# Patient Record
Sex: Female | Born: 1940
Health system: Southern US, Community
[De-identification: ages and names within clinical notes are randomized; demographics above are authoritative.]

## PROBLEM LIST (undated history)

## (undated) DIAGNOSIS — G4733 Obstructive sleep apnea (adult) (pediatric): Secondary | ICD-10-CM

## (undated) DIAGNOSIS — I10 Essential (primary) hypertension: Secondary | ICD-10-CM

## (undated) DIAGNOSIS — K219 Gastro-esophageal reflux disease without esophagitis: Secondary | ICD-10-CM

## (undated) DIAGNOSIS — I4891 Unspecified atrial fibrillation: Secondary | ICD-10-CM

## (undated) DIAGNOSIS — I480 Paroxysmal atrial fibrillation: Secondary | ICD-10-CM

## (undated) DIAGNOSIS — K5790 Diverticulosis of intestine, part unspecified, without perforation or abscess without bleeding: Secondary | ICD-10-CM

## (undated) HISTORY — DX: Diverticulosis of intestine, part unspecified, without perforation or abscess without bleeding: K57.90

## (undated) HISTORY — PX: COLONOSCOPY: SHX174

## (undated) HISTORY — DX: Obstructive sleep apnea (adult) (pediatric): G47.33

## (undated) HISTORY — PX: ATRIAL FIBRILLATION ABLATION: EP1191

## (undated) HISTORY — DX: Gastro-esophageal reflux disease without esophagitis: K21.9

## (undated) HISTORY — DX: Essential (primary) hypertension: I10

## (undated) HISTORY — DX: Unspecified atrial fibrillation: I48.91

## (undated) HISTORY — DX: Paroxysmal atrial fibrillation: I48.0

---

## 1975-05-05 HISTORY — PX: CHOLECYSTECTOMY: SHX55

## 1983-05-05 HISTORY — PX: TOTAL ABDOMINAL HYSTERECTOMY: SHX209

## 2003-05-05 HISTORY — PX: COLON RESECTION: SHX5231

## 2016-11-18 ENCOUNTER — Encounter: Payer: Self-pay | Admitting: *Deleted

## 2016-12-02 ENCOUNTER — Encounter (INDEPENDENT_AMBULATORY_CARE_PROVIDER_SITE_OTHER): Payer: Self-pay

## 2016-12-02 ENCOUNTER — Ambulatory Visit (INDEPENDENT_AMBULATORY_CARE_PROVIDER_SITE_OTHER): Payer: Medicare PPO | Admitting: Internal Medicine

## 2016-12-02 ENCOUNTER — Encounter: Payer: Self-pay | Admitting: Nurse Practitioner

## 2016-12-02 ENCOUNTER — Encounter: Payer: Self-pay | Admitting: Internal Medicine

## 2016-12-02 VITALS — BP 138/82 | HR 69 | Ht 66.0 in | Wt 209.0 lb

## 2016-12-02 DIAGNOSIS — I480 Paroxysmal atrial fibrillation: Secondary | ICD-10-CM

## 2016-12-02 DIAGNOSIS — R002 Palpitations: Secondary | ICD-10-CM

## 2016-12-02 DIAGNOSIS — I10 Essential (primary) hypertension: Secondary | ICD-10-CM

## 2016-12-02 MED ORDER — METOPROLOL SUCCINATE ER 25 MG PO TB24: 25.0000 mg | ORAL_TABLET | Freq: Every day | ORAL | 3 refills | Status: DC

## 2016-12-02 NOTE — Progress Notes (Signed)
Electrophysiology Office Note   Date:  12/02/2016   ID:  Brenda DallasJean M. Dian [name], DOB 16-Mar-1941, MRN [id number]  PCP:  System, Pcp Not In  Primary Electrophysiologist: Hillis RangeJames Hermione Havlicek, MD    No chief complaint on file.    History of Present Illness: Brenda [name] is a 76 y.o. female who presents today for electrophysiology evaluation.   [place] recently [place] to be near her daughter and grandchildren.  She has a h/o afib and [facility].  She has done very well s/p AF ablation.  She has not had documented sustained afib.  She has been taken off of anticoagulation by her cardiologist in [place].  She has frequent palpitations which are short.  Prior monitoring has revealed only sinus with PACs.  She is quite concerned about having additional episodes of atrial fibrillation.  She presents to establish for long term cardiology management in our office.   Today, she denies symptoms of  chest pain, shortness of breath, orthopnea, PND, lower extremity edema, claudication, dizziness, presyncope, syncope, bleeding, or neurologic sequela. The patient is tolerating medications without difficulties and is otherwise without complaint today.    Past Medical History:  Diagnosis Date  . Diverticulosis   . GERD (gastroesophageal reflux disease)   . Hypertension   . OSA (obstructive sleep apnea)    severe  . Paroxysmal A-fib [facility](HCC)    [facility]   Past Surgical History:  Procedure Laterality Date  . ATRIAL [facility]BLATION     [employer]  . CHOLECYSTECTOMY  1977  . COLON RESECTION  2005   due to diverticulitis  . TOTAL ABDOMINAL HYSTERECTOMY  1985     Current Outpatient Prescriptions  Medication Sig Dispense Refill  . aspirin 81 MG tablet Take 81 mg by mouth daily.    . furosemide (LASIX) 20 MG tablet Take 20 mg by mouth daily as needed.    . loratadine (CLARITIN) 10 MG tablet Take 10 mg by mouth daily as needed for allergies.      Marland Kitchen. losartan (COZAAR) 25 MG tablet Take 25 mg by mouth 2 (two) times daily.    . metoprolol succinate (TOPROL-XL) 50 MG 24 hr tablet Take 25 mg by mouth 2 (two) times daily as needed. Take with or immediately following a meal.    . Naproxen Sodium (ALEVE) 220 MG CAPS Take by mouth. Uses as directed    . omeprazole (PRILOSEC) 20 MG capsule Take 20 mg by mouth daily.    . potassium citrate (UROCIT-K) 10 MEQ (1080 MG) SR tablet Take 10 mEq by mouth daily as needed (for numbness).     No current facility-administered medications for this visit.     Allergies:   Patient has no known allergies.   Social History:  The patient  reports that she does not drink alcohol or use drugs.   Family History:  The patient's  family history includes Parkinson's disease in her mother; Stroke in her mother.    ROS:  Please see the history of present illness.   All other systems are personally reviewed and negative.    PHYSICAL EXAM: VS:  BP 138/82   Pulse 69   Ht 5\' 6"  (1.676 m)   Wt 209 lb (94.8 kg)   SpO2 96%   BMI 33.73 kg/m  , BMI Body mass index is 33.73 kg/m. GEN: Well nourished, well developed, in no acute distress  HEENT: normal  Neck: no JVD,  carotid bruits, or masses Cardiac: RRR; no murmurs, rubs, or gallops,no edema  Respiratory:  clear to auscultation bilaterally, normal work of breathing GI: soft, nontender, nondistended, + BS MS: no deformity or atrophy  Skin: warm and dry  Neuro:  Strength and sensation are intact Psych: euthymic mood, full affect  EKG:  EKG is ordered today. The ekg ordered today is personally reviewed and shows low voltage, sinus 71 bpm, PR 174 msec, QRS 82 msec, Qtc 432 msec, anterior infarct pattern    Wt Readings from Last 3 Encounters:  12/02/16 209 lb (94.8 kg)      Other studies personally reviewed: Additional studies/ records that were reviewed today include: 88 pages from referring physician  Review of the above records today demonstrates: Echo  03/13/16 reveals EF 50-55%, mild MR, mild LA enlargement,  Nuclear stress test 12/12/14- EF 71%, no ischemia,  Event monitor 01/21/16- sinus with PACs,  Many symptomatic submissions corresponded to sinus rhythm   ASSESSMENT AND PLAN:  1.  Paroxysmal atrial fibrillation She has done very well post ablation She does have palpitations of unclear significance and is concerned about the possibility of further afib. Despite chads2vasc score of at least 3 (soon to be 4 with her age), her anticoagulation has been discontinued. I would advise long term monitoring with an implantable loop recorder to further evaluate/ characterize her palpitations post ablation and to better determine if she has afib.  If further afib is detected, then I would advise that she restart anticoagulation given her chads2vasc score.  Risks and benefits to implantable loop recorder placement were discussed in detail with the patient who wishes to proceed.    2. HTN Stable No change required today  Follow-up with me in 2 months  Current medicines are reviewed at length with the patient today.   The patient does not have concerns regarding her medicines.  The following changes were made today:  none    Signed, Hillis RangeJames Zachary Lovins, MD  12/02/2016 3:47 PM     [facility] [address] [place] [address] [phone] (office) [phone] (fax)

## 2016-12-02 NOTE — Patient Instructions (Addendum)
Medication Instructions:  DECREASE Toprol (Metoprolol) to 25 mg once daily   Labwork: None Ordered   Testing/Procedures: See letter   Follow-Up: Your physician recommends that you schedule a follow-up appointment in: 10 days for wound check   Your physician recommends that you schedule a follow-up appointment in: 2 months with Dr. Johney FrameAllred   If you need a refill on your cardiac medications before your next appointment, please call your pharmacy.   Thank you for choosing [facility]! Eligha BridegroomMichelle Swinyer, RN [phone]

## 2016-12-08 ENCOUNTER — Ambulatory Visit (HOSPITAL_COMMUNITY)
Admission: RE | Admit: 2016-12-08 | Discharge: 2016-12-08 | Disposition: A | Discharge status: Home / Self Care | Payer: Medicare PPO | Source: Ambulatory Visit | Attending: Internal Medicine | Admitting: Internal Medicine

## 2016-12-08 ENCOUNTER — Encounter (HOSPITAL_COMMUNITY)
Admission: RE | Disposition: A | Discharge status: Home / Self Care | Payer: Self-pay | Source: Ambulatory Visit | Attending: Internal Medicine

## 2016-12-08 ENCOUNTER — Encounter (HOSPITAL_COMMUNITY): Payer: Self-pay | Admitting: Internal Medicine

## 2016-12-08 DIAGNOSIS — G4733 Obstructive sleep apnea (adult) (pediatric): Secondary | ICD-10-CM | POA: Diagnosis not present

## 2016-12-08 DIAGNOSIS — I4891 Unspecified atrial fibrillation: Secondary | ICD-10-CM

## 2016-12-08 DIAGNOSIS — I480 Paroxysmal atrial fibrillation: Secondary | ICD-10-CM | POA: Diagnosis present

## 2016-12-08 DIAGNOSIS — I10 Essential (primary) hypertension: Secondary | ICD-10-CM | POA: Insufficient documentation

## 2016-12-08 DIAGNOSIS — K219 Gastro-esophageal reflux disease without esophagitis: Secondary | ICD-10-CM | POA: Insufficient documentation

## 2016-12-08 DIAGNOSIS — Z7982 Long term (current) use of aspirin: Secondary | ICD-10-CM | POA: Insufficient documentation

## 2016-12-08 HISTORY — PX: LOOP RECORDER INSERTION: EP1214

## 2016-12-08 SURGERY — LOOP RECORDER INSERTION

## 2016-12-08 MED ORDER — LIDOCAINE-EPINEPHRINE 1 %-1:100000 IJ SOLN
INTRAMUSCULAR | Status: AC
  Filled 2016-12-08: qty 1

## 2016-12-08 MED ORDER — LIDOCAINE-EPINEPHRINE 1 %-1:100000 IJ SOLN
INTRAMUSCULAR | Status: DC | PRN
  Administered 2016-12-08: 10 mL

## 2016-12-08 SURGICAL SUPPLY — 2 items
LOOP REVEAL LINQSYS (Prosthesis & Implant Heart) ×3 IMPLANT
PACK LOOP INSERTION (CUSTOM PROCEDURE TRAY) ×3 IMPLANT

## 2016-12-08 NOTE — Discharge Instructions (Signed)
Pt given discharge instructions verbally and in writing for loop recorder implant. Pt verbalizes understanding and denies further questions

## 2016-12-08 NOTE — Interval H&P Note (Signed)
History and Physical Interval Note:  12/08/2016 8:36 AM  Brenda [name]  has presented today for surgery, with the diagnosis of afib s/p ablation with subsequent recurrent palpitations.  The various methods of treatment have been discussed with the patient and family. After consideration of risks, benefits and other options for treatment, the patient has consented to  Procedure(s): LOOP RECORDER INSERTION (N/A) as a surgical intervention .  The patient's history has been reviewed, patient examined, no change in status, stable for surgery.  I have reviewed the patient's chart and labs.  Questions were answered to the patient's satisfaction.     Hillis RangeJames Zyonna Vardaman

## 2016-12-08 NOTE — H&P (View-Only) (Signed)
Electrophysiology Office Note   Date:  12/02/2016   ID:  Brenda [name], DOB 16-Mar-1941, MRN [id number]  PCP:  System, Pcp Not In  Primary Electrophysiologist: Hillis RangeJames Jedd Schulenburg, MD    No chief complaint on file.    History of Present Illness: Brenda [name] is a 76 y.o. female who presents today for electrophysiology evaluation.   [place] recently [place] to be near her daughter and grandchildren.  She has a h/o afib and [facility].  She has done very well s/p AF ablation.  She has not had documented sustained afib.  She has been taken off of anticoagulation by her cardiologist in [place].  She has frequent palpitations which are short.  Prior monitoring has revealed only sinus with PACs.  She is quite concerned about having additional episodes of atrial fibrillation.  She presents to establish for long term cardiology management in our office.   Today, she denies symptoms of  chest pain, shortness of breath, orthopnea, PND, lower extremity edema, claudication, dizziness, presyncope, syncope, bleeding, or neurologic sequela. The patient is tolerating medications without difficulties and is otherwise without complaint today.    Past Medical History:  Diagnosis Date  . Diverticulosis   . GERD (gastroesophageal reflux disease)   . Hypertension   . OSA (obstructive sleep apnea)    severe  . Paroxysmal A-fib [facility](HCC)    [facility]   Past Surgical History:  Procedure Laterality Date  . ATRIAL FIBRILLATION [facility]BLATION     [employer]  . CHOLECYSTECTOMY  1977  . COLON RESECTION  2005   due to diverticulitis  . TOTAL ABDOMINAL HYSTERECTOMY  1985     Current Outpatient Prescriptions  Medication Sig Dispense Refill  . aspirin 81 MG tablet Take 81 mg by mouth daily.    . furosemide (LASIX) 20 MG tablet Take 20 mg by mouth daily as needed.    . loratadine (CLARITIN) 10 MG tablet Take 10 mg by mouth daily as needed for allergies.      Marland Kitchen. losartan (COZAAR) 25 MG tablet Take 25 mg by mouth 2 (two) times daily.    . metoprolol succinate (TOPROL-XL) 50 MG 24 hr tablet Take 25 mg by mouth 2 (two) times daily as needed. Take with or immediately following a meal.    . Naproxen Sodium (ALEVE) 220 MG CAPS Take by mouth. Uses as directed    . omeprazole (PRILOSEC) 20 MG capsule Take 20 mg by mouth daily.    . potassium citrate (UROCIT-K) 10 MEQ (1080 MG) SR tablet Take 10 mEq by mouth daily as needed (for numbness).     No current facility-administered medications for this visit.     Allergies:   Patient has no known allergies.   Social History:  The patient  reports that she does not drink alcohol or use drugs.   Family History:  The patient's  family history includes Parkinson's disease in her mother; Stroke in her mother.    ROS:  Please see the history of present illness.   All other systems are personally reviewed and negative.    PHYSICAL EXAM: VS:  BP 138/82   Pulse 69   Ht 5\' 6"  (1.676 m)   Wt 209 lb (94.8 kg)   SpO2 96%   BMI 33.73 kg/m  , BMI Body mass index is 33.73 kg/m. GEN: Well nourished, well developed, in no acute distress  HEENT: normal  Neck: no JVD,  carotid bruits, or masses Cardiac: RRR; no murmurs, rubs, or gallops,no edema  Respiratory:  clear to auscultation bilaterally, normal work of breathing GI: soft, nontender, nondistended, + BS MS: no deformity or atrophy  Skin: warm and dry  Neuro:  Strength and sensation are intact Psych: euthymic mood, full affect  EKG:  EKG is ordered today. The ekg ordered today is personally reviewed and shows low voltage, sinus 71 bpm, PR 174 msec, QRS 82 msec, Qtc 432 msec, anterior infarct pattern    Wt Readings from Last 3 Encounters:  12/02/16 209 lb (94.8 kg)      Other studies personally reviewed: Additional studies/ records that were reviewed today include: 88 pages from referring physician  Review of the above records today demonstrates: Echo  03/13/16 reveals EF 50-55%, mild MR, mild LA enlargement,  Nuclear stress test 12/12/14- EF 71%, no ischemia,  Event monitor 01/21/16- sinus with PACs,  Many symptomatic submissions corresponded to sinus rhythm   ASSESSMENT AND PLAN:  1.  Paroxysmal atrial fibrillation She has done very well post ablation She does have palpitations of unclear significance and is concerned about the possibility of further afib. Despite chads2vasc score of at least 3 (soon to be 4 with her age), her anticoagulation has been discontinued. I would advise long term monitoring with an implantable loop recorder to further evaluate/ characterize her palpitations post ablation and to better determine if she has afib.  If further afib is detected, then I would advise that she restart anticoagulation given her chads2vasc score.  Risks and benefits to implantable loop recorder placement were discussed in detail with the patient who wishes to proceed.    2. HTN Stable No change required today  Follow-up with me in 2 months  Current medicines are reviewed at length with the patient today.   The patient does not have concerns regarding her medicines.  The following changes were made today:  none    Signed, Hillis RangeJames Lunabella Badgett, MD  12/02/2016 3:47 PM     [facility] [address] [place] [address] [phone] (office) [phone] (fax)

## 2016-12-21 ENCOUNTER — Ambulatory Visit (INDEPENDENT_AMBULATORY_CARE_PROVIDER_SITE_OTHER): Payer: Self-pay | Admitting: *Deleted

## 2016-12-21 DIAGNOSIS — R002 Palpitations: Secondary | ICD-10-CM

## 2016-12-22 LAB — CUP PACEART INCLINIC DEVICE CHECK
Implantable Pulse Generator Implant Date: 20180807
MDC IDC SESS DTM: 20180820195900

## 2016-12-22 NOTE — Progress Notes (Signed)
Wound check appointment. Steri-strips removed. Wound without redness or edema. Incision edges approximated, wound well healed. Battery status: GOOD. R-waves 0.41mV. 0 symptom episodes, 0 tachy episodes, 0 pause episodes, 0 brady episodes. 0 AF episodes (0% burden). Patient educated about wound care, symptom activator, and Carelink monitor. Monthly summary reports and ROV with JA 02/08/17.

## 2017-01-06 ENCOUNTER — Ambulatory Visit (INDEPENDENT_AMBULATORY_CARE_PROVIDER_SITE_OTHER): Payer: Medicare PPO | Admitting: Family Medicine

## 2017-01-06 ENCOUNTER — Encounter: Payer: Self-pay | Admitting: Family Medicine

## 2017-01-06 VITALS — BP 130/70 | HR 64 | Ht 65.0 in | Wt 207.0 lb

## 2017-01-06 DIAGNOSIS — I10 Essential (primary) hypertension: Secondary | ICD-10-CM

## 2017-01-06 DIAGNOSIS — K5790 Diverticulosis of intestine, part unspecified, without perforation or abscess without bleeding: Secondary | ICD-10-CM | POA: Insufficient documentation

## 2017-01-06 DIAGNOSIS — K219 Gastro-esophageal reflux disease without esophagitis: Secondary | ICD-10-CM | POA: Diagnosis not present

## 2017-01-06 DIAGNOSIS — Z13220 Encounter for screening for lipoid disorders: Secondary | ICD-10-CM

## 2017-01-06 DIAGNOSIS — Z0001 Encounter for general adult medical examination with abnormal findings: Secondary | ICD-10-CM

## 2017-01-06 DIAGNOSIS — G4733 Obstructive sleep apnea (adult) (pediatric): Secondary | ICD-10-CM | POA: Diagnosis not present

## 2017-01-06 DIAGNOSIS — E66811 Obesity, class 1: Secondary | ICD-10-CM

## 2017-01-06 DIAGNOSIS — Z0000 Encounter for general adult medical examination without abnormal findings: Secondary | ICD-10-CM | POA: Diagnosis not present

## 2017-01-06 DIAGNOSIS — E669 Obesity, unspecified: Secondary | ICD-10-CM

## 2017-01-06 DIAGNOSIS — I480 Paroxysmal atrial fibrillation: Secondary | ICD-10-CM

## 2017-01-06 LAB — COMPREHENSIVE METABOLIC PANEL
ALT: 21 U/L (ref 0–35)
AST: 19 U/L (ref 0–37)
Albumin: 4.2 g/dL (ref 3.5–5.2)
Alkaline Phosphatase: 77 U/L (ref 39–117)
BUN: 8 mg/dL (ref 6–23)
CALCIUM: 9.7 mg/dL (ref 8.4–10.5)
CO2: 26 meq/L (ref 19–32)
CREATININE: 0.87 mg/dL (ref 0.40–1.20)
Chloride: 105 mEq/L (ref 96–112)
GFR: 67.53 mL/min (ref >60.00)
Glucose, Bld: 90 mg/dL (ref 70–99)
Potassium: 4.3 mEq/L (ref 3.5–5.1)
SODIUM: 141 meq/L (ref 135–145)
Total Bilirubin: 0.5 mg/dL (ref 0.2–1.2)
Total Protein: 6.6 g/dL (ref 6.0–8.3)

## 2017-01-06 LAB — LIPID PANEL
CHOL/HDL RATIO: 4
Cholesterol: 207 mg/dL — ABNORMAL HIGH (ref 0–200)
HDL: 55.4 mg/dL (ref >39.00)
LDL Cholesterol: 125 mg/dL — ABNORMAL HIGH (ref 0–99)
NONHDL: 151.43
Triglycerides: 130 mg/dL (ref 0.0–149.0)
VLDL: 26 mg/dL (ref 0.0–40.0)

## 2017-01-06 LAB — LDL CHOLESTEROL, DIRECT: Direct LDL: 116 mg/dL

## 2017-01-06 LAB — CBC
HEMATOCRIT: 42.7 % (ref 36.0–46.0)
Hemoglobin: 14 g/dL (ref 12.0–15.0)
MCHC: 32.7 g/dL (ref 30.0–36.0)
MCV: 93.1 fl (ref 78.0–100.0)
PLATELETS: 216 10*3/uL (ref 150.0–400.0)
RBC: 4.58 Mil/uL (ref 3.87–5.11)
RDW: 13.2 % (ref 11.5–15.5)
WBC: 6.3 10*3/uL (ref 4.0–10.5)

## 2017-01-06 LAB — TSH: TSH: 3.41 u[IU]/mL (ref 0.35–4.50)

## 2017-01-06 LAB — HEMOGLOBIN A1C: HEMOGLOBIN A1C: 5.7 % (ref 4.6–6.5)

## 2017-01-06 NOTE — Assessment & Plan Note (Addendum)
Continue CPAP.  

## 2017-01-06 NOTE — Progress Notes (Signed)
Subjective:  Brenda [name] is a 76 y.o. female who presents today for her annual comprehensive physical exam and to establish care  HPI:  No acute complaints today. Her chronic medical conditions are summarized below:  Afib Chronic. Managed by cardiology. Underwent ablation 7 years ago. Currently has loop recorder in placed.  HTN Chronic. Well controlled. On losartan, lasix, and metoprolol.  OSA Diagnosed last year. Doing well on CPAP.  GERD Chronic. Stable on prilosec 10mg  daily.  Lifestyle Diet: Balanced. Plenty of fruits and vegetables.  Exercise: Walks daily. 12 miles a week.   Depression screen PHQ 2/9 01/06/2017  Decreased Interest 0  Down, Depressed, Hopeless 0  PHQ - 2 Score 0  Altered sleeping 0  Tired, decreased energy 1  Change in appetite 0  Feeling bad or failure about yourself  0  Trouble concentrating 0  Moving slowly or fidgety/restless 0  Suicidal thoughts 0  PHQ-9 Score 1  Difficult doing work/chores Not difficult at all   Health Maintenance Due  Topic Date Due  . TETANUS/TDAP  05/20/1961  . MAMMOGRAM  05/20/1992  . COLONOSCOPY  05/20/1992  . DEXA SCAN  05/21/2007  . PNA vac Low Risk Adult (1 of 2 - PCV13) 05/21/2007  . INFLUENZA VACCINE  12/02/2016     ROS: Occasional fatigue, otherwise a 14 point review of systems was performed and was negative  PMH:  The following were reviewed and entered/updated in epic: Past Medical History:  Diagnosis Date  . Diverticulosis   . GERD (gastroesophageal reflux disease)   . Hypertension   . OSA (obstructive sleep apnea)    severe  . Paroxysmal A-fib [facility](HCC)    [facility]   Patient Active Problem List   Diagnosis Date Noted  . Obesity (BMI 30.0-34.9) 01/06/2017  . Paroxysmal A-fib (HCC)   . OSA (obstructive sleep apnea)   . Hypertension   . GERD (gastroesophageal reflux disease)   . Diverticulosis    Past Surgical History:  Procedure Laterality Date  . ATRIAL FIBRILLATION  ABLATION     [employer]  . CHOLECYSTECTOMY  1977  . COLON RESECTION  2005   due to diverticulitis  . LOOP RECORDER INSERTION N/A 12/08/2016   Procedure: LOOP RECORDER INSERTION;  Surgeon: Hillis RangeAllred, James, MD;  Location: [facility];  Service: Cardiovascular;  Laterality: N/A;  . TOTAL ABDOMINAL HYSTERECTOMY  1985    Family History  Problem Relation Age of Onset  . Stroke Mother   . Parkinson's disease Mother     Medications- reviewed and updated Current Outpatient Prescriptions  Medication Sig Dispense Refill  . aspirin 81 MG tablet Take 81 mg by mouth daily.    . furosemide (LASIX) 20 MG tablet Take 20 mg by mouth daily as needed for fluid.     Marland Kitchen. ibuprofen (ADVIL,MOTRIN) 200 MG tablet Take 200 mg by mouth every 8 (eight) hours as needed for headache.    . loratadine (CLARITIN) 10 MG tablet Take 10 mg by mouth daily.     Marland Kitchen. losartan (COZAAR) 25 MG tablet Take 25 mg by mouth 2 (two) times daily.    . metoprolol succinate (TOPROL XL) 25 MG 24 hr tablet Take 1 tablet (25 mg total) by mouth daily. 90 tablet 3  . Naproxen Sodium (ALEVE) 220 MG CAPS Take 220 mg by mouth daily as needed (pain). Uses as directed     . omeprazole (PRILOSEC) 20 MG capsule Take 20 mg by mouth daily.    .Marland Kitchen  potassium citrate (UROCIT-K) 10 MEQ (1080 MG) SR tablet Take 10 mEq by mouth daily as needed (for numbness).     No current facility-administered medications for this visit.     Allergies-reviewed and updated No Known Allergies  Social History   Social History  . Marital status: Divorced    Spouse name: N/A  . Number of children: N/A  . Years of education: N/A   Social History Main Topics  . Smoking status: Not on file  . Smokeless tobacco: Not on file  . Alcohol use No  . Drug use: No  . Sexual activity: Not on file   Other Topics Concern  . Not on file   Social History Narrative   Recently moved to [place] from [place]   Retired Tourist information centre manager   Objective:  Physical  Exam: BP 130/70 (BP Location: Left Arm, Patient Position: Sitting, Cuff Size: Normal)   Pulse 64   Ht 5\' 5"  (1.651 m)   Wt 207 lb (93.9 kg)   SpO2 97%   BMI 34.45 kg/m   Body mass index is 34.45 kg/m. Gen: NAD, resting comfortably CV: RRR with no murmurs appreciated Pulm: NWOB, CTAB with no crackles, wheezes, or rhonchi GI: Normal bowel sounds present. Soft, Nontender, Nondistended. MSK: no edema, cyanosis, or clubbing noted Skin: warm, dry Neuro: grossly normal, moves all extremities Psych: Normal affect and thought content  Assessment/Plan:  Paroxysmal A-fib (HCC) Regular today. Continue ASA 81mg  and metoprolol. Defer further management to cardiology.   Hypertension At goal. Continue lasix, metoprolol, and losartan. Check CMET. Check A1c for diabetes screening.   OSA (obstructive sleep apnea) Continue CPAP.   GERD (gastroesophageal reflux disease) Continue omeprazole. Consider switching to H2 blocker in the future if continues to be well controlled on PPI.   Obesity (BMI 30.0-34.9) Encouraged exercise and a healthy diet. Will check lipid panel, A1c, and TSH today.   Preventative Healthcare: Patient declined flu shot today. Reports she had a normal colonoscopy 2 years ago and a normal mammogram last year. She is unsure if she has had a pneumonia vaccine or not. Also defers DEXA scan today. Will obtain prior records and update our EMR as needed. -Check lipid panel today.   Patient Counseling:  -Nutrition: Stressed importance of moderation in sodium/caffeine intake, saturated fat and cholesterol, caloric balance, sufficient intake of fresh fruits, vegetables, and fiber.  -Stressed the importance of regular exercise.   -Substance Abuse: Discussed cessation/primary prevention of tobacco, alcohol, or other drug use; driving or other dangerous activities under the influence; availability of treatment for abuse.   -Injury prevention: Discussed safety belts, safety helmets, smoke  detector, smoking near bedding or upholstery.   -Sexuality: Discussed sexually transmitted diseases, partner selection, use of condoms, avoidance of unintended pregnancy and contraceptive alternatives.   -Dental health: Discussed importance of regular tooth brushing, flossing, and dental visits.  -Health maintenance and immunizations reviewed. Please refer to Health maintenance section.  Return to care in 1 year for next preventative visit.   Katina Degree. Jimmey Ralph, MD 01/06/2017 10:33 AM

## 2017-01-06 NOTE — Patient Instructions (Signed)
Preventive Care 65 Years and Older, Female Preventive care refers to lifestyle choices and visits with your health care provider that can promote health and wellness. What does preventive care include?  A yearly physical exam. This is also called an annual well check.  Dental exams once or twice a year.  Routine eye exams. Ask your health care provider how often you should have your eyes checked.  Personal lifestyle choices, including: ? Daily care of your teeth and gums. ? Regular physical activity. ? Eating a healthy diet. ? Avoiding tobacco and drug use. ? Limiting alcohol use. ? Practicing safe sex. ? Taking low-dose aspirin every day. ? Taking vitamin and mineral supplements as recommended by your health care provider. What happens during an annual well check? The services and screenings done by your health care provider during your annual well check will depend on your age, overall health, lifestyle risk factors, and family history of disease. Counseling Your health care provider may ask you questions about your:  Alcohol use.  Tobacco use.  Drug use.  Emotional well-being.  Home and relationship well-being.  Sexual activity.  Eating habits.  History of falls.  Memory and ability to understand (cognition).  Work and work environment.  Reproductive health.  Screening You may have the following tests or measurements:  Height, weight, and BMI.  Blood pressure.  Lipid and cholesterol levels. These may be checked every 5 years, or more frequently if you are over 50 years old.  Skin check.  Lung cancer screening. You may have this screening every year starting at age 55 if you have a 30-pack-year history of smoking and currently smoke or have quit within the past 15 years.  Fecal occult blood test (FOBT) of the stool. You may have this test every year starting at age 50.  Flexible sigmoidoscopy or colonoscopy. You may have a sigmoidoscopy every 5 years or  a colonoscopy every 10 years starting at age 50.  Hepatitis C blood test.  Hepatitis B blood test.  Sexually transmitted disease (STD) testing.  Diabetes screening. This is done by checking your blood sugar (glucose) after you have not eaten for a while (fasting). You may have this done every 1-3 years.  Bone density scan. This is done to screen for osteoporosis. You may have this done starting at age 65.  Mammogram. This may be done every 1-2 years. Talk to your health care provider about how often you should have regular mammograms.  Talk with your health care provider about your test results, treatment options, and if necessary, the need for more tests. Vaccines Your health care provider may recommend certain vaccines, such as:  Influenza vaccine. This is recommended every year.  Tetanus, diphtheria, and acellular pertussis (Tdap, Td) vaccine. You may need a Td booster every 10 years.  Varicella vaccine. You may need this if you have not been vaccinated.  Zoster vaccine. You may need this after age 60.  Measles, mumps, and rubella (MMR) vaccine. You may need at least one dose of MMR if you were born in 1957 or later. You may also need a second dose.  Pneumococcal 13-valent conjugate (PCV13) vaccine. One dose is recommended after age 65.  Pneumococcal polysaccharide (PPSV23) vaccine. One dose is recommended after age 65.  Meningococcal vaccine. You may need this if you have certain conditions.  Hepatitis A vaccine. You may need this if you have certain conditions or if you travel or work in places where you may be exposed to hepatitis   A.  Hepatitis B vaccine. You may need this if you have certain conditions or if you travel or work in places where you may be exposed to hepatitis B.  Haemophilus influenzae type b (Hib) vaccine. You may need this if you have certain conditions.  Talk to your health care provider about which screenings and vaccines you need and how often you  need them. This information is not intended to replace advice given to you by your health care provider. Make sure you discuss any questions you have with your health care provider. Document Released: 05/17/2015 Document Revised: 01/08/2016 Document Reviewed: 02/19/2015 Elsevier Interactive Patient Education  2017 [employer].

## 2017-01-06 NOTE — Assessment & Plan Note (Addendum)
Encouraged exercise and a healthy diet. Will check lipid panel, A1c, and TSH today.

## 2017-01-06 NOTE — Assessment & Plan Note (Signed)
At goal. Continue lasix, metoprolol, and losartan. Check CMET. Check A1c for diabetes screening.

## 2017-01-06 NOTE — Assessment & Plan Note (Signed)
Regular today. Continue ASA 81mg  and metoprolol. Defer further management to cardiology.

## 2017-01-06 NOTE — Assessment & Plan Note (Signed)
Continue omeprazole. Consider switching to H2 blocker in the future if continues to be well controlled on PPI.

## 2017-01-07 ENCOUNTER — Ambulatory Visit (INDEPENDENT_AMBULATORY_CARE_PROVIDER_SITE_OTHER): Payer: Medicare PPO | Admitting: *Deleted

## 2017-01-07 ENCOUNTER — Telehealth: Payer: Self-pay | Admitting: Family Medicine

## 2017-01-07 DIAGNOSIS — I480 Paroxysmal atrial fibrillation: Secondary | ICD-10-CM | POA: Diagnosis not present

## 2017-01-07 MED ORDER — ATORVASTATIN CALCIUM 40 MG PO TABS: 40.0000 mg | ORAL_TABLET | Freq: Every day | ORAL | 3 refills | Status: DC

## 2017-01-07 NOTE — Telephone Encounter (Signed)
Patient does want to have something sent into the pharmacy for her high cholesterol that she was notified of in her lab results.  [facility] [id number] - [place] [id number] [facility] [address] & [facility] [address] [phone] (Phone) [phone] (Fax)   Call patient to advise with any further questions.

## 2017-01-07 NOTE — Telephone Encounter (Signed)
Please advise 

## 2017-01-07 NOTE — Telephone Encounter (Signed)
Rx for atorvastatin sent in.  Katina Degreealeb M. Jimmey RalphParker, MD 01/07/2017 2:57 PM

## 2017-01-07 NOTE — Telephone Encounter (Signed)
Pt is aware of annotations.  

## 2017-01-08 NOTE — Progress Notes (Signed)
Carelink Summary Report / Loop Recorder 

## 2017-01-12 LAB — CUP PACEART REMOTE DEVICE CHECK
Date Time Interrogation Session: 20180906121016
Implantable Pulse Generator Implant Date: 20180807

## 2017-01-19 ENCOUNTER — Telehealth: Payer: Self-pay | Admitting: Family Medicine

## 2017-01-19 NOTE — Telephone Encounter (Signed)
ROI faxed to Dr.Mathew [employer]

## 2017-01-28 NOTE — Telephone Encounter (Signed)
Rec'd from Dr. Karalee Height forwarded 4 pages to Ardith Dark MD

## 2017-02-08 ENCOUNTER — Encounter: Payer: Self-pay | Admitting: Internal Medicine

## 2017-02-08 ENCOUNTER — Ambulatory Visit (INDEPENDENT_AMBULATORY_CARE_PROVIDER_SITE_OTHER): Payer: Medicare PPO | Admitting: Internal Medicine

## 2017-02-08 VITALS — BP 120/76 | HR 70 | Ht 65.5 in | Wt 204.0 lb

## 2017-02-08 DIAGNOSIS — I480 Paroxysmal atrial fibrillation: Secondary | ICD-10-CM

## 2017-02-08 DIAGNOSIS — I10 Essential (primary) hypertension: Secondary | ICD-10-CM

## 2017-02-08 DIAGNOSIS — R002 Palpitations: Secondary | ICD-10-CM

## 2017-02-08 NOTE — Patient Instructions (Signed)
Medication Instructions:  Your physician recommends that you continue on your current medications as directed. Please refer to the Current Medication list given to you today.   Labwork: None ordered   Testing/Procedures: None ordered   Follow-Up: Remote monitoring is used to monitor your Pacemaker from home. This monitoring reduces the number of office visits required to check your device to one time per year. It allows [place] to keep an eye on the functioning of your device to ensure it is working properly. You are scheduled for a device check from home on 02/09/17. You may send your transmission at any time that day. If you have a wireless device, the transmission will be sent automatically. After your physician reviews your transmission, you will receive a postcard with your next transmission date.  Your physician wants you to follow-up in: 12 months with Dr. Johney Frame. You will receive a reminder letter in the mail two months in advance. If you don't receive a letter, please call our office to schedule the follow-up appointment.   A

## 2017-02-08 NOTE — Progress Notes (Signed)
   PCP: Ardith Dark, MD   Primary EP: Dr Higinio Plan. Brenda [name] is a 76 y.o. female who presents today for routine electrophysiology followup.  Since last being seen in our clinic, the patient reports doing very well.  Rare palpitations due to PACs.  Today, she denies symptoms of chest pain, shortness of breath,  lower extremity edema, dizziness, presyncope, or syncope.  The patient is otherwise without complaint today.   Past Medical History:  Diagnosis Date  . Diverticulosis   . GERD (gastroesophageal reflux disease)   . Hypertension   . OSA (obstructive sleep apnea)    severe  . Paroxysmal A-fib [facility])    s/p afib ablation at [facility]   Past Surgical History:  Procedure Laterality Date  . ATRIAL FIBRILLATION ABLATION     [employer]  . CHOLECYSTECTOMY  1977  . COLON RESECTION  2005   due to diverticulitis  . LOOP RECORDER INSERTION N/A 12/08/2016   Procedure: LOOP RECORDER INSERTION;  Surgeon: Hillis Range, MD;  Location: [facility] INVASIVE CV LAB;  Service: Cardiovascular;  Laterality: N/A;  . TOTAL ABDOMINAL HYSTERECTOMY  1985    ROS- all systems are reviewed and negatives except as per HPI above  Current Outpatient Prescriptions  Medication Sig Dispense Refill  . aspirin 81 MG tablet Take 81 mg by mouth daily.    Marland Kitchen atorvastatin (LIPITOR) 40 MG tablet Take 1 tablet (40 mg total) by mouth daily. 90 tablet 3  . furosemide (LASIX) 20 MG tablet Take 20 mg by mouth daily as needed for fluid.     Marland Kitchen ibuprofen (ADVIL,MOTRIN) 200 MG tablet Take 200 mg by mouth every 8 (eight) hours as needed for headache.    . loratadine (CLARITIN) 10 MG tablet Take 10 mg by mouth daily.     Marland Kitchen losartan (COZAAR) 25 MG tablet Take 25 mg by mouth 2 (two) times daily.    . metoprolol succinate (TOPROL XL) 25 MG 24 hr tablet Take 1 tablet (25 mg total) by mouth daily. 90 tablet 3  . Naproxen Sodium (ALEVE) 220 MG CAPS Take 220 mg by mouth daily as needed (pain). Uses as directed     . omeprazole (PRILOSEC)  20 MG capsule Take 20 mg by mouth daily.     No current facility-administered medications for this visit.     Physical Exam: Vitals:   02/08/17 0953  BP: 120/76  Pulse: 70  SpO2: 97%  Weight: 204 lb (92.5 kg)  Height: 5' 5.5" (1.664 m)    GEN- The patient is well appearing, alert and oriented x 3 today.   Head- normocephalic, atraumatic Eyes-  Sclera clear, conjunctiva pink Ears- hearing intact Oropharynx- clear Lungs- Clear to ausculation bilaterally, normal work of breathing Heart- Regular rate and rhythm, no murmurs, rubs or gallops, PMI not laterally displaced GI- soft, NT, ND, + BS Extremities- no clubbing, cyanosis, or edema  ILR is reviewed today is personally reviewed and shows no afib  Assessment and Plan:  1. Paroxysmal atrial fibrillation Doing very well s/p ablation No afib by ILR interrogation today chads2vasc score is 3.  She is clear in her desire to remain off of anticoagulation.  She may be more willing to consider if she has afib in the future.  2. HTN Stable No change required today  Carelink Return to see me in a year  Hillis Range MD, [facility] 02/08/2017 10:24 AM

## 2017-02-09 ENCOUNTER — Ambulatory Visit (INDEPENDENT_AMBULATORY_CARE_PROVIDER_SITE_OTHER): Payer: Medicare PPO | Admitting: *Deleted

## 2017-02-09 DIAGNOSIS — I480 Paroxysmal atrial fibrillation: Secondary | ICD-10-CM

## 2017-02-09 LAB — CUP PACEART INCLINIC DEVICE CHECK
Implantable Pulse Generator Implant Date: 20180807
MDC IDC SESS DTM: 20181008141948

## 2017-02-09 NOTE — Progress Notes (Signed)
error 

## 2017-02-10 NOTE — Progress Notes (Signed)
Carelink Summary Report / Loop Recorder 

## 2017-02-11 LAB — CUP PACEART REMOTE DEVICE CHECK
Implantable Pulse Generator Implant Date: 20180807
MDC IDC SESS DTM: 20181006124202

## 2017-03-08 ENCOUNTER — Ambulatory Visit (INDEPENDENT_AMBULATORY_CARE_PROVIDER_SITE_OTHER): Payer: Medicare PPO | Admitting: *Deleted

## 2017-03-08 DIAGNOSIS — I480 Paroxysmal atrial fibrillation: Secondary | ICD-10-CM

## 2017-03-08 NOTE — Progress Notes (Signed)
Carelink Summary Report / Loop Recorder 

## 2017-03-11 LAB — CUP PACEART REMOTE DEVICE CHECK
MDC IDC PG IMPLANT DT: 20180807
MDC IDC SESS DTM: 20181105131401

## 2017-04-07 ENCOUNTER — Ambulatory Visit (INDEPENDENT_AMBULATORY_CARE_PROVIDER_SITE_OTHER): Payer: Medicare PPO | Admitting: *Deleted

## 2017-04-07 DIAGNOSIS — I480 Paroxysmal atrial fibrillation: Secondary | ICD-10-CM | POA: Diagnosis not present

## 2017-04-07 NOTE — Progress Notes (Signed)
Carelink Summary Report / Loop Recorder 

## 2017-04-16 LAB — CUP PACEART REMOTE DEVICE CHECK
Implantable Pulse Generator Implant Date: 20180807
MDC IDC SESS DTM: 20181205144228

## 2017-05-07 ENCOUNTER — Ambulatory Visit (INDEPENDENT_AMBULATORY_CARE_PROVIDER_SITE_OTHER): Payer: Medicare PPO | Admitting: *Deleted

## 2017-05-07 DIAGNOSIS — I480 Paroxysmal atrial fibrillation: Secondary | ICD-10-CM

## 2017-05-07 NOTE — Progress Notes (Signed)
Carelink Summary Report / Loop Recorder 

## 2017-05-19 LAB — CUP PACEART REMOTE DEVICE CHECK
Date Time Interrogation Session: 20190104144113
MDC IDC PG IMPLANT DT: 20180807

## 2017-05-30 DIAGNOSIS — G4733 Obstructive sleep apnea (adult) (pediatric): Secondary | ICD-10-CM | POA: Diagnosis not present

## 2017-06-07 ENCOUNTER — Ambulatory Visit (INDEPENDENT_AMBULATORY_CARE_PROVIDER_SITE_OTHER): Payer: Medicare PPO | Admitting: *Deleted

## 2017-06-07 DIAGNOSIS — I480 Paroxysmal atrial fibrillation: Secondary | ICD-10-CM | POA: Diagnosis not present

## 2017-06-07 NOTE — Progress Notes (Signed)
Carelink Summary Reprot / Loop Recorder 

## 2017-06-25 ENCOUNTER — Telehealth: Payer: Self-pay

## 2017-06-25 ENCOUNTER — Other Ambulatory Visit: Payer: Self-pay

## 2017-06-25 DIAGNOSIS — G4733 Obstructive sleep apnea (adult) (pediatric): Secondary | ICD-10-CM

## 2017-06-25 NOTE — Telephone Encounter (Signed)
Referral has been placed. 

## 2017-06-25 NOTE — Telephone Encounter (Signed)
Ok with me. Please place any necessary orders. 

## 2017-06-25 NOTE — Telephone Encounter (Signed)
Copied from CRM [id number]. Topic: Referral - Request >> Jun 25, 2017 12:44 PM Yvonna Alanisobinson, Andra M wrote: Reason for CRM: Patient has requested for Dr. Jimmey RalphParker to send a referral to [facility] for patient to be seen there for Severe Sleep Apnea. Patient wants to be seen in April 2019. [place] Fax # [phone].

## 2017-06-29 LAB — CUP PACEART REMOTE DEVICE CHECK
Date Time Interrogation Session: 20190203154139
Implantable Pulse Generator Implant Date: 20180807

## 2017-06-30 DIAGNOSIS — G4733 Obstructive sleep apnea (adult) (pediatric): Secondary | ICD-10-CM | POA: Diagnosis not present

## 2017-07-06 ENCOUNTER — Encounter: Payer: Self-pay | Admitting: Family Medicine

## 2017-07-06 ENCOUNTER — Ambulatory Visit: Payer: Medicare PPO | Admitting: Family Medicine

## 2017-07-06 DIAGNOSIS — I10 Essential (primary) hypertension: Secondary | ICD-10-CM | POA: Diagnosis not present

## 2017-07-06 DIAGNOSIS — K219 Gastro-esophageal reflux disease without esophagitis: Secondary | ICD-10-CM | POA: Diagnosis not present

## 2017-07-06 DIAGNOSIS — M1990 Unspecified osteoarthritis, unspecified site: Secondary | ICD-10-CM | POA: Diagnosis not present

## 2017-07-06 MED ORDER — RANITIDINE HCL 150 MG PO TABS: 150.0000 mg | ORAL_TABLET | Freq: Two times a day (BID) | ORAL | 3 refills | Status: DC

## 2017-07-06 NOTE — Assessment & Plan Note (Signed)
Stop omeprazole.  Start ranitidine.  Discussed long-term consequences of chronic PPI use.

## 2017-07-06 NOTE — Assessment & Plan Note (Signed)
Borderline low today.  Stop losartan.  Continue Lasix 20 mg daily as well as metoprolol 25 mg daily.  Follow-up in 6 months.  Discussed home blood pressure monitoring with goal 140/90 or lower.

## 2017-07-06 NOTE — Patient Instructions (Signed)
You have arthritis in your knees and hips.  Please use Aleve as needed.  Please see physical therapy.  I can put in referral if you need to.  Sometimes we can do injections to help with your pain.  Please let us know if you would like to have this set up.  Stop losartan.  Please keep an eye on your blood pressure.  If it is persistently 140/90 or higher please let us know.  Stop the omeprazole.  I would like for you to start ranitidine 150 mg twice daily.  Let us know if this does not work for you.  We can restart the omeprazole.  Please come back to see me in 6 months, or sooner as needed.  Take care, Dr. Jimmey RalphParker

## 2017-07-06 NOTE — Progress Notes (Signed)
    Subjective:  Brenda [name] is a 77 y.o. female who presents today with a chief complaint of HTN follow up.   HPI:  HTN, Established Problem, Stable Current medications include losartan 25 mg daily, Lasix 20 mg daily, and metoprolol succinate 25 mg daily.  She is compliant with all his medications.  Home blood pressure readings are in the systolics 110s to 120s.  No chest pain or shortness of breath.  BP Readings from Last 3 Encounters:  07/06/17 106/74  02/08/17 120/76  01/06/17 130/70    GERD, established problem, stable On Prilosec 20 mg daily.  She has been on this for several years.  Symptoms are worse at night.  Lower extremity stiffness, new problem Several year history.  Worsened over last several weeks to months.  She has stiffness in both of her knees as well as her hips.  She occasionally takes Aleve which helps a little bit.  No pain.  No swelling.  No obvious alleviating or aggravating factors.  ROS: Per HPI  PMH: She reports that she has quit smoking. she has never used smokeless tobacco. She reports that she does not drink alcohol or use drugs.   Objective:  Physical Exam: BP 106/74 (BP Location: Left Arm, Patient Position: Sitting, Cuff Size: Large)   Pulse 86   Temp 98.7 F (37.1 C) (Oral)   Ht 5' 5.5" (1.664 m)   Wt 205 lb 6.4 oz (93.2 kg)   SpO2 95%   BMI 33.66 kg/m   Gen: NAD, resting comfortably CV: RRR with no murmurs appreciated Pulm: NWOB, CTAB with no crackles, wheezes, or rhonchi MSK: -Right lower extremity.  Limited range of motion with internal and external rotation of hip.  Significant crepitus with passive range of motion at knee.  No effusions noted.  Joint line nontender.  Neurovascularly intact distally. -Left lower extremity: Limited range of motion with internal and external rotation of hip.  Crepitus noted with passive range of motion at knee.  No effusion.  No joint line tenderness.  Neurovascularly intact distally.  Assessment/Plan:   Hypertension Borderline low today.  Stop losartan.  Continue Lasix 20 mg daily as well as metoprolol 25 mg daily.  Follow-up in 6 months.  Discussed home blood pressure monitoring with goal 140/90 or lower.  GERD (gastroesophageal reflux disease) Stop omeprazole.  Start ranitidine.  Discussed long-term consequences of chronic PPI use.  Osteoarthritis Physical exam consistent with bilateral hip and knee OA.  Discussed treatment options.  Patient's daughter is a physical therapist and she will be following up with her to discuss specific exercises.  Continue NSAIDs as needed.  Consider intra-articular steroid injections of pain as  Brenda Larock M. Jimmey RalphParker, MD 07/06/2017 3:29 PM

## 2017-07-06 NOTE — Assessment & Plan Note (Signed)
Physical exam consistent with bilateral hip and knee OA.  Discussed treatment options.  Patient's daughter is a physical therapist and she will be following up with her to discuss specific exercises.  Continue NSAIDs as needed.  Consider intra-articular steroid injections of pain as

## 2017-07-09 ENCOUNTER — Ambulatory Visit (INDEPENDENT_AMBULATORY_CARE_PROVIDER_SITE_OTHER): Payer: Medicare PPO | Admitting: *Deleted

## 2017-07-09 DIAGNOSIS — I480 Paroxysmal atrial fibrillation: Secondary | ICD-10-CM

## 2017-07-09 NOTE — Progress Notes (Signed)
Carelink Summary Report / Loop Recorder 

## 2017-08-06 DIAGNOSIS — L718 Other rosacea: Secondary | ICD-10-CM | POA: Diagnosis not present

## 2017-08-06 DIAGNOSIS — L308 Other specified dermatitis: Secondary | ICD-10-CM | POA: Diagnosis not present

## 2017-08-11 ENCOUNTER — Ambulatory Visit (INDEPENDENT_AMBULATORY_CARE_PROVIDER_SITE_OTHER): Payer: Medicare PPO | Admitting: *Deleted

## 2017-08-11 DIAGNOSIS — I480 Paroxysmal atrial fibrillation: Secondary | ICD-10-CM | POA: Diagnosis not present

## 2017-08-12 NOTE — Progress Notes (Signed)
Carelink Summary Report / Loop Recorder 

## 2017-08-13 ENCOUNTER — Encounter: Payer: Self-pay | Admitting: Pulmonary Disease

## 2017-08-13 ENCOUNTER — Ambulatory Visit: Payer: Medicare PPO | Admitting: Pulmonary Disease

## 2017-08-13 VITALS — BP 136/80 | HR 85 | Ht 65.0 in | Wt 205.4 lb

## 2017-08-13 DIAGNOSIS — Z9989 Dependence on other enabling machines and devices: Secondary | ICD-10-CM | POA: Diagnosis not present

## 2017-08-13 DIAGNOSIS — G4733 Obstructive sleep apnea (adult) (pediatric): Secondary | ICD-10-CM

## 2017-08-13 DIAGNOSIS — R0609 Other forms of dyspnea: Secondary | ICD-10-CM

## 2017-08-13 NOTE — Progress Notes (Signed)
   Subjective:    Patient ID: Brenda DallasJean M. [name], female    DOB: 1941-04-18, 77 y.o.   MRN: [id number]  HPI    Review of Systems  Constitutional: Negative for fever and unexpected weight change.  HENT: Negative for congestion, dental problem, ear pain, nosebleeds, postnasal drip, rhinorrhea, sinus pressure, sneezing, sore throat and trouble swallowing.   Eyes: Negative for redness and itching.  Respiratory: Positive for cough and shortness of breath. Negative for chest tightness and wheezing.   Cardiovascular: Positive for palpitations. Negative for leg swelling.  Gastrointestinal: Negative for nausea and vomiting.  Genitourinary: Negative for dysuria.  Musculoskeletal: Negative for joint swelling.  Skin: Negative for rash.  Allergic/Immunologic: Negative.  Negative for environmental allergies, food allergies and immunocompromised state.  Neurological: Negative for headaches.  Hematological: Does not bruise/bleed easily.  Psychiatric/Behavioral: Negative for dysphoric mood. The patient is not nervous/anxious.        Objective:   Physical Exam        Assessment & Plan:

## 2017-08-13 NOTE — Progress Notes (Signed)
[facility]  Chief Complaint  Patient presents with  . sleep consult    establish new sleep doctor since moving    Vital signs: BP 136/80 (BP Location: Left Arm, Cuff Size: Normal)   Pulse 85   Ht 5\' 5"  (1.651 m)   Wt 205 lb 6.4 oz (93.2 kg)   SpO2 96%   BMI 34.18 kg/m   History of Present Illness: Brenda [name] is a 77 y.o. female for evaluation of sleep problems.  She has a history of atrial fibrillation.  She was living in [place] and followed by Cardiology.  There was concern that she snored and would stop breathing while asleep.  She eventually had a sleep study in 2017.  She was found to have severe sleep apnea and started on CPAP.  She has been doing well with this.  She has auto CPAP and full face mask.  She needs to get a new DME company.  She goes to sleep at 8 pm.  She falls asleep in 5 minutes.  She wakes up 1 or 2 times to use the bathroom.  She gets out of bed at 5 am.  She feels rested in the morning.  She denies morning headache.  She does not use anything to help her fall sleep or stay awake.  She denies sleep walking, sleep talking, bruxism, or nightmares.  There is no history of restless legs.  She denies sleep hallucinations, sleep paralysis, or cataplexy.  The Epworth score is 0 out of 24.  She gets sinus congestion in the morning, but then is better later in the day.  She exercised daily by walking for about 2 miles.  She still gets winded at times with activity.    Physical Exam:  General - pleasant Eyes - pupils reactive ENT - no sinus tenderness, no oral exudate, no LAN, MP 3, over bite Cardiac - regular, no murmur Chest - no wheeze, rales Abd - soft, non tender Ext - no edema Skin - no rashes Neuro - normal strength Psych - normal mood   Assessment/Plan:  Obstructive sleep apnea. - she is compliant with CPAP and reports benefit - will change her auto CPAP to 5 to 18 cm H2O  CPAP rhinitis. -  discussed environmental control techniques - she can try adjusting temperature on her humidifier  Dyspnea on exertion. - she is being assessed by cardiology - if cardiac assessment is unrevealing, then she might further pulmonary assessment   Patient Instructions  Will arrange for new CPAP supply company  Will change your auto CPAP range to 5 to 18 cm H2O  Follow up in 6 months    Coralyn HellingVineet Amya Hlad, MD [facility] 08/13/2017, 10:00 AM  Flow Sheet  Pulmonary tests:  Sleep tests: HST 04/14/16 >> AHI 39.1, SpO2 low 67% Auto CPAP 12/16/16 to 01/14/17 >> used on 29 of 30 nights with average 8 hrs 49 mins.  Average AHI 3.4 with median CPAP 13 and 95 th percentile CPAP 15 cm H2O  Cardiac tests: Echo 03/30/16 >> EF 50 to 55%, mild MR  Review of Systems: Constitutional: Negative for fever and unexpected weight change.  HENT: Negative for congestion, dental problem, ear pain, nosebleeds, postnasal drip, rhinorrhea, sinus pressure, sneezing, sore throat and trouble swallowing.   Eyes: Negative for redness and itching.  Respiratory: Positive for cough and shortness of breath. Negative for chest tightness and wheezing.   Cardiovascular: Positive for palpitations. Negative for leg swelling.  Gastrointestinal: Negative for nausea and vomiting.  Genitourinary: Negative for dysuria.  Musculoskeletal: Negative for joint swelling.  Skin: Negative for rash.  Allergic/Immunologic: Negative.  Negative for environmental allergies, food allergies and immunocompromised state.  Neurological: Negative for headaches.  Hematological: Does not bruise/bleed easily.  Psychiatric/Behavioral: Negative for dysphoric mood. The patient is not nervous/anxious.    Past Medical History: She  has a past medical history of Diverticulosis, GERD (gastroesophageal reflux disease), Hypertension, OSA (obstructive sleep apnea), and Paroxysmal A-fib (HCC).  Past Surgical History: She  has a past surgical  history that includes Colon resection (2005); Cholecystectomy (1977); Total abdominal hysterectomy (1985); ATRIAL FIBRILLATION ABLATION; and LOOP RECORDER INSERTION (N/A, 12/08/2016).  Family History: Her family history includes Parkinson's disease in her mother; Stroke in her mother.  Social History: She  reports that she quit smoking about 11 years ago. Her smoking use included cigarettes. She has never used smokeless tobacco. She reports that she does not drink alcohol or use drugs.  Medications: Allergies as of 08/13/2017   No Known Allergies     Medication List        Accurate as of 08/13/17 10:00 AM. Always use your most recent med list.          ALEVE 220 MG Caps Generic drug:  Naproxen Sodium Take 220 mg by mouth daily as needed (pain). Uses as directed   aspirin 81 MG tablet Take 81 mg by mouth daily.   atorvastatin 40 MG tablet Commonly known as:  LIPITOR Take 1 tablet (40 mg total) by mouth daily.   furosemide 20 MG tablet Commonly known as:  LASIX Take 20 mg by mouth daily as needed for fluid.   ibuprofen 200 MG tablet Commonly known as:  ADVIL,MOTRIN Take 200 mg by mouth every 8 (eight) hours as needed for headache.   loratadine 10 MG tablet Commonly known as:  CLARITIN Take 10 mg by mouth daily.   metoprolol succinate 25 MG 24 hr tablet Commonly known as:  TOPROL XL Take 1 tablet (25 mg total) by mouth daily.   ranitidine 150 MG tablet Commonly known as:  ZANTAC Take 1 tablet (150 mg total) by mouth 2 (two) times daily.

## 2017-08-13 NOTE — Patient Instructions (Signed)
Will arrange for new CPAP supply company  Will change your auto CPAP range to 5 to 18 cm H2O  Follow up in 6 months

## 2017-08-13 NOTE — Addendum Note (Signed)
Addended by: Pilar GrammesEYNOLDS, Nicole Hafley C on: 08/13/2017 10:13 AM   Modules accepted: Orders

## 2017-08-19 ENCOUNTER — Telehealth: Payer: Self-pay | Admitting: Pulmonary Disease

## 2017-08-19 DIAGNOSIS — G4733 Obstructive sleep apnea (adult) (pediatric): Secondary | ICD-10-CM

## 2017-08-19 LAB — CUP PACEART REMOTE DEVICE CHECK
Implantable Pulse Generator Implant Date: 20180807
MDC IDC SESS DTM: 20190308153615

## 2017-08-19 NOTE — Telephone Encounter (Signed)
lmtcb x 1 for Jason with  [facility] 

## 2017-08-23 NOTE — Telephone Encounter (Signed)
Barbara CowerJason, [facility] call. Cb is [phone] ext [phone].

## 2017-08-23 NOTE — Telephone Encounter (Signed)
Attempted to call Jason with [facility]. I did not receive an answer. I have left a message for Jason to return our call.  

## 2017-08-23 NOTE — Telephone Encounter (Signed)
CPAP supply order has been updated. Nothing further was needed.

## 2017-08-27 ENCOUNTER — Telehealth: Payer: Self-pay | Admitting: Pulmonary Disease

## 2017-08-27 DIAGNOSIS — G4733 Obstructive sleep apnea (adult) (pediatric): Secondary | ICD-10-CM

## 2017-08-27 NOTE — Telephone Encounter (Signed)
Spoke with pt, she states she is uncomfortable with [facility] needing a debit card on file at all times to get her CPAP serviced. She would like to switch DME companies and mentioned [facility]. I called them but they do not service CPAP, they only give supplies. I advised her that I would put an order in to see if we can switch DME companies but it depends on the insurance. Pt understood and wants us to call back to give her an update. Order placed PCC's please advise.

## 2017-08-27 NOTE — Telephone Encounter (Signed)
Order that has been put in to send to new dme states "cpap services and supplies".  A new order will need to be put in stating exactly what is needed.

## 2017-08-27 NOTE — Telephone Encounter (Signed)
New order placed for pcc's to switch homecare company.  Nothing further needed.

## 2017-09-08 DIAGNOSIS — G4733 Obstructive sleep apnea (adult) (pediatric): Secondary | ICD-10-CM | POA: Diagnosis not present

## 2017-09-09 DIAGNOSIS — G4733 Obstructive sleep apnea (adult) (pediatric): Secondary | ICD-10-CM | POA: Diagnosis not present

## 2017-09-10 DIAGNOSIS — H00021 Hordeolum internum right upper eyelid: Secondary | ICD-10-CM | POA: Diagnosis not present

## 2017-09-13 ENCOUNTER — Ambulatory Visit (INDEPENDENT_AMBULATORY_CARE_PROVIDER_SITE_OTHER): Payer: Medicare PPO | Admitting: *Deleted

## 2017-09-13 DIAGNOSIS — I480 Paroxysmal atrial fibrillation: Secondary | ICD-10-CM

## 2017-09-13 LAB — CUP PACEART REMOTE DEVICE CHECK
Date Time Interrogation Session: 20190410154151
Implantable Pulse Generator Implant Date: 20180807

## 2017-09-13 NOTE — Progress Notes (Signed)
Carelink Summary Report / Loop Recorder 

## 2017-09-17 DIAGNOSIS — H0011 Chalazion right upper eyelid: Secondary | ICD-10-CM | POA: Diagnosis not present

## 2017-09-17 DIAGNOSIS — H10412 Chronic giant papillary conjunctivitis, left eye: Secondary | ICD-10-CM | POA: Diagnosis not present

## 2017-09-17 DIAGNOSIS — H01024 Squamous blepharitis left upper eyelid: Secondary | ICD-10-CM | POA: Diagnosis not present

## 2017-09-17 DIAGNOSIS — H01021 Squamous blepharitis right upper eyelid: Secondary | ICD-10-CM | POA: Diagnosis not present

## 2017-09-28 DIAGNOSIS — H0011 Chalazion right upper eyelid: Secondary | ICD-10-CM | POA: Diagnosis not present

## 2017-10-06 LAB — CUP PACEART REMOTE DEVICE CHECK
Date Time Interrogation Session: 20190513161009
Implantable Pulse Generator Implant Date: 20180807

## 2017-10-16 DIAGNOSIS — J069 Acute upper respiratory infection, unspecified: Secondary | ICD-10-CM | POA: Diagnosis not present

## 2017-10-18 ENCOUNTER — Ambulatory Visit (INDEPENDENT_AMBULATORY_CARE_PROVIDER_SITE_OTHER): Payer: Medicare PPO | Admitting: *Deleted

## 2017-10-18 DIAGNOSIS — I480 Paroxysmal atrial fibrillation: Secondary | ICD-10-CM | POA: Diagnosis not present

## 2017-10-18 NOTE — Progress Notes (Signed)
Carelink Summary Report / Loop Recorder 

## 2017-10-26 ENCOUNTER — Telehealth: Payer: Self-pay

## 2017-10-26 NOTE — Telephone Encounter (Signed)
LMOVM reminding pt to send remote transmission.  Pt monitor has not updated in the past 14 days.

## 2017-11-02 ENCOUNTER — Other Ambulatory Visit: Payer: Self-pay | Admitting: Family Medicine

## 2017-11-02 DIAGNOSIS — Z1231 Encounter for screening mammogram for malignant neoplasm of breast: Secondary | ICD-10-CM

## 2017-11-17 DIAGNOSIS — H04123 Dry eye syndrome of bilateral lacrimal glands: Secondary | ICD-10-CM | POA: Diagnosis not present

## 2017-11-17 DIAGNOSIS — H2513 Age-related nuclear cataract, bilateral: Secondary | ICD-10-CM | POA: Diagnosis not present

## 2017-11-18 ENCOUNTER — Ambulatory Visit (INDEPENDENT_AMBULATORY_CARE_PROVIDER_SITE_OTHER): Payer: Medicare PPO | Admitting: *Deleted

## 2017-11-18 DIAGNOSIS — I480 Paroxysmal atrial fibrillation: Secondary | ICD-10-CM | POA: Diagnosis not present

## 2017-11-18 NOTE — Progress Notes (Signed)
Carelink Summary Report / Loop Recorder 

## 2017-11-20 LAB — CUP PACEART REMOTE DEVICE CHECK
Date Time Interrogation Session: 20190615163620
MDC IDC PG IMPLANT DT: 20180807

## 2017-12-07 ENCOUNTER — Ambulatory Visit
Admission: RE | Admit: 2017-12-07 | Discharge: 2017-12-07 | Disposition: A | Discharge status: Home / Self Care | Payer: Medicare PPO | Source: Ambulatory Visit | Attending: Family Medicine | Admitting: Family Medicine

## 2017-12-07 DIAGNOSIS — Z1231 Encounter for screening mammogram for malignant neoplasm of breast: Secondary | ICD-10-CM | POA: Diagnosis not present

## 2017-12-11 ENCOUNTER — Other Ambulatory Visit: Payer: Self-pay | Admitting: Internal Medicine

## 2017-12-21 ENCOUNTER — Ambulatory Visit (INDEPENDENT_AMBULATORY_CARE_PROVIDER_SITE_OTHER): Payer: Medicare PPO | Admitting: *Deleted

## 2017-12-21 DIAGNOSIS — I480 Paroxysmal atrial fibrillation: Secondary | ICD-10-CM

## 2017-12-22 NOTE — Progress Notes (Signed)
Carelink Summary Report / Loop Recorder 

## 2017-12-23 DIAGNOSIS — G4733 Obstructive sleep apnea (adult) (pediatric): Secondary | ICD-10-CM | POA: Diagnosis not present

## 2017-12-29 ENCOUNTER — Ambulatory Visit: Payer: Medicare PPO

## 2018-01-06 LAB — CUP PACEART REMOTE DEVICE CHECK
Implantable Pulse Generator Implant Date: 20180807
MDC IDC SESS DTM: 20190718173837

## 2018-01-09 DIAGNOSIS — S61011A Laceration without foreign body of right thumb without damage to nail, initial encounter: Secondary | ICD-10-CM | POA: Diagnosis not present

## 2018-01-18 ENCOUNTER — Other Ambulatory Visit: Payer: Self-pay | Admitting: Family Medicine

## 2018-01-24 ENCOUNTER — Ambulatory Visit (INDEPENDENT_AMBULATORY_CARE_PROVIDER_SITE_OTHER): Payer: Medicare PPO | Admitting: *Deleted

## 2018-01-24 DIAGNOSIS — I480 Paroxysmal atrial fibrillation: Secondary | ICD-10-CM | POA: Diagnosis not present

## 2018-01-24 LAB — CUP PACEART REMOTE DEVICE CHECK
Implantable Pulse Generator Implant Date: 20180807
MDC IDC SESS DTM: 20190820180736

## 2018-01-24 NOTE — Progress Notes (Signed)
Carelink Summary Report / Loop Recorder 

## 2018-01-31 LAB — CUP PACEART REMOTE DEVICE CHECK
Date Time Interrogation Session: 20190922183758
Implantable Pulse Generator Implant Date: 20180807

## 2018-02-23 ENCOUNTER — Ambulatory Visit (INDEPENDENT_AMBULATORY_CARE_PROVIDER_SITE_OTHER): Payer: Medicare PPO | Admitting: Internal Medicine

## 2018-02-23 ENCOUNTER — Encounter: Payer: Self-pay | Admitting: Internal Medicine

## 2018-02-23 VITALS — BP 112/70 | HR 76 | Ht 66.0 in | Wt 204.2 lb

## 2018-02-23 DIAGNOSIS — I480 Paroxysmal atrial fibrillation: Secondary | ICD-10-CM

## 2018-02-23 DIAGNOSIS — Q268 Other congenital malformations of great veins: Secondary | ICD-10-CM

## 2018-02-23 DIAGNOSIS — R079 Chest pain, unspecified: Secondary | ICD-10-CM | POA: Diagnosis not present

## 2018-02-23 DIAGNOSIS — I10 Essential (primary) hypertension: Secondary | ICD-10-CM | POA: Diagnosis not present

## 2018-02-23 DIAGNOSIS — R0602 Shortness of breath: Secondary | ICD-10-CM

## 2018-02-23 DIAGNOSIS — I288 Other diseases of pulmonary vessels: Secondary | ICD-10-CM

## 2018-02-23 LAB — CUP PACEART INCLINIC DEVICE CHECK
Implantable Pulse Generator Implant Date: 20180807
MDC IDC SESS DTM: 20191023132104

## 2018-02-23 NOTE — Patient Instructions (Addendum)
Medication Instructions:  Your physician recommends that you continue on your current medications as directed. Please refer to the Current Medication list given to you today.  If you need a refill on your cardiac medications before your next appointment, please call your pharmacy.   Lab work: You will get lab work today:  BMP  If you have labs (blood work) drawn today and your tests are completely normal, you will receive your results only by: Marland Kitchen MyChart Message (if you have MyChart) OR . A paper copy in the mail If you have any lab test that is abnormal or we need to change your treatment, we will call you to review the results.  Testing/Procedures: Your physician has requested that you have an echocardiogram. Echocardiography is a painless test that uses sound waves to create images of your heart. It provides your doctor with information about the size and shape of your heart and how well your heart's chambers and valves are working. This procedure takes approximately one hour. There are no restrictions for this procedure.  Please schedule for an ECHO  Your physician has recommended that you have a pulmonary function test. Pulmonary Function Tests are a group of tests that measure how well air moves in and out of your lungs.  Please schedule for PFT's  Your physician has requested that you have cardiac CT. Cardiac computed tomography (CT) is a painless test that uses an x-ray machine to take clear, detailed pictures of your heart. For further information please visit [email or web address] Please follow instruction sheet as given.  You will be called to schedule your cardiac CT when it has been approved by your insurance.   Follow-Up:  Your physician wants you to follow-up in: 6 weeks with Dr. Johney Frame.      At [facility], you and your health needs are our priority.  As part of our continuing mission to provide you with exceptional heart care, we have created designated Provider Care  Teams.  These Care Teams include your primary Cardiologist (physician) and Advanced Practice Providers (APPs -  Physician Assistants and Nurse Practitioners) who all work together to provide you with the care you need, when you need it.  Any Other Special Instructions Will Be Listed Below (If Applicable).   INSTRUCTIONS FOR CARDIAC CT:  Please arrive at the [facility] main entrance of [facility] at xx:xx AM (30-45 minutes prior to test start time)  [facility] [address] [place], [place] [address] [phone]  Proceed to the [facility] Radiology Department (First Floor).  Please follow these instructions carefully (unless otherwise directed):  On the Night Before the Test: . Be sure to Drink plenty of water. . Do not consume any caffeinated/decaffeinated beverages or chocolate 12 hours prior to your test. . Do not take any antihistamines 12 hours prior to your test.  On the Day of the Test: . Drink plenty of water. Do not drink any water within one hour of the test. . Do not eat any food 4 hours prior to the test. . You may take your regular medications prior to the test.  . Take metoprolol (Lopressor) two hours prior to test--TAKE 50 MG  . HOLD Furosemide morning of the test.      After the Test: . Drink plenty of water. . After receiving IV contrast, you may experience a mild flushed feeling. This is normal. . On occasion, you may experience a mild rash up to 24 hours after the test. This  is not dangerous. If this occurs, you can take Benadryl 25 mg and increase your fluid intake. . If you experience trouble breathing, this can be serious. If it is severe call 911 IMMEDIATELY. If it is mild, please call our office.

## 2018-02-23 NOTE — Progress Notes (Signed)
PCP: Ardith Dark, MD   Primary EP: Dr Higinio Plan. Brenda [name] is a 77 y.o. female who presents today for routine electrophysiology followup.  Since last being seen in our clinic, the patient reports doing reasonably well.  She continues to have short palpitations due to pacs, nonsustained atach.  She also has SOB with moderate activity as well as chest pain.  She is very concerned about these symptoms.   Today, she denies symptoms of lower extremity edema, dizziness, presyncope, or syncope.  The patient is otherwise without complaint today.   Past Medical History:  Diagnosis Date  . Diverticulosis   . GERD (gastroesophageal reflux disease)   . Hypertension   . OSA (obstructive sleep apnea)    severe  . Paroxysmal A-fib [facility])    s/p afib ablation at [facility]   Past Surgical History:  Procedure Laterality Date  . ATRIAL FIBRILLATION ABLATION     [employer]  . CHOLECYSTECTOMY  1977  . COLON RESECTION  2005   due to diverticulitis  . LOOP RECORDER INSERTION N/A 12/08/2016   Procedure: LOOP RECORDER INSERTION;  Surgeon: Hillis Range, MD;  [facility];  Service: Cardiovascular;  Laterality: N/A;  . TOTAL ABDOMINAL HYSTERECTOMY  1985    ROS- all systems are reviewed and negatives except as per HPI above  Current Outpatient Medications  Medication Sig Dispense Refill  . aspirin 81 MG tablet Take 81 mg by mouth daily.    Marland Kitchen atorvastatin (LIPITOR) 40 MG tablet TAKE 1 TABLET BY MOUTH DAILY 90 tablet 0  . furosemide (LASIX) 20 MG tablet Take 20 mg by mouth daily as needed for fluid.     Marland Kitchen ibuprofen (ADVIL,MOTRIN) 200 MG tablet Take 200 mg by mouth every 8 (eight) hours as needed for headache.    . loratadine (CLARITIN) 10 MG tablet Take 10 mg by mouth daily.     . metoprolol succinate (TOPROL-XL) 25 MG 24 hr tablet TAKE 1 TABLET BY MOUTH DAILY 90 tablet 0  . Naproxen Sodium (ALEVE) 220 MG CAPS Take 220 mg by mouth daily as needed (pain). Uses as directed     .  ranitidine (ZANTAC) 150 MG tablet TAKE 1 TABLET BY MOUTH TWICE DAILY 180 tablet 0   No current facility-administered medications for this visit.     Physical Exam: Vitals:   02/23/18 1137  BP: 112/70  Pulse: 76  SpO2: 95%  Weight: 204 lb 3.2 oz (92.6 kg)  Height: 5\' 6"  (1.676 m)    GEN- The patient is well appearing, alert and oriented x 3 today.   Head- normocephalic, atraumatic Eyes-  Sclera clear, conjunctiva pink Ears- hearing intact Oropharynx- clear Lungs- Clear to ausculation bilaterally, normal work of breathing Heart- Regular rate and rhythm, no murmurs, rubs or gallops, PMI not laterally displaced GI- soft, NT, ND, + BS Extremities- no clubbing, cyanosis, or edema  Wt Readings from Last 3 Encounters:  02/23/18 204 lb 3.2 oz (92.6 kg)  08/13/17 205 lb 6.4 oz (93.2 kg)  07/06/17 205 lb 6.4 oz (93.2 kg)    EKG tracing ordered today is personally reviewed and shows sinus rhythm with PACs, nonspecific ST/T changes  ILR reveals sinus with PACs, no afib  Assessment and Plan:  1. Paroxysmal atrial fibrillation Doing well s/p ablation No afib by ILR chads2vasc score is 4. She wishes to stay off of anticoagulation  2. HTN Stable No change required today  3. SOB/ CP Unclear etiology Will order PFTs  and and an echo to further evaluate Cardiac CT with FFR to evaluate for CAD or PV stenosis given prior ablation  [facility] Return in 6 weeks to follow-up on the above  Hillis Range MD, [facility] 02/23/2018 12:12 PM

## 2018-02-24 LAB — BASIC METABOLIC PANEL
BUN/Creatinine Ratio: 9 — ABNORMAL LOW (ref 12–28)
BUN: 10 mg/dL (ref 8–27)
CHLORIDE: 101 mmol/L (ref 96–106)
CO2: 24 mmol/L (ref 20–29)
Calcium: 9.6 mg/dL (ref 8.7–10.3)
Creatinine, Ser: 1.06 mg/dL — ABNORMAL HIGH (ref 0.57–1.00)
GFR calc Af Amer: 59 mL/min/{1.73_m2} — ABNORMAL LOW (ref >59)
GFR calc non Af Amer: 51 mL/min/{1.73_m2} — ABNORMAL LOW (ref >59)
Glucose: 91 mg/dL (ref 65–99)
Potassium: 3.6 mmol/L (ref 3.5–5.2)
Sodium: 143 mmol/L (ref 134–144)

## 2018-02-25 ENCOUNTER — Ambulatory Visit (INDEPENDENT_AMBULATORY_CARE_PROVIDER_SITE_OTHER): Payer: Medicare PPO | Admitting: *Deleted

## 2018-02-25 DIAGNOSIS — I480 Paroxysmal atrial fibrillation: Secondary | ICD-10-CM | POA: Diagnosis not present

## 2018-02-26 NOTE — Progress Notes (Signed)
Carelink Summary Report / Loop Recorder 

## 2018-03-03 ENCOUNTER — Ambulatory Visit (INDEPENDENT_AMBULATORY_CARE_PROVIDER_SITE_OTHER): Payer: Medicare PPO | Admitting: Pulmonary Disease

## 2018-03-03 ENCOUNTER — Other Ambulatory Visit: Payer: Self-pay

## 2018-03-03 ENCOUNTER — Ambulatory Visit (HOSPITAL_COMMUNITY): Payer: Medicare PPO | Attending: Cardiology

## 2018-03-03 DIAGNOSIS — R0602 Shortness of breath: Secondary | ICD-10-CM | POA: Diagnosis not present

## 2018-03-03 LAB — PULMONARY FUNCTION TEST
DL/VA % PRED: 88 %
DL/VA: 4.38 ml/min/mmHg/L
DLCO UNC: 21.57 ml/min/mmHg
DLCO unc % pred: 84 %
FEF 25-75 Post: 2.87 L/sec
FEF 25-75 Pre: 2.65 L/sec
FEF2575-%Change-Post: 8 %
FEF2575-%PRED-POST: 167 %
FEF2575-%Pred-Pre: 154 %
FEV1-%Change-Post: 1 %
FEV1-%PRED-PRE: 115 %
FEV1-%Pred-Post: 117 %
FEV1-Post: 2.58 L
FEV1-Pre: 2.54 L
FEV1FVC-%CHANGE-POST: -1 %
FEV1FVC-%PRED-PRE: 107 %
FEV6-%Change-Post: 3 %
FEV6-%Pred-Post: 116 %
FEV6-%Pred-Pre: 112 %
FEV6-Post: 3.26 L
FEV6-Pre: 3.14 L
FEV6FVC-%PRED-POST: 105 %
FEV6FVC-%Pred-Pre: 105 %
FVC-%Change-Post: 3 %
FVC-%PRED-POST: 110 %
FVC-%PRED-PRE: 106 %
FVC-POST: 3.26 L
FVC-PRE: 3.14 L
PRE FEV1/FVC RATIO: 81 %
Post FEV1/FVC ratio: 79 %
Post FEV6/FVC ratio: 100 %
Pre FEV6/FVC Ratio: 100 %
RV % pred: 91 %
RV: 2.14 L
TLC % pred: 104 %
TLC: 5.46 L

## 2018-03-03 NOTE — Progress Notes (Signed)
PFT done today. 

## 2018-03-10 ENCOUNTER — Other Ambulatory Visit: Payer: Self-pay | Admitting: Internal Medicine

## 2018-03-11 LAB — CUP PACEART REMOTE DEVICE CHECK
Date Time Interrogation Session: 20191025193714
MDC IDC PG IMPLANT DT: 20180807

## 2018-03-21 ENCOUNTER — Ambulatory Visit (HOSPITAL_COMMUNITY): Payer: Medicare PPO

## 2018-03-21 ENCOUNTER — Ambulatory Visit (HOSPITAL_COMMUNITY)
Admission: RE | Admit: 2018-03-21 | Discharge: 2018-03-21 | Disposition: A | Discharge status: Home / Self Care | Payer: Medicare PPO | Source: Ambulatory Visit | Attending: Internal Medicine | Admitting: Internal Medicine

## 2018-03-21 ENCOUNTER — Encounter (HOSPITAL_COMMUNITY): Payer: Self-pay

## 2018-03-21 DIAGNOSIS — I700 Atherosclerosis of aorta: Secondary | ICD-10-CM | POA: Insufficient documentation

## 2018-03-21 DIAGNOSIS — Q268 Other congenital malformations of great veins: Secondary | ICD-10-CM | POA: Insufficient documentation

## 2018-03-21 DIAGNOSIS — R0602 Shortness of breath: Secondary | ICD-10-CM

## 2018-03-21 DIAGNOSIS — R079 Chest pain, unspecified: Secondary | ICD-10-CM | POA: Diagnosis not present

## 2018-03-21 DIAGNOSIS — I712 Thoracic aortic aneurysm, without rupture: Secondary | ICD-10-CM | POA: Insufficient documentation

## 2018-03-21 DIAGNOSIS — I4891 Unspecified atrial fibrillation: Secondary | ICD-10-CM | POA: Diagnosis not present

## 2018-03-21 MED ORDER — IOPAMIDOL (ISOVUE-370) INJECTION 76%
100.0000 mL | Freq: Once | INTRAVENOUS | Status: AC | PRN
  Administered 2018-03-21: 100 mL via INTRAVENOUS

## 2018-03-21 MED ORDER — NITROGLYCERIN 0.4 MG SL SUBL
0.8000 mg | SUBLINGUAL_TABLET | Freq: Once | SUBLINGUAL | Status: AC
  Administered 2018-03-21: 0.8 mg via SUBLINGUAL
  Filled 2018-03-21: qty 25

## 2018-03-21 MED ORDER — NITROGLYCERIN 0.4 MG SL SUBL
SUBLINGUAL_TABLET | SUBLINGUAL | Status: AC
  Filled 2018-03-21: qty 2

## 2018-03-21 NOTE — Progress Notes (Signed)
Patient tolerated CT without incident. Ate cookies and drank water. Ambulated steady gait to exit.

## 2018-03-21 NOTE — Progress Notes (Signed)
Patient denies chest pain or shortness of breath currently. States has a loop recorder.

## 2018-03-25 ENCOUNTER — Telehealth: Payer: Self-pay

## 2018-03-25 NOTE — Telephone Encounter (Signed)
-----   Message from Hillis RangeJames Allred, MD sent at 03/18/2018 10:22 AM EST ----- Thanks  ----- Message ----- From: Coralyn HellingSood, Vineet, MD Sent: 03/17/2018   3:03 PM EST To: Hillis RangeJames Allred, MD  PFT now corrected.  Study was normal.  Nothing on PFT to explain dyspnea.  Vineet   ----- Message ----- From: Hillis RangeAllred, James, MD Sent: 03/09/2018   9:58 PM EST To: Coralyn HellingVineet Sood, MD, Wiliam KeJennifer A Smith, RN  Results reviewed but I do not see a conclusion. Vineet, anything here to explain her SOB?

## 2018-03-25 NOTE — Telephone Encounter (Signed)
Detailed message left per DPR.  Advised of normal PFT's  Will await final results of cardiac CT

## 2018-03-29 ENCOUNTER — Telehealth: Payer: Self-pay

## 2018-03-29 NOTE — Telephone Encounter (Signed)
Pt given results of cardiac CT.  Per Pt she continues to feel "like I can't catch my breath".  Results of cardiac CT indicate pulmonary htn.  Advised Pt will discuss with Dr. Johney FrameAllred at appt next week.  Pt indicates understanding.

## 2018-03-29 NOTE — Telephone Encounter (Signed)
-----   Message from Hillis RangeJames Allred, MD sent at 03/27/2018  8:45 PM EST ----- Results reviewed.  Boneta LucksJenny, please inform pt of result.  No CAD or PV stenosis. I will route to primary care also.

## 2018-03-30 ENCOUNTER — Ambulatory Visit (INDEPENDENT_AMBULATORY_CARE_PROVIDER_SITE_OTHER): Payer: Medicare PPO

## 2018-03-30 DIAGNOSIS — I480 Paroxysmal atrial fibrillation: Secondary | ICD-10-CM | POA: Diagnosis not present

## 2018-03-30 NOTE — Progress Notes (Signed)
Carelink Summary Report / Loop Recorder 

## 2018-04-06 ENCOUNTER — Ambulatory Visit: Payer: Medicare PPO | Admitting: Internal Medicine

## 2018-04-06 ENCOUNTER — Encounter: Payer: Self-pay | Admitting: Internal Medicine

## 2018-04-06 VITALS — BP 120/80 | HR 67 | Ht 64.0 in | Wt 203.0 lb

## 2018-04-06 DIAGNOSIS — R0602 Shortness of breath: Secondary | ICD-10-CM

## 2018-04-06 DIAGNOSIS — R002 Palpitations: Secondary | ICD-10-CM

## 2018-04-06 DIAGNOSIS — I480 Paroxysmal atrial fibrillation: Secondary | ICD-10-CM

## 2018-04-06 DIAGNOSIS — I712 Thoracic aortic aneurysm, without rupture: Secondary | ICD-10-CM | POA: Diagnosis not present

## 2018-04-06 DIAGNOSIS — I7121 Aneurysm of the ascending aorta, without rupture: Secondary | ICD-10-CM

## 2018-04-06 MED ORDER — METOPROLOL SUCCINATE ER 25 MG PO TB24: 12.5000 mg | ORAL_TABLET | Freq: Every day | ORAL | 3 refills | Status: DC

## 2018-04-06 NOTE — Progress Notes (Signed)
   PCP: Ardith DarkParker, Caleb M, MD   Primary EP: Dr Higinio PlanAllred  Brenda M. Brenda [name] is a 77 y.o. female who presents today for routine electrophysiology followup.  Since last being seen in our clinic, the patient reports doing very well.  Today, she denies symptoms of palpitations, chest pain, shortness of breath,  lower extremity edema, dizziness, presyncope, or syncope.  The patient is otherwise without complaint today.   Past Medical History:  Diagnosis Date  . Diverticulosis   . GERD (gastroesophageal reflux disease)   . Hypertension   . OSA (obstructive sleep apnea)    severe  . Paroxysmal A-fib [facility])    [facility]   Past Surgical History:  Procedure Laterality Date  . ATRIAL FIBRILLATION ABLATION     [employer]  . CHOLECYSTECTOMY  1977  . COLON RESECTION  2005   due to diverticulitis  . LOOP RECORDER INSERTION N/A 12/08/2016   Procedure: LOOP RECORDER INSERTION;  Surgeon: Hillis RangeAllred, Veva Grimley, MD;  [facility];  Service: Cardiovascular;  Laterality: N/A;  . TOTAL ABDOMINAL HYSTERECTOMY  1985    ROS- all systems are reviewed and negatives except as per HPI above  Current Outpatient Medications  Medication Sig Dispense Refill  . aspirin 81 MG tablet Take 81 mg by mouth daily.    Marland Kitchen. atorvastatin (LIPITOR) 40 MG tablet TAKE 1 TABLET BY MOUTH DAILY 90 tablet 0  . furosemide (LASIX) 20 MG tablet Take 20 mg by mouth daily as needed for fluid.     Marland Kitchen. loratadine (CLARITIN) 10 MG tablet Take 10 mg by mouth daily.     . metoprolol succinate (TOPROL-XL) 25 MG 24 hr tablet TAKE 1 TABLET BY MOUTH DAILY 90 tablet 3  . ranitidine (ZANTAC) 150 MG tablet TAKE 1 TABLET BY MOUTH TWICE DAILY 180 tablet 0   No current facility-administered medications for this visit.     Physical Exam: Vitals:   04/06/18 1126  BP: 120/80  Pulse: 67  SpO2: 99%  Weight: 203 lb (92.1 kg)  Height: 5\' 4"  (1.626 m)    GEN- The patient is well appearing, alert and oriented x 3 today.   Head-  normocephalic, atraumatic Eyes-  Sclera clear, conjunctiva pink Ears- hearing intact Oropharynx- clear Lungs- Clear to ausculation bilaterally, normal work of breathing Heart- Regular rate and rhythm, no murmurs, rubs or gallops, PMI not laterally displaced GI- soft, NT, ND, + BS Extremities- no clubbing, cyanosis, or edema  Wt Readings from Last 3 Encounters:  04/06/18 203 lb (92.1 kg)  02/23/18 204 lb 3.2 oz (92.6 kg)  08/13/17 205 lb 6.4 oz (93.2 kg)    EKG tracing ordered today is personally reviewed and shows sinus rhythm  Assessment and Plan:  1. afib No afib by ILR since her last visit chads2vasc score is 4.  She may be willing to consider OAC if her afib burden increases but declines for now.  2. HTN Stable No change required today  3. SOB PFTs normal, echo without findings to explain SOB, cardiac CT reveals no CAD or PV stenosis.  Some pulmonary HTN noted on CT.  4. Ascending aortic aneurysm (42mm) Repeat chest CT with contrast in a year  5. OSA She snores and is tired during the day Follow-up with Dr Craige CottaSood (She uses CPAP) Reduce toprol to 12.5mg  daily  Follow-up with AF clinic every 6 months I will see when needed [facility]  Hillis RangeJames Aarianna Hoadley MD, [facility] 04/06/2018 11:33 AM

## 2018-04-06 NOTE — Patient Instructions (Signed)
Medication Instructions:  Your physician has recommended you make the following change in your medication:   1. Decrease you Torpol XL to 12.5mg , half tablet, once daily.  Labwork: None ordered.  Testing/Procedures: Non-Cardiac CT scanning, (CAT scanning), is a noninvasive, special x-ray that produces cross-sectional images of the body using x-rays and a computer. CT scans help physicians diagnose and treat medical conditions. For some CT exams, a contrast material is used to enhance visibility in the area of the body being studied. CT scans provide greater clarity and reveal more details than regular x-ray exams.  Follow-Up: Your physician recommends that you schedule a follow-up appointment in:   6 months with Rudi Cocoonna Carroll at the A Fib clinic  One Year for your Chest CT  Any Other Special Instructions Will Be Listed Below (If Applicable).     If you need a refill on your cardiac medications before your next appointment, please call your pharmacy.

## 2018-04-08 DIAGNOSIS — G4733 Obstructive sleep apnea (adult) (pediatric): Secondary | ICD-10-CM | POA: Diagnosis not present

## 2018-04-17 ENCOUNTER — Other Ambulatory Visit: Payer: Self-pay | Admitting: Family Medicine

## 2018-04-28 ENCOUNTER — Other Ambulatory Visit: Payer: Self-pay | Admitting: Family Medicine

## 2018-05-02 ENCOUNTER — Ambulatory Visit (INDEPENDENT_AMBULATORY_CARE_PROVIDER_SITE_OTHER): Payer: Medicare PPO

## 2018-05-02 DIAGNOSIS — I480 Paroxysmal atrial fibrillation: Secondary | ICD-10-CM | POA: Diagnosis not present

## 2018-05-03 LAB — CUP PACEART REMOTE DEVICE CHECK
Date Time Interrogation Session: 20191230204003
Implantable Pulse Generator Implant Date: 20180807

## 2018-05-03 NOTE — Progress Notes (Signed)
Carelink Summary Report / Loop Recorder 

## 2018-05-04 HISTORY — PX: CATARACT EXTRACTION, BILATERAL: SHX1313

## 2018-05-15 LAB — CUP PACEART REMOTE DEVICE CHECK
Date Time Interrogation Session: 20191127193945
MDC IDC PG IMPLANT DT: 20180807

## 2018-05-25 LAB — CUP PACEART INCLINIC DEVICE CHECK
Implantable Pulse Generator Implant Date: 20180807
MDC IDC SESS DTM: 20191204172853

## 2018-06-06 ENCOUNTER — Ambulatory Visit (INDEPENDENT_AMBULATORY_CARE_PROVIDER_SITE_OTHER): Payer: Medicare PPO

## 2018-06-06 DIAGNOSIS — I480 Paroxysmal atrial fibrillation: Secondary | ICD-10-CM

## 2018-06-08 LAB — CUP PACEART REMOTE DEVICE CHECK
Date Time Interrogation Session: 20200201203600
Implantable Pulse Generator Implant Date: 20180807

## 2018-06-14 NOTE — Progress Notes (Signed)
Carelink Summary Report / Loop Recorder 

## 2018-07-07 ENCOUNTER — Ambulatory Visit (INDEPENDENT_AMBULATORY_CARE_PROVIDER_SITE_OTHER): Payer: Medicare PPO | Admitting: *Deleted

## 2018-07-07 DIAGNOSIS — I480 Paroxysmal atrial fibrillation: Secondary | ICD-10-CM | POA: Diagnosis not present

## 2018-07-07 LAB — CUP PACEART REMOTE DEVICE CHECK
Date Time Interrogation Session: 20200305203949
Implantable Pulse Generator Implant Date: 20180807

## 2018-07-11 DIAGNOSIS — H25043 Posterior subcapsular polar age-related cataract, bilateral: Secondary | ICD-10-CM | POA: Diagnosis not present

## 2018-07-11 DIAGNOSIS — H2513 Age-related nuclear cataract, bilateral: Secondary | ICD-10-CM | POA: Diagnosis not present

## 2018-07-15 NOTE — Progress Notes (Signed)
Carelink Summary Report / Loop Recorder 

## 2018-07-23 ENCOUNTER — Other Ambulatory Visit: Payer: Self-pay | Admitting: Family Medicine

## 2018-08-09 ENCOUNTER — Ambulatory Visit (INDEPENDENT_AMBULATORY_CARE_PROVIDER_SITE_OTHER): Payer: Medicare PPO | Admitting: *Deleted

## 2018-08-09 ENCOUNTER — Other Ambulatory Visit: Payer: Self-pay

## 2018-08-09 DIAGNOSIS — I480 Paroxysmal atrial fibrillation: Secondary | ICD-10-CM

## 2018-08-10 LAB — CUP PACEART REMOTE DEVICE CHECK
Date Time Interrogation Session: 20200407213909
Implantable Pulse Generator Implant Date: 20180807

## 2018-08-17 NOTE — Progress Notes (Signed)
Carelink Summary Report / Loop Recorder 

## 2018-08-23 ENCOUNTER — Other Ambulatory Visit: Payer: Self-pay | Admitting: Orthopaedic Surgery

## 2018-08-23 DIAGNOSIS — M79645 Pain in left finger(s): Secondary | ICD-10-CM | POA: Diagnosis not present

## 2018-08-23 DIAGNOSIS — M25531 Pain in right wrist: Secondary | ICD-10-CM

## 2018-08-24 ENCOUNTER — Telehealth: Payer: Self-pay | Admitting: Physician Assistant

## 2018-08-24 ENCOUNTER — Encounter (HOSPITAL_BASED_OUTPATIENT_CLINIC_OR_DEPARTMENT_OTHER): Payer: Self-pay | Admitting: *Deleted

## 2018-08-24 ENCOUNTER — Telehealth: Payer: Self-pay

## 2018-08-24 ENCOUNTER — Encounter (HOSPITAL_BASED_OUTPATIENT_CLINIC_OR_DEPARTMENT_OTHER)
Admission: RE | Admit: 2018-08-24 | Discharge: 2018-08-24 | Disposition: A | Discharge status: Home / Self Care | Payer: Medicare PPO | Source: Ambulatory Visit | Attending: Orthopaedic Surgery | Admitting: Orthopaedic Surgery

## 2018-08-24 ENCOUNTER — Other Ambulatory Visit: Payer: Self-pay

## 2018-08-24 DIAGNOSIS — Z01812 Encounter for preprocedural laboratory examination: Secondary | ICD-10-CM | POA: Insufficient documentation

## 2018-08-24 LAB — BASIC METABOLIC PANEL
Anion gap: 10 (ref 5–15)
BUN: 9 mg/dL (ref 8–23)
CO2: 24 mmol/L (ref 22–32)
Calcium: 9.1 mg/dL (ref 8.9–10.3)
Chloride: 103 mmol/L (ref 98–111)
Creatinine, Ser: 0.92 mg/dL (ref 0.44–1.00)
GFR calc Af Amer: >60 mL/min (ref >60)
GFR calc non Af Amer: >60 mL/min (ref >60)
Glucose, Bld: 89 mg/dL (ref 70–99)
Potassium: 4 mmol/L (ref 3.5–5.1)
Sodium: 137 mmol/L (ref 135–145)

## 2018-08-24 NOTE — Telephone Encounter (Signed)
   Primary Cardiologist: No primary care provider on file.  Chart reviewed as part of pre-operative protocol coverage. Patient was contacted 08/24/2018 in reference to pre-operative risk assessment for pending surgery as outlined below.  Brenda [name] was last seen on 04/06/18 by Dr. Johney Frame.  Since that day, Brenda [name] has done well. She exercises 5 days per week. After a walk yesterday, she fell and broke her wrist. She denies new cardiac symptoms, no palpitations or syncope (fall was due to tripping on a curb).  Therefore, based on ACC/AHA guidelines, the patient would be at acceptable risk for the planned procedure without further cardiovascular testing.   I will route this recommendation to the requesting party via Epic fax function and remove from pre-op pool.  Please call with questions.  Roe Rutherford Myiesha Edgar, PA 08/24/2018, 10:27 AM

## 2018-08-24 NOTE — Telephone Encounter (Signed)
Ok to hold aspirin as needed

## 2018-08-24 NOTE — Telephone Encounter (Signed)
Following surgical clearance provided today, 08/24/2018, surgeon's office is now asking to hold aspirin.  Patient had a recent CT coronary that revealed 0 calcium and no obstructive disease.  I will message Dr. Johney Frame for recommendations on holding aspirin.

## 2018-08-24 NOTE — Telephone Encounter (Signed)
   [facility] Pre-operative Risk Assessment    Request for surgical clearance:  1. What type of surgery is being performed? ORIF RIGHT DISTAL RADIUS   2. When is this surgery scheduled? 08/29/2018   3. Are there any medications that need to be held prior to surgery and how long? NONE MENTIONED   4. Practice name and name of physician performing surgery? [facility] DR. Ophelia Charter  5. What is your office phone and fax number? [phone] f# [phone]   0. Anesthesia type (None, local, MAC, general) ? CHOICE   Ferron Ishmael 08/24/2018, 10:12 AM  _________________________________________________________________   (provider comments below)

## 2018-08-26 ENCOUNTER — Ambulatory Visit
Admission: RE | Admit: 2018-08-26 | Discharge: 2018-08-26 | Disposition: A | Discharge status: Home / Self Care | Payer: Medicare PPO | Source: Ambulatory Visit | Attending: Orthopaedic Surgery | Admitting: Orthopaedic Surgery

## 2018-08-26 ENCOUNTER — Other Ambulatory Visit: Payer: Self-pay

## 2018-08-26 DIAGNOSIS — M25531 Pain in right wrist: Secondary | ICD-10-CM | POA: Diagnosis not present

## 2018-08-26 NOTE — Progress Notes (Signed)
Pt has decided with Dr Everardo Pacific to cancel surgery posted for 08/29/18.

## 2018-08-29 ENCOUNTER — Ambulatory Visit (HOSPITAL_BASED_OUTPATIENT_CLINIC_OR_DEPARTMENT_OTHER): Admission: RE | Admit: 2018-08-29 | Payer: Medicare PPO | Source: Home / Self Care | Admitting: Orthopaedic Surgery

## 2018-08-29 SURGERY — OPEN REDUCTION INTERNAL FIXATION (ORIF) DISTAL RADIUS FRACTURE
Anesthesia: Choice | Laterality: Right

## 2018-09-02 DIAGNOSIS — M25531 Pain in right wrist: Secondary | ICD-10-CM | POA: Diagnosis not present

## 2018-09-09 DIAGNOSIS — M25531 Pain in right wrist: Secondary | ICD-10-CM | POA: Diagnosis not present

## 2018-09-12 ENCOUNTER — Ambulatory Visit (INDEPENDENT_AMBULATORY_CARE_PROVIDER_SITE_OTHER): Payer: Medicare PPO | Admitting: *Deleted

## 2018-09-12 ENCOUNTER — Other Ambulatory Visit: Payer: Self-pay

## 2018-09-12 DIAGNOSIS — I480 Paroxysmal atrial fibrillation: Secondary | ICD-10-CM

## 2018-09-12 DIAGNOSIS — R002 Palpitations: Secondary | ICD-10-CM

## 2018-09-12 LAB — CUP PACEART REMOTE DEVICE CHECK
Date Time Interrogation Session: 20200510213951
Implantable Pulse Generator Implant Date: 20180807

## 2018-09-15 ENCOUNTER — Encounter: Payer: Self-pay | Admitting: Internal Medicine

## 2018-09-15 NOTE — Progress Notes (Addendum)
       AF episode noted on LINQ on 09/14/18, duration 2.5hrs. 0.0% lifetime AF burden, current burden 0.1% (03/28/18-09/15/18). Per last OV note from Dr. Johney Frame, patient not currently on [facility] but may be willing to consider if AF burden increases. Upcoming appointment in AF Clinic on 10/06/18.            Continue to follow Hillis Range MD, [facility] 09/15/2018 5:32 PM

## 2018-09-21 NOTE — Progress Notes (Signed)
Carelink Summary Report / Loop Recorder 

## 2018-10-04 DIAGNOSIS — M25531 Pain in right wrist: Secondary | ICD-10-CM | POA: Diagnosis not present

## 2018-10-06 ENCOUNTER — Inpatient Hospital Stay (HOSPITAL_COMMUNITY): Admission: RE | Admit: 2018-10-06 | Payer: Medicare PPO | Source: Ambulatory Visit | Admitting: Nurse Practitioner

## 2018-10-14 ENCOUNTER — Ambulatory Visit (INDEPENDENT_AMBULATORY_CARE_PROVIDER_SITE_OTHER): Payer: Medicare PPO | Admitting: *Deleted

## 2018-10-14 DIAGNOSIS — I480 Paroxysmal atrial fibrillation: Secondary | ICD-10-CM | POA: Diagnosis not present

## 2018-10-17 ENCOUNTER — Other Ambulatory Visit: Payer: Self-pay | Admitting: Family Medicine

## 2018-10-17 LAB — CUP PACEART REMOTE DEVICE CHECK
Date Time Interrogation Session: 20200612221117
Implantable Pulse Generator Implant Date: 20180807

## 2018-10-17 NOTE — Progress Notes (Signed)
Carelink Summary Report / Loop Recorder 

## 2018-10-18 DIAGNOSIS — H47323 Drusen of optic disc, bilateral: Secondary | ICD-10-CM | POA: Diagnosis not present

## 2018-10-18 DIAGNOSIS — H2512 Age-related nuclear cataract, left eye: Secondary | ICD-10-CM | POA: Diagnosis not present

## 2018-10-18 DIAGNOSIS — H18413 Arcus senilis, bilateral: Secondary | ICD-10-CM | POA: Diagnosis not present

## 2018-10-18 DIAGNOSIS — H2513 Age-related nuclear cataract, bilateral: Secondary | ICD-10-CM | POA: Diagnosis not present

## 2018-10-18 DIAGNOSIS — H25043 Posterior subcapsular polar age-related cataract, bilateral: Secondary | ICD-10-CM | POA: Diagnosis not present

## 2018-10-18 DIAGNOSIS — H25013 Cortical age-related cataract, bilateral: Secondary | ICD-10-CM | POA: Diagnosis not present

## 2018-10-18 NOTE — Telephone Encounter (Signed)
Patient has not been seen in over a year. Do you want to give 30 supply and call patient for CPE?

## 2018-10-19 NOTE — Telephone Encounter (Signed)
Appt scheduled

## 2018-10-20 ENCOUNTER — Ambulatory Visit: Payer: Medicare PPO | Admitting: Family Medicine

## 2018-10-24 ENCOUNTER — Encounter: Payer: Self-pay | Admitting: Family Medicine

## 2018-10-24 ENCOUNTER — Other Ambulatory Visit: Payer: Self-pay

## 2018-10-24 ENCOUNTER — Ambulatory Visit (INDEPENDENT_AMBULATORY_CARE_PROVIDER_SITE_OTHER): Payer: Medicare PPO | Admitting: Family Medicine

## 2018-10-24 VITALS — BP 112/68 | HR 64 | Temp 97.8°F | Ht 64.0 in | Wt 202.2 lb

## 2018-10-24 DIAGNOSIS — M1990 Unspecified osteoarthritis, unspecified site: Secondary | ICD-10-CM | POA: Diagnosis not present

## 2018-10-24 DIAGNOSIS — K219 Gastro-esophageal reflux disease without esophagitis: Secondary | ICD-10-CM

## 2018-10-24 DIAGNOSIS — I480 Paroxysmal atrial fibrillation: Secondary | ICD-10-CM

## 2018-10-24 DIAGNOSIS — J302 Other seasonal allergic rhinitis: Secondary | ICD-10-CM | POA: Diagnosis not present

## 2018-10-24 DIAGNOSIS — G4733 Obstructive sleep apnea (adult) (pediatric): Secondary | ICD-10-CM

## 2018-10-24 DIAGNOSIS — E785 Hyperlipidemia, unspecified: Secondary | ICD-10-CM | POA: Diagnosis not present

## 2018-10-24 DIAGNOSIS — R739 Hyperglycemia, unspecified: Secondary | ICD-10-CM | POA: Diagnosis not present

## 2018-10-24 DIAGNOSIS — I10 Essential (primary) hypertension: Secondary | ICD-10-CM | POA: Diagnosis not present

## 2018-10-24 LAB — LIPID PANEL
Cholesterol: 122 mg/dL (ref 0–200)
HDL: 56.9 mg/dL (ref >39.00)
LDL Cholesterol: 48 mg/dL (ref 0–99)
NonHDL: 65.4
Total CHOL/HDL Ratio: 2
Triglycerides: 89 mg/dL (ref 0.0–149.0)
VLDL: 17.8 mg/dL (ref 0.0–40.0)

## 2018-10-24 LAB — COMPREHENSIVE METABOLIC PANEL
ALT: 19 U/L (ref 0–35)
AST: 18 U/L (ref 0–37)
Albumin: 4.2 g/dL (ref 3.5–5.2)
Alkaline Phosphatase: 87 U/L (ref 39–117)
BUN: 11 mg/dL (ref 6–23)
CO2: 29 mEq/L (ref 19–32)
Calcium: 9.3 mg/dL (ref 8.4–10.5)
Chloride: 102 mEq/L (ref 96–112)
Creatinine, Ser: 0.83 mg/dL (ref 0.40–1.20)
GFR: 66.76 mL/min (ref >60.00)
Glucose, Bld: 85 mg/dL (ref 70–99)
Potassium: 3.5 mEq/L (ref 3.5–5.1)
Sodium: 140 mEq/L (ref 135–145)
Total Bilirubin: 0.8 mg/dL (ref 0.2–1.2)
Total Protein: 6.3 g/dL (ref 6.0–8.3)

## 2018-10-24 LAB — CBC
HCT: 41.4 % (ref 36.0–46.0)
Hemoglobin: 13.7 g/dL (ref 12.0–15.0)
MCHC: 33 g/dL (ref 30.0–36.0)
MCV: 92.9 fl (ref 78.0–100.0)
Platelets: 203 10*3/uL (ref 150.0–400.0)
RBC: 4.46 Mil/uL (ref 3.87–5.11)
RDW: 13.5 % (ref 11.5–15.5)
WBC: 6 10*3/uL (ref 4.0–10.5)

## 2018-10-24 LAB — HEMOGLOBIN A1C: Hgb A1c MFr Bld: 5.7 % (ref 4.6–6.5)

## 2018-10-24 LAB — TSH: TSH: 3.44 u[IU]/mL (ref 0.35–4.50)

## 2018-10-24 MED ORDER — ATORVASTATIN CALCIUM 40 MG PO TABS: 40.0000 mg | ORAL_TABLET | Freq: Every day | ORAL | 3 refills | Status: DC

## 2018-10-24 MED ORDER — OMEPRAZOLE 20 MG PO CPDR: 20.0000 mg | DELAYED_RELEASE_CAPSULE | Freq: Every day | ORAL | 3 refills | Status: DC

## 2018-10-24 NOTE — Assessment & Plan Note (Signed)
At goal.  Continue metoprolol succinate 12.5 mg daily and Lasix 20 mg daily as needed.  Check CBC, C met, and TSH.

## 2018-10-24 NOTE — Progress Notes (Signed)
   Chief Complaint:  Brenda [name] is a 78 y.o. female who presents today with a chief complaint of HTN follow up.   Assessment/Plan:  Seasonal allergies Stable. Continue claritin seasonally.   Dyslipidemia Continue lipitor 4m daily. Check lipid panel.   GERD (gastroesophageal reflux disease) Stable.  Continue omeprazole 20 mg daily.  Hypertension At goal.  Continue metoprolol succinate 12.5 mg daily and Lasix 20 mg daily as needed.  Check CBC, C met, and TSH.  OSA (obstructive sleep apnea) Continue management per pulmonology.  [place]ox[place]fib [place] Continue management per cardiology.  Osteoarthritis Stable.  Continue OTC analgesics as needed.  BMI 34 Discussed lifestyle modifications.  Preventative Healthcare Due for pneumonia vaccine - advised to check with insurance regarding coverage.  Follow up with me in 6-12 months.     Subjective:  HPI:  Her stable, chronic medical conditions are outlined below:   # Essential Hypertension / Paroxysmal atrial fibrillation - Follows with cardiology for this - On lasix 24mdaily, and metoprolol succinate 12.28m67maily. Tolerating all well without side effects - ROS: No reported chest pain or shortness of breath  # GERD - On prilosec 28m28mily and tolerating well  # Dyslipidemia - On lipitor 40mg26mly and tolerating well - ROS: No reported myalgias  # Seasonal Allergies - On claritin 10mg 21my seasonally as needed  ROS: Per HPI  PMH: She reports that she quit smoking about 12 years ago. Her smoking use included cigarettes. She has never used smokeless tobacco. She reports that she does not drink alcohol or use drugs.      Objective:  Physical Exam: BP 112/68 (BP Location: Left Arm, Patient Position: Sitting, Cuff Size: Normal)   Pulse 64   Temp 97.8 F (36.6 C) (Oral)   Ht _0  (1.626 m)   Wt 202 lb 3.2 oz (91.7 kg)   SpO2 95%   BMI 34.71 kg/m   Wt Readings from Last 3 Encounters:  10/24/18 202 lb 3.2  oz (91.7 kg)  04/06/18 203 lb (92.1 kg)  02/23/18 204 lb 3.2 oz (92.6 kg)  Gen: NAD, resting comfortably CV: Regular rate and rhythm with no murmurs appreciated Pulm: Normal work of breathing, clear to auscultation bilaterally with no crackles, wheezes, or rhonchi MSK: No edema, cyanosis, or clubbing noted Skin: Warm, dry Neuro: Grossly normal, moves all extremities Psych: Normal affect and thought content      Brenda Geron M. ParkerJerline Pain/22/2020 9:29 AM

## 2018-10-24 NOTE — Assessment & Plan Note (Addendum)
Stable. Continue claritin seasonally.

## 2018-10-24 NOTE — Assessment & Plan Note (Signed)
Stable.  Continue omeprazole 20mg daily

## 2018-10-24 NOTE — Assessment & Plan Note (Signed)
Continue management per pulmonology. 

## 2018-10-24 NOTE — Assessment & Plan Note (Signed)
Continue lipitor 40mg  daily. Check lipid panel.

## 2018-10-24 NOTE — Assessment & Plan Note (Signed)
Stable.  Continue OTC analgesics as needed.

## 2018-10-24 NOTE — Patient Instructions (Signed)
It was very nice to see you today!  I am glad that you are doing well.  Keep up the good work!  I will send in Protonix for you.  Also send in a refill on your Lipitor.  No medication changes today.  We will check blood work today.  Please check with your insurance about your pneumonia vaccine.  We can do this here if you wish to have this done.  Come back to see me in 6 to 12 months, or sooner as needed.  Take care, Dr Jerline Pain

## 2018-10-24 NOTE — Assessment & Plan Note (Signed)
Continue management per cardiology. 

## 2018-10-25 NOTE — Progress Notes (Signed)
Please inform patient of the following:  Blood work is all NORMAL. Cholesterol has improved since the last time we checked it. Recommend continuing current treatment plan and we can recheck in a year or so.

## 2018-11-07 ENCOUNTER — Other Ambulatory Visit: Payer: Self-pay | Admitting: Family Medicine

## 2018-11-07 DIAGNOSIS — Z1231 Encounter for screening mammogram for malignant neoplasm of breast: Secondary | ICD-10-CM

## 2018-11-08 DIAGNOSIS — M25531 Pain in right wrist: Secondary | ICD-10-CM | POA: Diagnosis not present

## 2018-11-14 DIAGNOSIS — H2512 Age-related nuclear cataract, left eye: Secondary | ICD-10-CM | POA: Diagnosis not present

## 2018-11-15 DIAGNOSIS — H2511 Age-related nuclear cataract, right eye: Secondary | ICD-10-CM | POA: Diagnosis not present

## 2018-11-16 ENCOUNTER — Ambulatory Visit (INDEPENDENT_AMBULATORY_CARE_PROVIDER_SITE_OTHER): Payer: Medicare PPO | Admitting: *Deleted

## 2018-11-16 DIAGNOSIS — I480 Paroxysmal atrial fibrillation: Secondary | ICD-10-CM

## 2018-11-17 ENCOUNTER — Other Ambulatory Visit: Payer: Self-pay

## 2018-11-17 ENCOUNTER — Encounter (HOSPITAL_COMMUNITY): Payer: Self-pay | Admitting: Nurse Practitioner

## 2018-11-17 ENCOUNTER — Ambulatory Visit (HOSPITAL_COMMUNITY)
Admission: RE | Admit: 2018-11-17 | Discharge: 2018-11-17 | Disposition: A | Discharge status: Home / Self Care | Payer: Medicare PPO | Source: Ambulatory Visit | Attending: Nurse Practitioner | Admitting: Nurse Practitioner

## 2018-11-17 VITALS — BP 114/74 | HR 79 | Ht 64.0 in | Wt 201.8 lb

## 2018-11-17 DIAGNOSIS — Z79899 Other long term (current) drug therapy: Secondary | ICD-10-CM | POA: Insufficient documentation

## 2018-11-17 DIAGNOSIS — Z87891 Personal history of nicotine dependence: Secondary | ICD-10-CM | POA: Insufficient documentation

## 2018-11-17 DIAGNOSIS — K219 Gastro-esophageal reflux disease without esophagitis: Secondary | ICD-10-CM | POA: Diagnosis not present

## 2018-11-17 DIAGNOSIS — Z7901 Long term (current) use of anticoagulants: Secondary | ICD-10-CM | POA: Insufficient documentation

## 2018-11-17 DIAGNOSIS — I480 Paroxysmal atrial fibrillation: Secondary | ICD-10-CM | POA: Insufficient documentation

## 2018-11-17 DIAGNOSIS — Z7982 Long term (current) use of aspirin: Secondary | ICD-10-CM | POA: Diagnosis not present

## 2018-11-17 LAB — CUP PACEART REMOTE DEVICE CHECK
Date Time Interrogation Session: 20200715224104
Implantable Pulse Generator Implant Date: 20180807

## 2018-11-17 NOTE — Progress Notes (Signed)
Primary Care Physician: Vivi Barrack, MD Referring Physician: Dr. Ruffin Frederick. Brenda [name] is a 78 y.o. female with a h/o paroxysmal afib with a Linq for surveillance that is here for f/u.  She reports that she is doing well.She did have 2.5 hours of afib in May 2020 that she was unaware of. Her afib burden is low at 0.1 %. She has a CHA2DS2VASc score of 4 and is not on anticoagulation. She has deferred in the past unless her burden significantly increases.   Today, she denies symptoms of palpitations, chest pain, shortness of breath, orthopnea, PND, lower extremity edema, dizziness, presyncope, syncope, or neurologic sequela. The patient is tolerating medications without difficulties and is otherwise without complaint today.   Past Medical History:  Diagnosis Date   Diverticulosis    GERD (gastroesophageal reflux disease)    Hypertension    OSA (obstructive sleep apnea)    severe   Paroxysmal A-fib ([employer])    s/p afib ablation at [facility]   Past Surgical History:  Procedure Laterality Date   [place]  2005   due to diverticulitis   LOOP RECORDER INSERTION N/A 12/08/2016   Procedure: [place];  Surgeon: Thompson Grayer, MD;  Location: [facility] CV LAB;  Service: Cardiovascular;  Laterality: N/A;   TOTAL ABDOMINAL HYSTERECTOMY  1985    Current Outpatient Medications  Medication Sig Dispense Refill   aspirin 81 MG tablet Take 81 mg by mouth daily.     atorvastatin (LIPITOR) 40 MG tablet Take 1 tablet (40 mg total) by mouth daily. 90 tablet 3   furosemide (LASIX) 20 MG tablet Take 20 mg by mouth daily as needed for fluid.      loratadine (CLARITIN) 10 MG tablet Take 10 mg by mouth daily.      metoprolol succinate (TOPROL-XL) 25 MG 24 hr tablet Take 0.5 tablets (12.5 mg total) by mouth daily. 90 tablet 3   omeprazole (PRILOSEC) 20 MG capsule Take 1 capsule (20 mg total) by  mouth daily. 90 capsule 3   No current facility-administered medications for this encounter.     No Known Allergies  Social History   Socioeconomic History   Marital status: Divorced    Spouse name: Not on file   Number of children: Not on file   Years of education: Not on file   Highest education level: Not on file  Occupational History   Not on file  Social Needs   Financial resource strain: Not on file   Food insecurity    Worry: Not on file    Inability: Not on file   Transportation needs    Medical: Not on file    Non-medical: Not on file  Tobacco Use   Smoking status: Former Smoker    Types: Cigarettes    Quit date: 07/03/2006    Years since quitting: 12.3   Smokeless tobacco: Never Used  Substance and Sexual Activity   Alcohol use: No   Drug use: No   Sexual activity: Never  Lifestyle   Physical activity    Days per week: Not on file    Minutes per session: Not on file   Stress: Not on file  Relationships   Social connections    Talks on phone: Not on file    Gets together: Not on file    Attends religious service: Not on file  Active member of club or organization: Not on file    Attends meetings of clubs or organizations: Not on file    Relationship status: Not on file   Intimate partner violence    Fear of current or ex partner: Not on file    Emotionally abused: Not on file    Physically abused: Not on file    Forced sexual activity: Not on file  Other Topics Concern   Not on file  Social History Narrative   [place]   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    Family History  Problem Relation Age of Onset   Stroke Mother    Parkinson's disease Mother     ROS- All systems are reviewed and negative except as per the HPI above  Physical Exam: Vitals:   11/17/18 0926  BP: 114/74  Pulse: 79  Weight: 91.5 kg  Height: 5\' 4"  (1.626 m)   Wt Readings from Last 3 Encounters:  11/17/18 91.5 kg    10/24/18 91.7 kg  04/06/18 92.1 kg    Labs: Lab Results  Component Value Date   NA 140 10/24/2018   K 3.5 10/24/2018   CL 102 10/24/2018   CO2 29 10/24/2018   GLUCOSE 85 10/24/2018   BUN 11 10/24/2018   CREATININE 0.83 10/24/2018   CALCIUM 9.3 10/24/2018   No results found for: INR Lab Results  Component Value Date   CHOL 122 10/24/2018   HDL 56.90 10/24/2018   LDLCALC 48 10/24/2018   TRIG 89.0 10/24/2018     GEN- The patient is well appearing, alert and oriented x 3 today.   Head- normocephalic, atraumatic Eyes-  Sclera clear, conjunctiva pink Ears- hearing intact Oropharynx- clear Neck- supple, no JVP Lymph- no cervical lymphadenopathy Lungs- Clear to ausculation bilaterally, normal work of breathing Heart- Regular rate and rhythm, no murmurs, rubs or gallops, PMI not laterally displaced GI- soft, NT, ND, + BS Extremities- no clubbing, cyanosis, or edema MS- no significant deformity or atrophy Skin- no rash or lesion Psych- euthymic mood, full affect Neuro- strength and sensation are intact  EKG-NSR at 79 bpm  Linq-AF episode noted on LINQ on 09/14/18, duration 2.5hrs. 0.0% lifetime AF burden, current burden 0.1% (03/28/18-09/15/18). Per last OV note from Dr. Johney FrameAllred, [facility] but may be willing to consider if AF burden increases. Upcoming appointment in AF Clinic on 10/06/18.   Assessment and Plan: 1. Paroxysmal afib She been very quiet with only 2.5 hours of asymptomatic afib Continue BB without change  2. CHA2DS2VASc score of 4 afib burden is 0.1% Discussed going on anticoagulation She prefers to wait until afib burden is higher  I will make sure that device clinic has her flagged to be notified if any showing of afib so she can further decide on starting anticoagualtion  F/u here in 6 months  Lupita LeashDonna C. Matthew Folksarroll, ANP-C Afib Clinic [facility] [address] [place], [place] [address] [phone]

## 2018-11-18 ENCOUNTER — Ambulatory Visit: Payer: Medicare PPO | Admitting: Pulmonary Disease

## 2018-11-18 ENCOUNTER — Encounter: Payer: Self-pay | Admitting: Pulmonary Disease

## 2018-11-18 VITALS — BP 104/70 | HR 78 | Temp 98.0°F | Ht 64.0 in | Wt 201.2 lb

## 2018-11-18 DIAGNOSIS — G4733 Obstructive sleep apnea (adult) (pediatric): Secondary | ICD-10-CM | POA: Diagnosis not present

## 2018-11-18 DIAGNOSIS — R059 Cough, unspecified: Secondary | ICD-10-CM

## 2018-11-18 DIAGNOSIS — R0602 Shortness of breath: Secondary | ICD-10-CM

## 2018-11-18 DIAGNOSIS — Z9989 Dependence on other enabling machines and devices: Secondary | ICD-10-CM | POA: Diagnosis not present

## 2018-11-18 DIAGNOSIS — R05 Cough: Secondary | ICD-10-CM

## 2018-11-18 DIAGNOSIS — J301 Allergic rhinitis due to pollen: Secondary | ICD-10-CM | POA: Diagnosis not present

## 2018-11-18 MED ORDER — ALBUTEROL SULFATE HFA 108 (90 BASE) MCG/ACT IN AERS: 2.0000 | INHALATION_SPRAY | Freq: Four times a day (QID) | RESPIRATORY_TRACT | 2 refills | Status: DC | PRN

## 2018-11-18 NOTE — Progress Notes (Signed)
[facility]  Chief Complaint  Patient presents with  . Follow-up    Constitutional:  BP 104/70 (BP Location: Right Arm, Cuff Size: Normal)   Pulse 78   Temp 98 F (36.7 C) (Oral)   Ht 5\' 4"  (1.626 m)   Wt 201 lb 3.2 oz (91.3 kg)   SpO2 97%   BMI 34.54 kg/m   Past Medical History:  PAF, HTN, GERD, Diverticulosis  Brief Summary:  Brenda [name] is a 78 y.o. female with obstructive sleep apnea and dyspnea.  Since her last visit she had Echo, coronary CT, and PFT.  All were unrevealing for cause of her shortness of breath.  She gets chest congestion and cough in the morning.  She will also feel more short of breath in hot weather, or around dust and pollen.  She uses claritin.  She has not been tried on inhaler therapy for asthma or tried singulair.  She uses CPAP nightly.  She gets pressure on her nasal bridge from mask, but uses a band aid.  Denies mouth dryness, sore throat, or aerophagia.   Physical Exam:   Appearance - well kempt   ENMT - clear nasal mucosa, midline nasal  septum, no oral exudates, no LAN, trachea midline  Respiratory - normal chest wall, normal respiratory effort, no accessory muscle use, no wheeze/rales  CV - s1s2 regular rate and rhythm, no murmurs, no peripheral edema, radial pulses symmetric  GI - soft, non tender, no masses  Lymph - no adenopathy noted in neck and axillary areas  MSK - normal gait  Ext - no cyanosis, clubbing, or joint inflammation noted  Skin - no rashes, lesions, or ulcers  Neuro - normal strength, oriented x 3  Psych - normal mood and affect   Assessment/Plan:   Dyspnea on exertion with morning cough and chest congestion. - PFT, CT chest unrevealing - might have asthma in setting of allergic rhinitis - will have her try ventolin before bedtime; if this is helpful and she needs regular therapy, then consider adding singulair at night - inhaler technique demonstrated -  continue claritin  Obstructive sleep apnea. - she is compliant with CPAP and reports benefit - continue auto CPAP 5 to 18 cm H2O - discussed options to help with mask fit  CPAP rhinitis. - reviewed cleaning techniques for equipment - continue claritin   Patient Instructions  Ventolin (albuterol) two puffs as needed for cough, wheeze, or chest congestion.  Trying using at night for the next few nights.  If this is helpful and you feel like you need to use on a regular basis, then call and we will send in a prescription for maintenance medication for asthma.  Follow up in 2 months to assess status of breathing issues    Coralyn HellingVineet Rossie Bretado, MD [facility] Pager: [phone] 11/18/2018, 10:21 AM  Flow Sheet     Pulmonary tests:  PFT 03/03/18 >> FEV1 2.54 (107%), FEV1% 81, TLC 5.46 (104%), DLCO 84%, no BD  Sleep tests:  HST 04/14/16 >> AHI 39.1, SpO2 low 67% Auto 10/19/18 to 11/17/18 >> used on 29 of 30 nights with average 8 hrs 30 min.  Average AHI 1.9 with median CPAP 15 and 95 th percentile CPAP 18 cm H2O  Cardiac tests:  Echo 03/03/18 >> EF 60 to 65%, grade 1 DD, mild MR Coronary CT 03/22/18 >> atherosclerosis, 4.3 cm ascending aorta  Medications:   Allergies as of 11/18/2018   No Known  Allergies     Medication List       Accurate as of November 18, 2018 10:21 AM. If you have any questions, ask your nurse or doctor.        albuterol 108 (90 Base) MCG/ACT inhaler Commonly known as: Ventolin HFA Inhale 2 puffs into the lungs every 6 (six) hours as needed for wheezing or shortness of breath. Started by: Chesley Mires, MD   aspirin 81 MG tablet Take 81 mg by mouth daily.   atorvastatin 40 MG tablet Commonly known as: LIPITOR Take 1 tablet (40 mg total) by mouth daily.   furosemide 20 MG tablet Commonly known as: LASIX Take 20 mg by mouth daily as needed for fluid.   loratadine 10 MG tablet Commonly known as: CLARITIN Take 10 mg by mouth daily.    metoprolol succinate 25 MG 24 hr tablet Commonly known as: TOPROL-XL Take 0.5 tablets (12.5 mg total) by mouth daily.   omeprazole 20 MG capsule Commonly known as: PRILOSEC Take 1 capsule (20 mg total) by mouth daily.       Past Surgical History:  She  has a past surgical history that includes Colon resection (2005); Cholecystectomy (1977); Total abdominal hysterectomy (1985); ATRIAL FIBRILLATION ABLATION; and LOOP RECORDER INSERTION (N/A, 12/08/2016).  Family History:  Her family history includes Parkinson's disease in her mother; Stroke in her mother.  Social History:  She  reports that she quit smoking about 12 years ago. Her smoking use included cigarettes. She started smoking about 60 years ago. She has a 24.00 pack-year smoking history. She has never used smokeless tobacco. She reports that she does not drink alcohol or use drugs.

## 2018-11-18 NOTE — Patient Instructions (Signed)
Ventolin (albuterol) two puffs as needed for cough, wheeze, or chest congestion.  Trying using at night for the next few nights.  If this is helpful and you feel like you need to use on a regular basis, then call and we will send in a prescription for maintenance medication for asthma.  Follow up in 2 months to assess status of breathing issues

## 2018-11-18 NOTE — Progress Notes (Signed)
Patient seen in the office today and instructed on use of albuterol inhaler.  Patient expressed understanding and demonstrated technique.

## 2018-11-25 NOTE — Progress Notes (Signed)
Carelink Summary Report / Loop Recorder 

## 2018-12-05 DIAGNOSIS — H2511 Age-related nuclear cataract, right eye: Secondary | ICD-10-CM | POA: Diagnosis not present

## 2018-12-19 ENCOUNTER — Ambulatory Visit (INDEPENDENT_AMBULATORY_CARE_PROVIDER_SITE_OTHER): Payer: Medicare PPO | Admitting: *Deleted

## 2018-12-19 DIAGNOSIS — I480 Paroxysmal atrial fibrillation: Secondary | ICD-10-CM

## 2018-12-20 LAB — CUP PACEART REMOTE DEVICE CHECK
Date Time Interrogation Session: 20200817224138
Implantable Pulse Generator Implant Date: 20180807

## 2018-12-21 ENCOUNTER — Other Ambulatory Visit: Payer: Self-pay

## 2018-12-21 ENCOUNTER — Ambulatory Visit
Admission: RE | Admit: 2018-12-21 | Discharge: 2018-12-21 | Disposition: A | Discharge status: Home / Self Care | Payer: Medicare PPO | Source: Ambulatory Visit | Attending: Family Medicine | Admitting: Family Medicine

## 2018-12-21 DIAGNOSIS — Z1231 Encounter for screening mammogram for malignant neoplasm of breast: Secondary | ICD-10-CM

## 2018-12-27 NOTE — Progress Notes (Signed)
Carelink Summary Report / Loop Recorder 

## 2019-01-23 ENCOUNTER — Ambulatory Visit (INDEPENDENT_AMBULATORY_CARE_PROVIDER_SITE_OTHER): Payer: Medicare PPO | Admitting: *Deleted

## 2019-01-23 DIAGNOSIS — I480 Paroxysmal atrial fibrillation: Secondary | ICD-10-CM

## 2019-01-23 LAB — CUP PACEART REMOTE DEVICE CHECK
Date Time Interrogation Session: 20200919233805
Implantable Pulse Generator Implant Date: 20180807

## 2019-01-26 DIAGNOSIS — G4733 Obstructive sleep apnea (adult) (pediatric): Secondary | ICD-10-CM | POA: Diagnosis not present

## 2019-01-30 NOTE — Progress Notes (Signed)
Carelink Summary Report / Loop Recorder 

## 2019-01-31 DIAGNOSIS — G4733 Obstructive sleep apnea (adult) (pediatric): Secondary | ICD-10-CM | POA: Diagnosis not present

## 2019-02-23 ENCOUNTER — Ambulatory Visit (INDEPENDENT_AMBULATORY_CARE_PROVIDER_SITE_OTHER): Payer: Medicare PPO | Admitting: *Deleted

## 2019-02-23 DIAGNOSIS — I480 Paroxysmal atrial fibrillation: Secondary | ICD-10-CM | POA: Diagnosis not present

## 2019-02-25 LAB — CUP PACEART REMOTE DEVICE CHECK
Date Time Interrogation Session: 20201022234013
Implantable Pulse Generator Implant Date: 20180807

## 2019-03-07 NOTE — Progress Notes (Signed)
Carelink Summary Report / Loop Recorder 

## 2019-03-22 ENCOUNTER — Telehealth: Payer: Self-pay | Admitting: Family Medicine

## 2019-03-22 NOTE — Telephone Encounter (Signed)
Pt has been schedule for AWV  04-06-2019

## 2019-03-22 NOTE — Telephone Encounter (Signed)
I left a message asking the patient to call and schedule Medicare AWV with Courtney ([facility]).  If patient calls back, please schedule Medicare Wellness Visit at next available opening.  VDM (Dee-Dee) °

## 2019-03-28 ENCOUNTER — Ambulatory Visit (INDEPENDENT_AMBULATORY_CARE_PROVIDER_SITE_OTHER): Payer: Medicare PPO | Admitting: *Deleted

## 2019-03-28 DIAGNOSIS — I480 Paroxysmal atrial fibrillation: Secondary | ICD-10-CM | POA: Diagnosis not present

## 2019-03-29 ENCOUNTER — Other Ambulatory Visit: Payer: Self-pay | Admitting: *Deleted

## 2019-03-29 DIAGNOSIS — Z01812 Encounter for preprocedural laboratory examination: Secondary | ICD-10-CM

## 2019-03-29 DIAGNOSIS — I480 Paroxysmal atrial fibrillation: Secondary | ICD-10-CM

## 2019-03-29 DIAGNOSIS — I10 Essential (primary) hypertension: Secondary | ICD-10-CM

## 2019-03-29 LAB — CUP PACEART REMOTE DEVICE CHECK
Date Time Interrogation Session: 20201124183924
Implantable Pulse Generator Implant Date: 20180807

## 2019-04-06 ENCOUNTER — Ambulatory Visit: Payer: Medicare PPO

## 2019-04-07 ENCOUNTER — Other Ambulatory Visit: Payer: Medicare PPO

## 2019-04-10 ENCOUNTER — Other Ambulatory Visit: Payer: Medicare PPO | Admitting: *Deleted

## 2019-04-10 ENCOUNTER — Other Ambulatory Visit: Payer: Self-pay

## 2019-04-10 ENCOUNTER — Encounter (INDEPENDENT_AMBULATORY_CARE_PROVIDER_SITE_OTHER): Payer: Self-pay

## 2019-04-10 DIAGNOSIS — Z01812 Encounter for preprocedural laboratory examination: Secondary | ICD-10-CM | POA: Diagnosis not present

## 2019-04-10 DIAGNOSIS — I480 Paroxysmal atrial fibrillation: Secondary | ICD-10-CM | POA: Diagnosis not present

## 2019-04-10 DIAGNOSIS — I10 Essential (primary) hypertension: Secondary | ICD-10-CM | POA: Diagnosis not present

## 2019-04-10 LAB — BASIC METABOLIC PANEL
BUN/Creatinine Ratio: 14 (ref 12–28)
BUN: 13 mg/dL (ref 8–27)
CO2: 27 mmol/L (ref 20–29)
Calcium: 9.6 mg/dL (ref 8.7–10.3)
Chloride: 100 mmol/L (ref 96–106)
Creatinine, Ser: 0.9 mg/dL (ref 0.57–1.00)
GFR calc Af Amer: 72 mL/min/{1.73_m2} (ref >59)
GFR calc non Af Amer: 62 mL/min/{1.73_m2} (ref >59)
Glucose: 111 mg/dL — ABNORMAL HIGH (ref 65–99)
Potassium: 4.2 mmol/L (ref 3.5–5.2)
Sodium: 142 mmol/L (ref 134–144)

## 2019-04-11 ENCOUNTER — Inpatient Hospital Stay: Admission: RE | Admit: 2019-04-11 | Payer: Medicare PPO | Source: Ambulatory Visit

## 2019-04-18 ENCOUNTER — Ambulatory Visit (INDEPENDENT_AMBULATORY_CARE_PROVIDER_SITE_OTHER)
Admission: RE | Admit: 2019-04-18 | Discharge: 2019-04-18 | Disposition: A | Discharge status: Home / Self Care | Payer: Medicare PPO | Source: Ambulatory Visit | Attending: Internal Medicine | Admitting: Internal Medicine

## 2019-04-18 ENCOUNTER — Other Ambulatory Visit: Payer: Self-pay

## 2019-04-18 DIAGNOSIS — I712 Thoracic aortic aneurysm, without rupture: Secondary | ICD-10-CM | POA: Diagnosis not present

## 2019-04-18 DIAGNOSIS — I7121 Aneurysm of the ascending aorta, without rupture: Secondary | ICD-10-CM

## 2019-04-18 MED ORDER — IOHEXOL 350 MG/ML SOLN
100.0000 mL | Freq: Once | INTRAVENOUS | Status: AC | PRN
  Administered 2019-04-18: 09:00:00 100 mL via INTRAVENOUS

## 2019-05-01 ENCOUNTER — Ambulatory Visit (INDEPENDENT_AMBULATORY_CARE_PROVIDER_SITE_OTHER): Payer: Medicare PPO | Admitting: *Deleted

## 2019-05-01 DIAGNOSIS — I480 Paroxysmal atrial fibrillation: Secondary | ICD-10-CM

## 2019-05-01 LAB — CUP PACEART REMOTE DEVICE CHECK
Date Time Interrogation Session: 20201227183927
Implantable Pulse Generator Implant Date: 20180807

## 2019-05-02 NOTE — Progress Notes (Signed)
ILR remote 

## 2019-05-08 ENCOUNTER — Telehealth: Payer: Self-pay

## 2019-05-08 NOTE — Telephone Encounter (Signed)
I spoke with the pt and she states she felt like she was having some Afib for a couple of days. She states she feel much better today. She also states she might have had a little more wine for new year's and maybe that what made her feel the way she did. I told her the nurse will review it and if she see something she will get a call back. If everything looks normal she will not get a phone call.

## 2019-05-26 DIAGNOSIS — H00021 Hordeolum internum right upper eyelid: Secondary | ICD-10-CM | POA: Diagnosis not present

## 2019-05-30 ENCOUNTER — Encounter (HOSPITAL_COMMUNITY): Payer: Self-pay | Admitting: Nurse Practitioner

## 2019-05-30 ENCOUNTER — Other Ambulatory Visit: Payer: Self-pay

## 2019-05-30 ENCOUNTER — Ambulatory Visit (HOSPITAL_COMMUNITY)
Admission: RE | Admit: 2019-05-30 | Discharge: 2019-05-30 | Disposition: A | Discharge status: Home / Self Care | Payer: Medicare PPO | Source: Ambulatory Visit | Attending: Nurse Practitioner | Admitting: Nurse Practitioner

## 2019-05-30 VITALS — BP 116/60 | HR 69 | Ht 64.0 in | Wt 204.0 lb

## 2019-05-30 DIAGNOSIS — Z823 Family history of stroke: Secondary | ICD-10-CM | POA: Diagnosis not present

## 2019-05-30 DIAGNOSIS — I10 Essential (primary) hypertension: Secondary | ICD-10-CM | POA: Insufficient documentation

## 2019-05-30 DIAGNOSIS — I480 Paroxysmal atrial fibrillation: Secondary | ICD-10-CM | POA: Diagnosis not present

## 2019-05-30 DIAGNOSIS — Z87891 Personal history of nicotine dependence: Secondary | ICD-10-CM | POA: Diagnosis not present

## 2019-05-30 DIAGNOSIS — Z79899 Other long term (current) drug therapy: Secondary | ICD-10-CM | POA: Diagnosis not present

## 2019-05-30 DIAGNOSIS — Z820 Family history of epilepsy and other diseases of the nervous system: Secondary | ICD-10-CM | POA: Insufficient documentation

## 2019-05-30 DIAGNOSIS — K219 Gastro-esophageal reflux disease without esophagitis: Secondary | ICD-10-CM | POA: Diagnosis not present

## 2019-05-30 DIAGNOSIS — Z7982 Long term (current) use of aspirin: Secondary | ICD-10-CM | POA: Diagnosis not present

## 2019-05-30 DIAGNOSIS — G4733 Obstructive sleep apnea (adult) (pediatric): Secondary | ICD-10-CM | POA: Insufficient documentation

## 2019-05-30 NOTE — Progress Notes (Signed)
Primary Care Physician: Vivi Barrack, MD Referring Physician: Dr. Ruffin Frederick. Brenda [name] is a 79 y.o. female with a h/o paroxysmal afib with a Linq for surveillance that is here for f/u.  She reports that she is doing well.She did have 2.5 hours of afib in May 2020 that she was unaware of. Her afib burden is low at 0.1 %. She has a CHA2DS2VASc score of 4 and is not on anticoagulation. She has deferred in the past unless her burden significantly increases by Linq.  In for f/u 05/30/19. She feels well,no issues with afib. Last linq in December showing no arrhythmia, as well as for the last 6 months.   Today, she denies symptoms of palpitations, chest pain, shortness of breath, orthopnea, PND, lower extremity edema, dizziness, presyncope, syncope, or neurologic sequela. The patient is tolerating medications without difficulties and is otherwise without complaint today.   Past Medical History:  Diagnosis Date  . Diverticulosis   . GERD (gastroesophageal reflux disease)   . Hypertension   . OSA (obstructive sleep apnea)    severe  . Paroxysmal A-fib [facility])    s/p afib ablation at [facility]   Past Surgical History:  Procedure Laterality Date  . [place]  . CHOLECYSTECTOMY  1977  . COLON RESECTION  2005   due to diverticulitis  . LOOP RECORDER INSERTION N/A 12/08/2016   Procedure: LOOP RECORDER INSERTION;  Surgeon: Thompson Grayer, MD;  Location: [facility];  Service: Cardiovascular;  Laterality: N/A;  . TOTAL ABDOMINAL HYSTERECTOMY  1985    Current Outpatient Medications  Medication Sig Dispense Refill  . albuterol (VENTOLIN HFA) 108 (90 Base) MCG/ACT inhaler Inhale 2 puffs into the lungs every 6 (six) hours as needed for wheezing or shortness of breath. 6.7 g 2  . aspirin 81 MG tablet Take 81 mg by mouth daily.    Marland Kitchen atorvastatin (LIPITOR) 40 MG tablet Take 1 tablet (40 mg total) by mouth daily. 90 tablet 3  . cephALEXin (KEFLEX) 500 MG  capsule     . furosemide (LASIX) 20 MG tablet Take 20 mg by mouth daily as needed for fluid.     Marland Kitchen loratadine (CLARITIN) 10 MG tablet Take 10 mg by mouth daily.     . metoprolol succinate (TOPROL-XL) 25 MG 24 hr tablet Take 0.5 tablets (12.5 mg total) by mouth daily. 90 tablet 3  . omeprazole (PRILOSEC) 20 MG capsule Take 1 capsule (20 mg total) by mouth daily. 90 capsule 3  . Specialty Vitamins Products (BIOTIN PLUS KERATIN) 10000-100 MCG-MG TABS Take by mouth.     No current facility-administered medications for this encounter.    No Known Allergies  Social History   Socioeconomic History  . Marital status: Divorced    Spouse name: Not on file  . Number of children: Not on file  . Years of education: Not on file  . Highest education level: Not on file  Occupational History  . Not on file  Tobacco Use  . Smoking status: Former Smoker    Packs/day: 0.50    Years: 48.00    Pack years: 24.00    Types: Cigarettes    Start date: 42    Quit date: 07/03/2006    Years since quitting: 12.9  . Smokeless tobacco: Never Used  Substance and Sexual Activity  . Alcohol use: Yes    Alcohol/week: 1.0 standard drinks    Types: 1 Glasses of wine  per week    Comment: every 3-4 weeks  . Drug use: No  . Sexual activity: Never  Other Topics Concern  . Not on file  Social History Narrative   Recently moved to [place] from [place]   Retired Tourist information centre manager   Social Determinants of Health   Financial Resource Strain:   . Difficulty of Paying Living Expenses: Not on file  Food Insecurity:   . Worried About Programme researcher, broadcasting/film/video in the Last Year: Not on file  . Ran Out of Food in the Last Year: Not on file  Transportation Needs:   . Lack of Transportation (Medical): Not on file  . Lack of Transportation (Non-Medical): Not on file  Physical Activity:   . Days of Exercise per Week: Not on file  . Minutes of Exercise per Session: Not on file  Stress:   . Feeling of Stress : Not on  file  Social Connections:   . Frequency of Communication with Friends and Family: Not on file  . Frequency of Social Gatherings with Friends and Family: Not on file  . Attends Religious Services: Not on file  . Active Member of Clubs or Organizations: Not on file  . Attends Banker Meetings: Not on file  . Marital Status: Not on file  Intimate Partner Violence:   . Fear of Current or Ex-Partner: Not on file  . Emotionally Abused: Not on file  . Physically Abused: Not on file  . Sexually Abused: Not on file    Family History  Problem Relation Age of Onset  . Stroke Mother   . Parkinson's disease Mother     ROS- All systems are reviewed and negative except as per the HPI above  Physical Exam: Vitals:   05/30/19 0857  BP: 116/60  Pulse: 69  Weight: 92.5 kg  Height: 5\' 4"  (1.626 m)   Wt Readings from Last 3 Encounters:  05/30/19 92.5 kg  11/18/18 91.3 kg  11/17/18 91.5 kg    Labs: Lab Results  Component Value Date   NA 142 04/10/2019   K 4.2 04/10/2019   CL 100 04/10/2019   CO2 27 04/10/2019   GLUCOSE 111 (H) 04/10/2019   BUN 13 04/10/2019   CREATININE 0.90 04/10/2019   CALCIUM 9.6 04/10/2019   No results found for: INR Lab Results  Component Value Date   CHOL 122 10/24/2018   HDL 56.90 10/24/2018   LDLCALC 48 10/24/2018   TRIG 89.0 10/24/2018     GEN- The patient is well appearing, alert and oriented x 3 today.   Head- normocephalic, atraumatic Eyes-  Sclera clear, conjunctiva pink Ears- hearing intact Oropharynx- clear Neck- supple, no JVP Lymph- no cervical lymphadenopathy Lungs- Clear to ausculation bilaterally, normal work of breathing Heart- Regular rate and rhythm, no murmurs, rubs or gallops, PMI not laterally displaced GI- soft, NT, ND, + BS Extremities- no clubbing, cyanosis, or edema MS- no significant deformity or atrophy Skin- no rash or lesion Psych- euthymic mood, full affect Neuro- strength and sensation are  intact  EKG-NSR at 69 bpm with PAC's linq report reviewed  Linq-AF episode noted on LINQ on 09/14/18, duration 2.5hrs. 0.0% lifetime AF burden, current burden 0.1% (03/28/18-09/15/18). Per last OV note from Dr. 09/17/18, patient not currently on [facility] but may be willing to consider if AF burden increases. So far she has had very little afib and still wants to carefully watch burden thru linq.    Assessment and Plan: 1. Paroxysmal afib  Burden very low thru Linq  Continue BB without change  2. CHA2DS2VASc score of 4 afib burden is 0.1% Discussed going on anticoagulation She prefers to wait until afib burden is higher  Device clinic notified in the past to have her flagged to be notified if any showing of afib so she can further decide on starting anticoagualtion  F/u here in one year,  sooner if  notified thru device clinic a change in afib burden   Lupita Leash C. [facility] [address] [place], [place] [address] [phone]

## 2019-06-01 ENCOUNTER — Other Ambulatory Visit: Payer: Self-pay

## 2019-06-01 ENCOUNTER — Ambulatory Visit (INDEPENDENT_AMBULATORY_CARE_PROVIDER_SITE_OTHER): Payer: Medicare PPO | Admitting: *Deleted

## 2019-06-01 DIAGNOSIS — G4733 Obstructive sleep apnea (adult) (pediatric): Secondary | ICD-10-CM | POA: Diagnosis not present

## 2019-06-01 DIAGNOSIS — I480 Paroxysmal atrial fibrillation: Secondary | ICD-10-CM | POA: Diagnosis not present

## 2019-06-01 LAB — CUP PACEART REMOTE DEVICE CHECK
Date Time Interrogation Session: 20210128024616
Implantable Pulse Generator Implant Date: 20180807

## 2019-06-02 ENCOUNTER — Ambulatory Visit (INDEPENDENT_AMBULATORY_CARE_PROVIDER_SITE_OTHER): Payer: Medicare PPO

## 2019-06-02 ENCOUNTER — Encounter: Payer: Self-pay | Admitting: Family Medicine

## 2019-06-02 ENCOUNTER — Ambulatory Visit (INDEPENDENT_AMBULATORY_CARE_PROVIDER_SITE_OTHER): Payer: Medicare PPO | Admitting: Family Medicine

## 2019-06-02 VITALS — BP 108/64 | HR 59 | Temp 97.2°F | Ht 64.0 in | Wt 206.6 lb

## 2019-06-02 DIAGNOSIS — K219 Gastro-esophageal reflux disease without esophagitis: Secondary | ICD-10-CM

## 2019-06-02 DIAGNOSIS — E669 Obesity, unspecified: Secondary | ICD-10-CM

## 2019-06-02 DIAGNOSIS — Z0001 Encounter for general adult medical examination with abnormal findings: Secondary | ICD-10-CM | POA: Diagnosis not present

## 2019-06-02 DIAGNOSIS — E785 Hyperlipidemia, unspecified: Secondary | ICD-10-CM

## 2019-06-02 DIAGNOSIS — J302 Other seasonal allergic rhinitis: Secondary | ICD-10-CM

## 2019-06-02 DIAGNOSIS — Z6835 Body mass index (BMI) 35.0-35.9, adult: Secondary | ICD-10-CM | POA: Diagnosis not present

## 2019-06-02 DIAGNOSIS — Z0000 Encounter for general adult medical examination without abnormal findings: Secondary | ICD-10-CM

## 2019-06-02 DIAGNOSIS — L659 Nonscarring hair loss, unspecified: Secondary | ICD-10-CM | POA: Diagnosis not present

## 2019-06-02 DIAGNOSIS — Z13220 Encounter for screening for lipoid disorders: Secondary | ICD-10-CM | POA: Diagnosis not present

## 2019-06-02 DIAGNOSIS — I480 Paroxysmal atrial fibrillation: Secondary | ICD-10-CM

## 2019-06-02 DIAGNOSIS — G4733 Obstructive sleep apnea (adult) (pediatric): Secondary | ICD-10-CM

## 2019-06-02 DIAGNOSIS — I10 Essential (primary) hypertension: Secondary | ICD-10-CM

## 2019-06-02 DIAGNOSIS — M1990 Unspecified osteoarthritis, unspecified site: Secondary | ICD-10-CM

## 2019-06-02 LAB — LIPID PANEL
Cholesterol: 148 mg/dL (ref 0–200)
HDL: 65.4 mg/dL (ref >39.00)
LDL Cholesterol: 67 mg/dL (ref 0–99)
NonHDL: 82.56
Total CHOL/HDL Ratio: 2
Triglycerides: 77 mg/dL (ref 0.0–149.0)
VLDL: 15.4 mg/dL (ref 0.0–40.0)

## 2019-06-02 LAB — COMPREHENSIVE METABOLIC PANEL
ALT: 18 U/L (ref 0–35)
AST: 17 U/L (ref 0–37)
Albumin: 4.1 g/dL (ref 3.5–5.2)
Alkaline Phosphatase: 88 U/L (ref 39–117)
BUN: 12 mg/dL (ref 6–23)
CO2: 30 mEq/L (ref 19–32)
Calcium: 9.5 mg/dL (ref 8.4–10.5)
Chloride: 105 mEq/L (ref 96–112)
Creatinine, Ser: 0.82 mg/dL (ref 0.40–1.20)
GFR: 67.59 mL/min (ref >60.00)
Glucose, Bld: 92 mg/dL (ref 70–99)
Potassium: 4 mEq/L (ref 3.5–5.1)
Sodium: 140 mEq/L (ref 135–145)
Total Bilirubin: 0.6 mg/dL (ref 0.2–1.2)
Total Protein: 6.9 g/dL (ref 6.0–8.3)

## 2019-06-02 LAB — VITAMIN D 25 HYDROXY (VIT D DEFICIENCY, FRACTURES): VITD: 12.79 ng/mL — ABNORMAL LOW (ref 30.00–100.00)

## 2019-06-02 LAB — CBC
HCT: 41.6 % (ref 36.0–46.0)
Hemoglobin: 13.5 g/dL (ref 12.0–15.0)
MCHC: 32.5 g/dL (ref 30.0–36.0)
MCV: 93.6 fl (ref 78.0–100.0)
Platelets: 199 10*3/uL (ref 150.0–400.0)
RBC: 4.44 Mil/uL (ref 3.87–5.11)
RDW: 13.9 % (ref 11.5–15.5)
WBC: 6.5 10*3/uL (ref 4.0–10.5)

## 2019-06-02 LAB — IBC + FERRITIN
Ferritin: 41.1 ng/mL (ref 10.0–291.0)
Iron: 69 ug/dL (ref 42–145)
Saturation Ratios: 20.2 % (ref 20.0–50.0)
Transferrin: 244 mg/dL (ref 212.0–360.0)

## 2019-06-02 LAB — TSH: TSH: 3.9 u[IU]/mL (ref 0.35–4.50)

## 2019-06-02 LAB — VITAMIN B12: Vitamin B-12: 170 pg/mL — ABNORMAL LOW (ref 211–911)

## 2019-06-02 MED ORDER — FUROSEMIDE 20 MG PO TABS: 20.0000 mg | ORAL_TABLET | Freq: Every day | ORAL | 5 refills | Status: DC | PRN

## 2019-06-02 NOTE — Assessment & Plan Note (Signed)
At goal.  Continue metoprolol succinate 12.5 mg daily and Lasix 20 mg daily as needed.  Check CBC, C met, TSH.

## 2019-06-02 NOTE — Progress Notes (Signed)
ILR Remote 

## 2019-06-02 NOTE — Assessment & Plan Note (Signed)
Continue Lipitor 40 mg daily Check lipid panel today  

## 2019-06-02 NOTE — Progress Notes (Signed)
Chief Complaint:  Brenda [name] is a 79 y.o. female who presents today for her annual comprehensive physical exam.    Assessment/Plan:  New/Acute Problems: Alopecia No red flags.  Will check labs including CBC, TSH, iron panel, B12, and vitamin D.  No abnormalities would consider topical Rogaine.  Right hip pain No red flags.  Likely mild gluteal strain.  Discussed home exercises and handout was given.  Chronic Problems Addressed Today: Dyslipidemia Continue Lipitor 40 mg daily.  Check lipid panel today.  Seasonal allergies Stable.  Continue Claritin as needed seasonally.  GERD (gastroesophageal reflux disease) Stable.  Continue Prilosec 20 mg daily.  Hypertension At goal.  Continue metoprolol succinate 12.5 mg daily and Lasix 20 mg daily as needed.  Check CBC, C met, TSH.  Paroxysmal A-fib (HCC) Regular rate today.  Continue management per cardiology.   Body mass index is 35.46 kg/m. / Obese BMI Metric Follow Up - 06/02/19 1028      BMI Metric Follow Up-Please document annually   BMI Metric Follow Up  Education provided        Preventative Healthcare: Discussed Covid vaccine.  She will be getting her second dose in a couple of weeks.  She is up-to-date on other vaccines and screenings.  She is getting yearly mammograms.  Would like to have one more colonoscopy done before age 38.  Patient Counseling(The following topics were reviewed and/or handout was given):  -Nutrition: Stressed importance of moderation in sodium/caffeine intake, saturated fat and cholesterol, caloric balance, sufficient intake of fresh fruits, vegetables, and fiber.  -Stressed the importance of regular exercise.   -Substance Abuse: Discussed cessation/primary prevention of tobacco, alcohol, or other drug use; driving or other dangerous activities under the influence; availability of treatment for abuse.   -Injury prevention: Discussed safety belts, safety helmets, smoke detector, smoking near  bedding or upholstery.   -Sexuality: Discussed sexually transmitted diseases, partner selection, use of condoms, avoidance of unintended pregnancy and contraceptive alternatives.   -Dental health: Discussed importance of regular tooth brushing, flossing, and dental visits.  -Health maintenance and immunizations reviewed. Please refer to Health maintenance section.  Return to care in 1 year for next preventative visit.     Subjective:  HPI:  She has no acute complaints today.   She has been having some hair loss for last couple of months.  Her hairdresser advised her to get blood work done.  No obvious causes.  No treatments tried.  She has also had some right hip pain for a couple of months.  No pain at rest or walking.  Only notices slight twinge of pain when climbing stairs.  No obvious injuries or other precipitating events.  Lifestyle Diet: Balanced. Plenty of fruits and vegetables.  Exercise: Walks daily.   Depression screen [facility] 2/9 10/24/2018  Decreased Interest 0  Down, Depressed, Hopeless 0  PHQ - 2 Score 0  Altered sleeping -  Tired, decreased energy -  Change in appetite -  Feeling bad or failure about yourself  -  Trouble concentrating -  Moving slowly or fidgety/restless -  Suicidal thoughts -  PHQ-9 Score -  Difficult doing work/chores -    There are no preventive care reminders to display for this patient.   ROS: Per HPI, otherwise a complete review of systems was negative.   PMH:  The following were reviewed and entered/updated in epic: Past Medical History:  Diagnosis Date  . Diverticulosis   . GERD (gastroesophageal reflux disease)   .  Hypertension   . OSA (obstructive sleep apnea)    severe  . Paroxysmal A-fib [facility])    s/p afib ablation at [facility]   Patient Active Problem List   Diagnosis Date Noted  . Seasonal allergies 10/24/2018  . Dyslipidemia 10/24/2018  . Osteoarthritis 07/06/2017  . Paroxysmal A-fib ([place])   . OSA (obstructive sleep  apnea)   . Hypertension   . GERD (gastroesophageal reflux disease)   . Diverticulosis    Past Surgical History:  Procedure Laterality Date  . [place]  . CATARACT EXTRACTION, BILATERAL  2020  . CHOLECYSTECTOMY  1977  . COLON RESECTION  2005   due to diverticulitis  . LOOP RECORDER INSERTION N/A 12/08/2016   Procedure: LOOP RECORDER INSERTION;  Surgeon: Thompson Grayer, MD;  Location: [facility];  Service: Cardiovascular;  Laterality: N/A;  . TOTAL ABDOMINAL HYSTERECTOMY  1985    Family History  Problem Relation Age of Onset  . Stroke Mother   . Parkinson's disease Mother     Medications- reviewed and updated Current Outpatient Medications  Medication Sig Dispense Refill  . albuterol (VENTOLIN HFA) 108 (90 Base) MCG/ACT inhaler Inhale 2 puffs into the lungs every 6 (six) hours as needed for wheezing or shortness of breath. 6.7 g 2  . aspirin 81 MG tablet Take 81 mg by mouth daily.    Marland Kitchen atorvastatin (LIPITOR) 40 MG tablet Take 1 tablet (40 mg total) by mouth daily. 90 tablet 3  . Biotin 1000 MCG tablet Take 1,000 mcg by mouth 3 (three) times daily.    . cephALEXin (KEFLEX) 500 MG capsule     . loratadine (CLARITIN) 10 MG tablet Take 10 mg by mouth daily.     . metoprolol succinate (TOPROL-XL) 25 MG 24 hr tablet Take 0.5 tablets (12.5 mg total) by mouth daily. 90 tablet 3  . omeprazole (PRILOSEC) 20 MG capsule Take 1 capsule (20 mg total) by mouth daily. 90 capsule 3  . Specialty Vitamins Products (BIOTIN PLUS KERATIN) 10000-100 MCG-MG TABS Take by mouth.    . furosemide (LASIX) 20 MG tablet Take 1 tablet (20 mg total) by mouth daily as needed for fluid. 30 tablet 5   No current facility-administered medications for this visit.    Allergies-reviewed and updated No Known Allergies  Social History   Socioeconomic History  . Marital status: Divorced    Spouse name: Not on file  . Number of children: Not on file  . Years of  education: Not on file  . Highest education level: Not on file  Occupational History  . Not on file  Tobacco Use  . Smoking status: Former Smoker    Packs/day: 0.50    Years: 48.00    Pack years: 24.00    Types: Cigarettes    Start date: 68    Quit date: 07/03/2006    Years since quitting: 12.9  . Smokeless tobacco: Never Used  Substance and Sexual Activity  . Alcohol use: Yes    Alcohol/week: 1.0 standard drinks    Types: 1 Glasses of wine per week    Comment: every 3-4 weeks  . Drug use: No  . Sexual activity: Never  Other Topics Concern  . Not on file  Social History Narrative   Recently moved to [place] from [place]   Retired Automotive engineer   Social Determinants of Health   Financial Resource Strain:   . Difficulty of Paying Living Expenses: Not on  file  Food Insecurity:   . Worried About Charity fundraiser in the Last Year: Not on file  . Ran Out of Food in the Last Year: Not on file  Transportation Needs:   . Lack of Transportation (Medical): Not on file  . Lack of Transportation (Non-Medical): Not on file  Physical Activity:   . Days of Exercise per Week: Not on file  . Minutes of Exercise per Session: Not on file  Stress:   . Feeling of Stress : Not on file  Social Connections:   . Frequency of Communication with Friends and Family: Not on file  . Frequency of Social Gatherings with Friends and Family: Not on file  . Attends Religious Services: Not on file  . Active Member of Clubs or Organizations: Not on file  . Attends Archivist Meetings: Not on file  . Marital Status: Not on file        Objective:  Physical Exam: BP 108/64   Pulse (!) 59   Temp (!) 97.2 F (36.2 C) (Temporal)   Ht '5\' 4"'  (1.626 m)   Wt 206 lb 9.6 oz (93.7 kg)   SpO2 99%   BMI 35.46 kg/m   Body mass index is 35.46 kg/m. Wt Readings from Last 3 Encounters:  06/02/19 206 lb 9.6 oz (93.7 kg)  05/30/19 204 lb (92.5 kg)  11/18/18 201 lb 3.2 oz (91.3 kg)    Gen: NAD, resting comfortably HEENT: TMs normal bilaterally. OP clear. No thyromegaly noted.  CV: RRR with no murmurs appreciated Pulm: NWOB, CTAB with no crackles, wheezes, or rhonchi GI: Normal bowel sounds present. Soft, Nontender, Nondistended. MSK: no edema, cyanosis, or clubbing noted.  Right lower extremity with no deformities.  Nontender to palpation.  No pain with internal or external rotation of right hip.  No pain with resisted abduction or flexion.  Skin: warm, dry Neuro: CN2-12 grossly intact. Strength 5/5 in upper and lower extremities. Reflexes symmetric and intact bilaterally.  Psych: Normal affect and thought content     Brenda [name] M. Jerline Pain, MD 06/02/2019 10:34 AM

## 2019-06-02 NOTE — Progress Notes (Signed)
Subjective:   Brenda [name] is a 79 y.o. female who presents for an Initial Medicare Annual Wellness Visit.  Review of Systems     Cardiac Risk Factors include: advanced age (>36men, >77 women);hypertension;dyslipidemia    Objective:    Today's Vitals   06/02/19 1224  BP: 108/64  Pulse: (!) 59  Temp: (!) 97.2 F (36.2 C)  TempSrc: Temporal  Weight: 206 lb 9.6 oz (93.7 kg)  Height: 5\' 4"  (1.626 m)   Body mass index is 35.46 kg/m.  Advanced Directives 06/02/2019 08/24/2018 12/08/2016  Does Patient Have a Medical Advance Directive? Yes Yes Yes  Type of Paramedic of [place];Living will [place];Living will Living will  Does patient want to make changes to medical advance directive? No - Patient declined - No - Patient declined  Copy of [place] in Chart? No - copy requested - -    Current Medications (verified) Outpatient Encounter Medications as of 06/02/2019  Medication Sig  . Biotin 1000 MCG tablet Take 1,000 mcg by mouth 3 (three) times daily.  Marland Kitchen albuterol (VENTOLIN HFA) 108 (90 Base) MCG/ACT inhaler Inhale 2 puffs into the lungs every 6 (six) hours as needed for wheezing or shortness of breath.  Marland Kitchen aspirin 81 MG tablet Take 81 mg by mouth daily.  Marland Kitchen atorvastatin (LIPITOR) 40 MG tablet Take 1 tablet (40 mg total) by mouth daily.  . cephALEXin (KEFLEX) 500 MG capsule   . loratadine (CLARITIN) 10 MG tablet Take 10 mg by mouth daily.   . metoprolol succinate (TOPROL-XL) 25 MG 24 hr tablet Take 0.5 tablets (12.5 mg total) by mouth daily.  Marland Kitchen omeprazole (PRILOSEC) 20 MG capsule Take 1 capsule (20 mg total) by mouth daily.  Marland Kitchen Specialty Vitamins Products (BIOTIN PLUS KERATIN) 10000-100 MCG-MG TABS Take by mouth.  . [DISCONTINUED] furosemide (LASIX) 20 MG tablet Take 20 mg by mouth daily as needed for fluid.    No facility-administered encounter medications on file as of 06/02/2019.    Allergies (verified) Patient  has no known allergies.   History: Past Medical History:  Diagnosis Date  . Diverticulosis   . GERD (gastroesophageal reflux disease)   . Hypertension   . OSA (obstructive sleep apnea)    severe  . Paroxysmal A-fib [facility])    s/p afib ablation at [facility]   Past Surgical History:  Procedure Laterality Date  . [place]  . CATARACT EXTRACTION, BILATERAL  2020  . CHOLECYSTECTOMY  1977  . COLON RESECTION  2005   due to diverticulitis  . LOOP RECORDER INSERTION N/A 12/08/2016   Procedure: LOOP RECORDER INSERTION;  Surgeon: Thompson Grayer, MD;  Location: [facility];  Service: Cardiovascular;  Laterality: N/A;  . TOTAL ABDOMINAL HYSTERECTOMY  1985   Family History  Problem Relation Age of Onset  . Stroke Mother   . Parkinson's disease Mother    Social History   Socioeconomic History  . Marital status: Divorced    Spouse name: Not on file  . Number of children: Not on file  . Years of education: Not on file  . Highest education level: Not on file  Occupational History  . Not on file  Tobacco Use  . Smoking status: Former Smoker    Packs/day: 0.50    Years: 48.00    Pack years: 24.00    Types: Cigarettes    Start date: 1960    Quit date:  07/03/2006    Years since quitting: 12.9  . Smokeless tobacco: Never Used  Substance and Sexual Activity  . Alcohol use: Yes    Alcohol/week: 1.0 standard drinks    Types: 1 Glasses of wine per week    Comment: every 3-4 weeks  . Drug use: No  . Sexual activity: Never  Other Topics Concern  . Not on file  Social History Narrative   Recently moved to [place] from [place]   Retired Tourist information centre manager   Social Determinants of Health   Financial Resource Strain:   . Difficulty of Paying Living Expenses: Not on file  Food Insecurity:   . Worried About Programme researcher, broadcasting/film/video in the Last Year: Not on file  . Ran Out of Food in the Last Year: Not on file  Transportation Needs:   . Lack of  Transportation (Medical): Not on file  . Lack of Transportation (Non-Medical): Not on file  Physical Activity:   . Days of Exercise per Week: Not on file  . Minutes of Exercise per Session: Not on file  Stress:   . Feeling of Stress : Not on file  Social Connections:   . Frequency of Communication with Friends and Family: Not on file  . Frequency of Social Gatherings with Friends and Family: Not on file  . Attends Religious Services: Not on file  . Active Member of Clubs or Organizations: Not on file  . Attends Banker Meetings: Not on file  . Marital Status: Not on file    Tobacco Counseling Counseling given: Not Answered   Clinical Intake:  Pre-visit preparation completed: Yes  Pain : No/denies pain  Diabetes: No  How often do you need to have someone help you when you read instructions, pamphlets, or other written materials from your doctor or pharmacy?: 1 - Never  Interpreter Needed?: No  Information entered by :: Kandis Fantasia LPN   Activities of Daily Living In your present state of health, do you have any difficulty performing the following activities: 06/02/2019  Hearing? N  Vision? N  Difficulty concentrating or making decisions? N  Walking or climbing stairs? N  Dressing or bathing? N  Doing errands, shopping? N  Preparing Food and eating ? N  Using the Toilet? N  In the past six months, have you accidently leaked urine? N  Do you have problems with loss of bowel control? N  Managing your Medications? N  Managing your Finances? N  Housekeeping or managing your Housekeeping? N  Some recent data might be hidden     Immunizations and Health Maintenance Immunization History  Administered Date(s) Administered  . Fluad Quad(high Dose 65+) 02/06/2019  . Influenza Whole 02/15/2017  . Influenza, High Dose Seasonal PF 01/22/2017  . Influenza-Unspecified 02/14/2018  . Pneumococcal Conjugate-13 02/06/2019   There are no preventive care  reminders to display for this patient.  Patient Care Team: Ardith Dark, MD as PCP - General (Family Medicine) Hillis Range, MD as Consulting Physician (Cardiology) [name], MD as Consulting Physician (Pulmonary Disease) Rodrigo Ran, OD as Consulting Physician (Ophthalmology) Bjorn Pippin, MD as Consulting Physician (Orthopedic Surgery)  Indicate any recent Medical Services you may have received from other than [facility] providers in the past year (date may be approximate).     Assessment:   This is a routine wellness examination for Brenda [name].  Hearing/Vision screen No exam data present  Dietary issues and exercise activities discussed: Current Exercise Habits: Home exercise routine, Type  of exercise: walking, Time (Minutes): 30, Frequency (Times/Week): 5, Weekly Exercise (Minutes/Week): 150, Intensity: Mild  Goals   None    Depression Screen PHQ 2/9 Scores 06/02/2019 10/24/2018 07/06/2017 01/06/2017  PHQ - 2 Score 0 0 0 0  PHQ- 9 Score - - - 1    Fall Risk Fall Risk  06/02/2019 10/24/2018 07/06/2017  Falls in the past year? 0 - No  Number falls in past yr: 0 0 -  Injury with Fall? 0 1 -  Comment - wrist injury -  Follow up Falls evaluation completed;Education provided;Falls prevention discussed - -    Is the patient's home free of loose throw rugs in walkways, pet beds, electrical cords, etc?   yes      Grab bars in the bathroom? yes      Handrails on the stairs?   yes      Adequate lighting?   yes  Timed Get Up and Go Performed completed and within normal timeframe; no gait abnormalities noted   Cognitive Function:     6CIT Screen 06/02/2019  What Year? 0 points  What month? 0 points  What time? 0 points  Count back from 20 0 points  Months in reverse 0 points  Repeat phrase 0 points  Total Score 0    Screening Tests Health Maintenance  Topic Date Due  . DEXA SCAN  10/24/2019 (Originally 05/21/2007)  . TETANUS/TDAP  10/24/2019 (Originally 05/20/1959)  . PNA  vac Low Risk Adult (2 of 2 - PPSV23) 02/06/2020  . INFLUENZA VACCINE  Completed    Qualifies for Shingles Vaccine? Discussed and patient will check with pharmacy for coverage.  Patient education handout provided   Cancer Screenings: Lung: Low Dose CT Chest recommended if Age 64-80 years, 30 pack-year currently smoking OR have quit w/in 15years. Patient does not qualify. Breast: Up to date on Mammogram? Yes   Up to date of Bone Density/Dexa? No; patient will consider  Colorectal: No longer indicated    Plan:  I have personally reviewed and addressed the Medicare Annual Wellness questionnaire and have noted the following in the patient's chart:  A. Medical and social history B. Use of alcohol, tobacco or illicit drugs  C. Current medications and supplements D. Functional ability and status E.  Nutritional status F.  Physical activity G. Advance directives H. List of other physicians I.  Hospitalizations, surgeries, and ER visits in previous 12 months J.  Vitals K. Screenings such as hearing and vision if needed, cognitive and depression L. Referrals, records requested, and appointments- none   In addition, I have reviewed and discussed with patient certain preventive protocols, quality metrics, and best practice recommendations. A written personalized care plan for preventive services as well as general preventive health recommendations were provided to patient.   Signed,  Kandis Fantasia, LPN  Nurse Health Advisor   Nurse Notes: no additional

## 2019-06-02 NOTE — Patient Instructions (Addendum)
[name] , Thank you for taking time to come for your Medicare Wellness Visit. I appreciate your ongoing commitment to your health goals. Please review the following plan we discussed and let me know if I can assist you in the future.   Screening recommendations/referrals: Colorectal Screening: No longer indicated  Mammogram: up to date; last 12/21/18 Bone Density: recommended   Vision and Dental Exams: Recommended annual ophthalmology exams for early detection of glaucoma and other disorders of the eye Recommended annual dental exams for proper oral hygiene  Vaccinations: Influenza vaccine: completed 02/06/19 Pneumococcal vaccine: up to date; last 02/06/19 Tdap vaccine: recommended every 10 years  Shingles vaccine: Please call your insurance company to determine your out of pocket expense for the Shingrix vaccine. You may receive this vaccine at your local pharmacy. (see attached handouts)  Advanced directives: Please bring a copy of your POA (Power of Attorney) and/or Living Will to your next appointment.  Goals: Recommend to drink at least 6-8 8oz glasses of water per day and consume a balanced diet rich in fresh fruits and vegetables.   Next appointment: Please schedule your Annual Wellness Visit with your Nurse Health Advisor in one year.  Preventive Care 60 Years and Older, Female Preventive care refers to lifestyle choices and visits with your health care provider that can promote health and wellness. What does preventive care include?  A yearly physical exam. This is also called an annual well check.  Dental exams once or twice a year.  Routine eye exams. Ask your health care provider how often you should have your eyes checked.  Personal lifestyle choices, including:  Daily care of your teeth and gums.  Regular physical activity.  Eating a healthy diet.  Avoiding tobacco and drug use.  Limiting alcohol use.  Practicing safe sex.  Taking low-dose aspirin every day  if recommended by your health care provider.  Taking vitamin and mineral supplements as recommended by your health care provider. What happens during an annual well check? The services and screenings done by your health care provider during your annual well check will depend on your age, overall health, lifestyle risk factors, and family history of disease. Counseling  Your health care provider may ask you questions about your:  Alcohol use.  Tobacco use.  Drug use.  Emotional well-being.  Home and relationship well-being.  Sexual activity.  Eating habits.  History of falls.  Memory and ability to understand (cognition).  Work and work Astronomer.  Reproductive health. Screening  You may have the following tests or measurements:  Height, weight, and BMI.  Blood pressure.  Lipid and cholesterol levels. These may be checked every 5 years, or more frequently if you are over 62 years old.  Skin check.  Lung cancer screening. You may have this screening every year starting at age 32 if you have a 30-pack-year history of smoking and currently smoke or have quit within the past 15 years.  Fecal occult blood test (FOBT) of the stool. You may have this test every year starting at age 42.  Flexible sigmoidoscopy or colonoscopy. You may have a sigmoidoscopy every 5 years or a colonoscopy every 10 years starting at age 16.  Hepatitis C blood test.  Hepatitis B blood test.  Sexually transmitted disease (STD) testing.  Diabetes screening. This is done by checking your blood sugar (glucose) after you have not eaten for a while (fasting). You may have this done every 1-3 years.  Bone density scan. This is done  to screen for osteoporosis. You may have this done starting at age 52.  Mammogram. This may be done every 1-2 years. Talk to your health care provider about how often you should have regular mammograms. Talk with your health care provider about your test results,  treatment options, and if necessary, the need for more tests. Vaccines  Your health care provider may recommend certain vaccines, such as:  Influenza vaccine. This is recommended every year.  Tetanus, diphtheria, and acellular pertussis (Tdap, Td) vaccine. You may need a Td booster every 10 years.  Zoster vaccine. You may need this after age 60.  Pneumococcal 13-valent conjugate (PCV13) vaccine. One dose is recommended after age 44.  Pneumococcal polysaccharide (PPSV23) vaccine. One dose is recommended after age 53. Talk to your health care provider about which screenings and vaccines you need and how often you need them. This information is not intended to replace advice given to you by your health care provider. Make sure you discuss any questions you have with your health care provider. Document Released: 05/17/2015 Document Revised: 01/08/2016 Document Reviewed: 02/19/2015 Elsevier Interactive Patient Education  2017 [employer] Prevention in the Home Falls can cause injuries. They can happen to people of all ages. There are many things you can do to make your home safe and to help prevent falls. What can I do on the outside of my home?  Regularly fix the edges of walkways and driveways and fix any cracks.  Remove anything that might make you trip as you walk through a door, such as a raised step or threshold.  Trim any bushes or trees on the path to your home.  Use bright outdoor lighting.  Clear any walking paths of anything that might make someone trip, such as rocks or tools.  Regularly check to see if handrails are loose or broken. Make sure that both sides of any steps have handrails.  Any raised decks and porches should have guardrails on the edges.  Have any leaves, snow, or ice cleared regularly.  Use sand or salt on walking paths during winter.  Clean up any spills in your garage right away. This includes oil or grease spills. What can I do in the  bathroom?  Use night lights.  Install grab bars by the toilet and in the tub and shower. Do not use towel bars as grab bars.  Use non-skid mats or decals in the tub or shower.  If you need to sit down in the shower, use a plastic, non-slip stool.  Keep the floor dry. Clean up any water that spills on the floor as soon as it happens.  Remove soap buildup in the tub or shower regularly.  Attach bath mats securely with double-sided non-slip rug tape.  Do not have throw rugs and other things on the floor that can make you trip. What can I do in the bedroom?  Use night lights.  Make sure that you have a light by your bed that is easy to reach.  Do not use any sheets or blankets that are too big for your bed. They should not hang down onto the floor.  Have a firm chair that has side arms. You can use this for support while you get dressed.  Do not have throw rugs and other things on the floor that can make you trip. What can I do in the kitchen?  Clean up any spills right away.  Avoid walking on wet floors.  Keep items  that you use a lot in easy-to-reach places.  If you need to reach something above you, use a strong step stool that has a grab bar.  Keep electrical cords out of the way.  Do not use floor polish or wax that makes floors slippery. If you must use wax, use non-skid floor wax.  Do not have throw rugs and other things on the floor that can make you trip. What can I do with my stairs?  Do not leave any items on the stairs.  Make sure that there are handrails on both sides of the stairs and use them. Fix handrails that are broken or loose. Make sure that handrails are as long as the stairways.  Check any carpeting to make sure that it is firmly attached to the stairs. Fix any carpet that is loose or worn.  Avoid having throw rugs at the top or bottom of the stairs. If you do have throw rugs, attach them to the floor with carpet tape.  Make sure that you have a  light switch at the top of the stairs and the bottom of the stairs. If you do not have them, ask someone to add them for you. What else can I do to help prevent falls?  Wear shoes that:  Do not have high heels.  Have rubber bottoms.  Are comfortable and fit you well.  Are closed at the toe. Do not wear sandals.  If you use a stepladder:  Make sure that it is fully opened. Do not climb a closed stepladder.  Make sure that both sides of the stepladder are locked into place.  Ask someone to hold it for you, if possible.  Clearly mark and make sure that you can see:  Any grab bars or handrails.  First and last steps.  Where the edge of each step is.  Use tools that help you move around (mobility aids) if they are needed. These include:  Canes.  Walkers.  Scooters.  Crutches.  Turn on the lights when you go into a dark area. Replace any light bulbs as soon as they burn out.  Set up your furniture so you have a clear path. Avoid moving your furniture around.  If any of your floors are uneven, fix them.  If there are any pets around you, be aware of where they are.  Review your medicines with your doctor. Some medicines can make you feel dizzy. This can increase your chance of falling. Ask your doctor what other things that you can do to help prevent falls. This information is not intended to replace advice given to you by your health care provider. Make sure you discuss any questions you have with your health care provider. Document Released: 02/14/2009 Document Revised: 09/26/2015 Document Reviewed: 05/25/2014 Elsevier Interactive Patient Education  2017 [employer].

## 2019-06-02 NOTE — Assessment & Plan Note (Signed)
Stable.  Continue Prilosec 20 mg daily. 

## 2019-06-02 NOTE — Patient Instructions (Signed)
It was very nice to see you today!  We will refill your fluid pills today.  We will check blood work to look for any causes of your hair loss.  Please work on the exercises for your hip.  Come back in 1 year for your next checkup blood work, or sooner if needed.  Take care, Dr Jerline Pain  Please try these tips to maintain a healthy lifestyle:   Eat at least 3 REAL meals and 1-2 snacks per day.  Aim for no more than 5 hours between eating.  If you eat breakfast, please do so within one hour of getting up.    Each meal should contain half fruits/vegetables, one quarter protein, and one quarter carbs (no bigger than a computer mouse)   Cut down on sweet beverages. This includes juice, soda, and sweet tea.     Drink at least 1 glass of water with each meal and aim for at least 8 glasses per day   Exercise at least 150 minutes every week.    Preventive Care 67 Years and Older, Female Preventive care refers to lifestyle choices and visits with your health care provider that can promote health and wellness. This includes:  A yearly physical exam. This is also called an annual well check.  Regular dental and eye exams.  Immunizations.  Screening for certain conditions.  Healthy lifestyle choices, such as diet and exercise. What can I expect for my preventive care visit? Physical exam Your health care provider will check:  Height and weight. These may be used to calculate body mass index (BMI), which is a measurement that tells if you are at a healthy weight.  Heart rate and blood pressure.  Your skin for abnormal spots. Counseling Your health care provider may ask you questions about:  Alcohol, tobacco, and drug use.  Emotional well-being.  Home and relationship well-being.  Sexual activity.  Eating habits.  History of falls.  Memory and ability to understand (cognition).  Work and work Statistician.  Pregnancy and menstrual history. What immunizations do I  need?  Influenza (flu) vaccine  This is recommended every year. Tetanus, diphtheria, and pertussis (Tdap) vaccine  You may need a Td booster every 10 years. Varicella (chickenpox) vaccine  You may need this vaccine if you have not already been vaccinated. Zoster (shingles) vaccine  You may need this after age 72. Pneumococcal conjugate (PCV13) vaccine  One dose is recommended after age 49. Pneumococcal polysaccharide (PPSV23) vaccine  One dose is recommended after age 31. Measles, mumps, and rubella (MMR) vaccine  You may need at least one dose of MMR if you were born in 1957 or later. You may also need a second dose. Meningococcal conjugate (MenACWY) vaccine  You may need this if you have certain conditions. Hepatitis A vaccine  You may need this if you have certain conditions or if you travel or work in places where you may be exposed to hepatitis A. Hepatitis B vaccine  You may need this if you have certain conditions or if you travel or work in places where you may be exposed to hepatitis B. Haemophilus influenzae type b (Hib) vaccine  You may need this if you have certain conditions. You may receive vaccines as individual doses or as more than one vaccine together in one shot (combination vaccines). Talk with your health care provider about the risks and benefits of combination vaccines. What tests do I need? Blood tests  Lipid and cholesterol levels. These may be checked  every 5 years, or more frequently depending on your overall health.  Hepatitis C test.  Hepatitis B test. Screening  Lung cancer screening. You may have this screening every year starting at age 25 if you have a 30-pack-year history of smoking and currently smoke or have quit within the past 15 years.  Colorectal cancer screening. All adults should have this screening starting at age 24 and continuing until age 73. Your health care provider may recommend screening at age 44 if you are at  increased risk. You will have tests every 1-10 years, depending on your results and the type of screening test.  Diabetes screening. This is done by checking your blood sugar (glucose) after you have not eaten for a while (fasting). You may have this done every 1-3 years.  Mammogram. This may be done every 1-2 years. Talk with your health care provider about how often you should have regular mammograms.  BRCA-related cancer screening. This may be done if you have a family history of breast, ovarian, tubal, or peritoneal cancers. Other tests  Sexually transmitted disease (STD) testing.  Bone density scan. This is done to screen for osteoporosis. You may have this done starting at age 94. Follow these instructions at home: Eating and drinking  Eat a diet that includes fresh fruits and vegetables, whole grains, lean protein, and low-fat dairy products. Limit your intake of foods with high amounts of sugar, saturated fats, and salt.  Take vitamin and mineral supplements as recommended by your health care provider.  Do not drink alcohol if your health care provider tells you not to drink.  If you drink alcohol: ? Limit how much you have to 0-1 drink a day. ? Be aware of how much alcohol is in your drink. In the [place] one drink equals one 12 oz bottle of beer (355 mL), one 5 oz glass of wine (148 mL), or one 1 oz glass of hard liquor (44 mL). Lifestyle  Take daily care of your teeth and gums.  Stay active. Exercise for at least 30 minutes on 5 or more days each week.  Do not use any products that contain nicotine or tobacco, such as cigarettes, e-cigarettes, and chewing tobacco. If you need help quitting, ask your health care provider.  If you are sexually active, practice safe sex. Use a condom or other form of protection in order to prevent STIs (sexually transmitted infections).  Talk with your health care provider about taking a low-dose aspirin or statin. What's next?  Go to your  health care provider once a year for a well check visit.  Ask your health care provider how often you should have your eyes and teeth checked.  Stay up to date on all vaccines. This information is not intended to replace advice given to you by your health care provider. Make sure you discuss any questions you have with your health care provider. Document Revised: 04/14/2018 Document Reviewed: 04/14/2018 Elsevier Patient Education  2020 [employer].

## 2019-06-02 NOTE — Assessment & Plan Note (Signed)
Stable.  Continue Claritin as needed seasonally.

## 2019-06-02 NOTE — Assessment & Plan Note (Signed)
Regular rate today.  Continue management per cardiology.

## 2019-06-05 ENCOUNTER — Encounter: Payer: Self-pay | Admitting: Family Medicine

## 2019-06-05 ENCOUNTER — Other Ambulatory Visit: Payer: Self-pay

## 2019-06-05 DIAGNOSIS — E559 Vitamin D deficiency, unspecified: Secondary | ICD-10-CM | POA: Insufficient documentation

## 2019-06-05 DIAGNOSIS — E538 Deficiency of other specified B group vitamins: Secondary | ICD-10-CM | POA: Insufficient documentation

## 2019-06-05 MED ORDER — VITAMIN D (ERGOCALCIFEROL) 1.25 MG (50000 UNIT) PO CAPS: 50000.0000 [IU] | ORAL_CAPSULE | ORAL | 0 refills | Status: DC

## 2019-06-05 NOTE — Progress Notes (Signed)
Please inform patient of the following:  B12 and vitamin D levels are both low. Everything else is NORMAL. Recommend that she start on B12 protocol here. Vitamin D also low. Recommend she start 50000IU weekly.  We can recheck both of these in 6 months or so.  Brenda [name]. Jimmey Ralph, MD 06/05/2019 8:19 AM

## 2019-06-21 ENCOUNTER — Ambulatory Visit (INDEPENDENT_AMBULATORY_CARE_PROVIDER_SITE_OTHER): Payer: Medicare PPO

## 2019-06-21 ENCOUNTER — Other Ambulatory Visit: Payer: Self-pay

## 2019-06-21 DIAGNOSIS — E538 Deficiency of other specified B group vitamins: Secondary | ICD-10-CM

## 2019-06-21 MED ORDER — CYANOCOBALAMIN 1000 MCG/ML IJ SOLN
1000.0000 ug | Freq: Once | INTRAMUSCULAR | Status: AC
  Administered 2019-06-21: 1000 ug via INTRAMUSCULAR

## 2019-06-21 NOTE — Progress Notes (Signed)
Per orders of Dr. Jimmey Ralph, injection of B-12 given by Donnamarie Poag in left deltoid. Patient tolerated injection well. Patient will make appointment for 1 week for three more weeks. Will make appointment on way out today for next weekly injection.

## 2019-06-22 ENCOUNTER — Ambulatory Visit: Payer: Medicare PPO

## 2019-06-28 ENCOUNTER — Other Ambulatory Visit: Payer: Self-pay

## 2019-06-28 ENCOUNTER — Ambulatory Visit (INDEPENDENT_AMBULATORY_CARE_PROVIDER_SITE_OTHER): Payer: Medicare PPO

## 2019-06-28 DIAGNOSIS — E538 Deficiency of other specified B group vitamins: Secondary | ICD-10-CM | POA: Diagnosis not present

## 2019-06-28 MED ORDER — CYANOCOBALAMIN 1000 MCG/ML IJ SOLN
1000.0000 ug | Freq: Once | INTRAMUSCULAR | Status: AC
  Administered 2019-06-28: 13:00:00 1000 ug via INTRAMUSCULAR

## 2019-06-28 NOTE — Progress Notes (Signed)
Per orders of Dr.Parker injection of  Vitamin B12 given by Lejend Dalby. Patient tolerated injection well. 

## 2019-06-29 DIAGNOSIS — H0011 Chalazion right upper eyelid: Secondary | ICD-10-CM | POA: Diagnosis not present

## 2019-06-29 DIAGNOSIS — H16223 Keratoconjunctivitis sicca, not specified as Sjogren's, bilateral: Secondary | ICD-10-CM | POA: Diagnosis not present

## 2019-07-02 ENCOUNTER — Ambulatory Visit (INDEPENDENT_AMBULATORY_CARE_PROVIDER_SITE_OTHER): Payer: Medicare PPO | Admitting: *Deleted

## 2019-07-02 DIAGNOSIS — I480 Paroxysmal atrial fibrillation: Secondary | ICD-10-CM | POA: Diagnosis not present

## 2019-07-03 LAB — CUP PACEART REMOTE DEVICE CHECK
Date Time Interrogation Session: 20210228234438
Implantable Pulse Generator Implant Date: 20180807

## 2019-07-03 NOTE — Progress Notes (Signed)
ILR Remote 

## 2019-07-05 ENCOUNTER — Other Ambulatory Visit: Payer: Self-pay

## 2019-07-05 ENCOUNTER — Ambulatory Visit (INDEPENDENT_AMBULATORY_CARE_PROVIDER_SITE_OTHER): Payer: Medicare PPO

## 2019-07-05 DIAGNOSIS — E538 Deficiency of other specified B group vitamins: Secondary | ICD-10-CM | POA: Diagnosis not present

## 2019-07-05 MED ORDER — CYANOCOBALAMIN 1000 MCG/ML IJ SOLN
1000.0000 ug | Freq: Once | INTRAMUSCULAR | Status: AC
  Administered 2019-07-05: 10:00:00 1000 ug via INTRAMUSCULAR

## 2019-07-05 NOTE — Progress Notes (Signed)
Per orders of Dr. Jimmey Ralph, injection of B-12 given by Donnamarie Poag in left deltoid. Patient tolerated injection well. Patient will make appointment for 1 week.Has appointment made. Will continue monthly after next injection.

## 2019-07-12 ENCOUNTER — Other Ambulatory Visit: Payer: Self-pay

## 2019-07-12 ENCOUNTER — Encounter: Payer: Self-pay | Admitting: *Deleted

## 2019-07-12 ENCOUNTER — Ambulatory Visit (INDEPENDENT_AMBULATORY_CARE_PROVIDER_SITE_OTHER): Payer: Medicare PPO | Admitting: *Deleted

## 2019-07-12 DIAGNOSIS — E538 Deficiency of other specified B group vitamins: Secondary | ICD-10-CM

## 2019-07-12 MED ORDER — CYANOCOBALAMIN 1000 MCG/ML IJ SOLN
1000.0000 ug | Freq: Once | INTRAMUSCULAR | Status: AC
  Administered 2019-07-12: 1000 ug via INTRAMUSCULAR

## 2019-07-12 NOTE — Progress Notes (Signed)
Per orders of Dr. Jimmey Ralph, injection of Cyanocobalamin given by Corky Mull, LPN in right deltoid. Patient tolerated injection well. Patient will make appointment for 1 month

## 2019-07-18 ENCOUNTER — Other Ambulatory Visit: Payer: Self-pay | Admitting: Internal Medicine

## 2019-07-19 ENCOUNTER — Other Ambulatory Visit: Payer: Self-pay | Admitting: Internal Medicine

## 2019-07-24 ENCOUNTER — Other Ambulatory Visit (HOSPITAL_COMMUNITY): Payer: Self-pay | Admitting: *Deleted

## 2019-07-24 MED ORDER — METOPROLOL SUCCINATE ER 25 MG PO TB24: 12.5000 mg | ORAL_TABLET | Freq: Every day | ORAL | 2 refills | Status: DC

## 2019-08-02 ENCOUNTER — Ambulatory Visit (INDEPENDENT_AMBULATORY_CARE_PROVIDER_SITE_OTHER): Payer: Medicare PPO | Admitting: *Deleted

## 2019-08-02 DIAGNOSIS — I480 Paroxysmal atrial fibrillation: Secondary | ICD-10-CM | POA: Diagnosis not present

## 2019-08-03 LAB — CUP PACEART REMOTE DEVICE CHECK
Date Time Interrogation Session: 20210401004750
Implantable Pulse Generator Implant Date: 20180807

## 2019-08-15 ENCOUNTER — Ambulatory Visit: Payer: Medicare PPO

## 2019-08-16 ENCOUNTER — Ambulatory Visit (INDEPENDENT_AMBULATORY_CARE_PROVIDER_SITE_OTHER): Payer: Medicare PPO

## 2019-08-16 ENCOUNTER — Other Ambulatory Visit: Payer: Self-pay

## 2019-08-16 DIAGNOSIS — E538 Deficiency of other specified B group vitamins: Secondary | ICD-10-CM | POA: Diagnosis not present

## 2019-08-16 MED ORDER — CYANOCOBALAMIN 1000 MCG/ML IJ SOLN
1000.0000 ug | Freq: Once | INTRAMUSCULAR | Status: AC
  Administered 2019-08-16: 1000 ug via INTRAMUSCULAR

## 2019-08-16 NOTE — Progress Notes (Signed)
Brenda [name] is a 79 y.o. female presents to the office today for cyanocobalamin ((VITAMIN B-12)) injections, per physician's orders. Original order:06/05/2019 cyanocobalamin ((VITAMIN B-12)) injection 1,000 mcg given on Left deltoid IM   Patient next injection due:09/14/2019 appt made   Dyann Kief

## 2019-09-03 LAB — CUP PACEART REMOTE DEVICE CHECK
Date Time Interrogation Session: 20210502005118
Implantable Pulse Generator Implant Date: 20180807

## 2019-09-04 ENCOUNTER — Ambulatory Visit (INDEPENDENT_AMBULATORY_CARE_PROVIDER_SITE_OTHER): Payer: Medicare PPO | Admitting: *Deleted

## 2019-09-04 ENCOUNTER — Telehealth: Payer: Self-pay | Admitting: Family Medicine

## 2019-09-04 DIAGNOSIS — I480 Paroxysmal atrial fibrillation: Secondary | ICD-10-CM | POA: Diagnosis not present

## 2019-09-04 NOTE — Telephone Encounter (Signed)
Patient has completed her VIT D script.  Would like to know if another script needs to go to the pharmacy or if she needs to start OTC.    Patient would also like to confirm when her next B 12 is and how often she is to get these injections.   Would also like to confirm how long she is to take both.

## 2019-09-04 NOTE — Telephone Encounter (Signed)
Pt advice to continue OTC Vit D therapy. Need two more round of B12 inj. May 14 and June 14.  Pt verbalized understanding.

## 2019-09-05 NOTE — Progress Notes (Signed)
Carelink Summary Report / Loop Recorder 

## 2019-09-12 ENCOUNTER — Encounter: Payer: Self-pay | Admitting: Internal Medicine

## 2019-09-14 ENCOUNTER — Ambulatory Visit (INDEPENDENT_AMBULATORY_CARE_PROVIDER_SITE_OTHER): Payer: Medicare PPO | Admitting: *Deleted

## 2019-09-14 ENCOUNTER — Other Ambulatory Visit: Payer: Self-pay

## 2019-09-14 DIAGNOSIS — E538 Deficiency of other specified B group vitamins: Secondary | ICD-10-CM | POA: Diagnosis not present

## 2019-09-14 MED ORDER — CYANOCOBALAMIN 1000 MCG/ML IJ SOLN
1000.0000 ug | Freq: Once | INTRAMUSCULAR | Status: AC
  Administered 2019-09-14: 1000 ug via INTRAMUSCULAR

## 2019-09-14 NOTE — Progress Notes (Signed)
Brenda [name] is a 79 y.o. female presents to the office today for cyanocobalamin ((VITAMIN B-12)) injection 1,000 mcg given IM left deltoid  per physician's orders. Original order: 06/05/2019  Patient tolerated injection.  Patient next injection due: 10/15/2019, appt made   Dyann Kief

## 2019-09-18 ENCOUNTER — Telehealth: Payer: Self-pay | Admitting: Family Medicine

## 2019-09-18 ENCOUNTER — Other Ambulatory Visit: Payer: Self-pay | Admitting: *Deleted

## 2019-09-18 DIAGNOSIS — K219 Gastro-esophageal reflux disease without esophagitis: Secondary | ICD-10-CM

## 2019-09-18 NOTE — Telephone Encounter (Signed)
Ok with me. Please place any necessary orders. 

## 2019-09-18 NOTE — Telephone Encounter (Signed)
Please advise 

## 2019-09-18 NOTE — Telephone Encounter (Signed)
Referral placed.

## 2019-09-18 NOTE — Telephone Encounter (Signed)
Patient calling needs a referral for Dr Stan Head. She has spoken with Dr Jimmey Ralph in reference to a [facility]astro [facility]. But needs a referral for ins. Please advise. jk

## 2019-10-05 ENCOUNTER — Telehealth: Payer: Self-pay | Admitting: Family Medicine

## 2019-10-05 NOTE — Telephone Encounter (Signed)
Patient called to let Dr. Jimmey Ralph know that she received the pfizer vac on  05/19/19 and 2nd 06/09/19.

## 2019-10-05 NOTE — Telephone Encounter (Signed)
Chart UTD 

## 2019-10-06 LAB — CUP PACEART REMOTE DEVICE CHECK
Date Time Interrogation Session: 20210603230739
Implantable Pulse Generator Implant Date: 20180807

## 2019-10-12 ENCOUNTER — Other Ambulatory Visit: Payer: Self-pay

## 2019-10-12 ENCOUNTER — Ambulatory Visit (INDEPENDENT_AMBULATORY_CARE_PROVIDER_SITE_OTHER): Payer: Medicare PPO

## 2019-10-12 DIAGNOSIS — E538 Deficiency of other specified B group vitamins: Secondary | ICD-10-CM | POA: Diagnosis not present

## 2019-10-12 MED ORDER — CYANOCOBALAMIN 1000 MCG/ML IJ SOLN
1000.0000 ug | Freq: Once | INTRAMUSCULAR | Status: AC
  Administered 2019-10-12: 1000 ug via INTRAMUSCULAR

## 2019-10-12 NOTE — Progress Notes (Signed)
Per orders of Dr.Hunter injection of Vitamin B12 given by Gaige Fussner. Patient tolerated injection well.  

## 2019-10-23 ENCOUNTER — Other Ambulatory Visit: Payer: Self-pay | Admitting: Family Medicine

## 2019-10-27 ENCOUNTER — Other Ambulatory Visit: Payer: Self-pay

## 2019-10-27 ENCOUNTER — Other Ambulatory Visit (INDEPENDENT_AMBULATORY_CARE_PROVIDER_SITE_OTHER): Payer: Medicare PPO

## 2019-10-27 DIAGNOSIS — E538 Deficiency of other specified B group vitamins: Secondary | ICD-10-CM

## 2019-10-27 LAB — B12 AND FOLATE PANEL
Folate: >24.8 ng/mL (ref >5.9)
Vitamin B-12: 649 pg/mL (ref 211–911)

## 2019-10-27 NOTE — Addendum Note (Signed)
Addended by: Laddie Aquas A on: 10/27/2019 09:02 AM   Modules accepted: Orders

## 2019-10-30 ENCOUNTER — Ambulatory Visit: Payer: Medicare PPO | Admitting: Internal Medicine

## 2019-10-30 ENCOUNTER — Encounter: Payer: Self-pay | Admitting: Internal Medicine

## 2019-10-30 VITALS — BP 108/62 | HR 68 | Ht 64.0 in | Wt 203.4 lb

## 2019-10-30 DIAGNOSIS — R198 Other specified symptoms and signs involving the digestive system and abdomen: Secondary | ICD-10-CM | POA: Diagnosis not present

## 2019-10-30 DIAGNOSIS — R1031 Right lower quadrant pain: Secondary | ICD-10-CM | POA: Diagnosis not present

## 2019-10-30 DIAGNOSIS — K6289 Other specified diseases of anus and rectum: Secondary | ICD-10-CM | POA: Diagnosis not present

## 2019-10-30 DIAGNOSIS — R195 Other fecal abnormalities: Secondary | ICD-10-CM | POA: Diagnosis not present

## 2019-10-30 NOTE — Patient Instructions (Signed)
Normal BMI (Body Mass Index- based on height and weight) is between 23 and 30. Your BMI today is Body mass index is 34.91 kg/m. Marland Kitchen Please consider follow up  regarding your BMI with your Primary Care Provider.   You have been scheduled for a colonoscopy. Please follow written instructions given to you at your visit today.  Please pick up your prep supplies at the pharmacy within the next 1-3 days. If you use inhalers (even only as needed), please bring them with you on the day of your procedure.   I appreciate the opportunity to care for you. Stan Head, MD, [facility]

## 2019-10-30 NOTE — Progress Notes (Signed)
Please inform patient of the following:  B12 level back to normal. Ok for her to stop injections and start supplementing with daily.

## 2019-10-30 NOTE — Progress Notes (Signed)
Brenda [name]. Brenda [name] 80 y.o. 1940/08/02 [id number]  Assessment & Plan:   Encounter Diagnoses  Name Primary?  . Change in stool caliber Yes  . Abnormal defecation   . RLQ abdominal pain   . Decreased anal sphincter tone     Evaluate with colonoscopy.  Rule out colonic neoplasia, she could have an anastomotic stricture.  Question if she has some pelvic floor dysfunction suspect so as her defecation exam with functional rectal exam today showed some abnormalities.  Consider pelvic floor physical therapy depending upon colonoscopy results.  The risks and benefits as well as alternatives of endoscopic procedure(s) have been discussed and reviewed. All questions answered. The patient agrees to proceed.  Subjective:   Chief Complaint: Right lower quadrant pain change in bowel habits  HPI The patient is a 79 year old white woman who moved here from [place] where she used to get her GI care, who was complaining of persistent decrease in stool caliber and some right lower quadrant pain.  The right lower quadrant pain is something she has had off and on ever since she had resection of sigmoid colon for diverticulitis a number of years ago.  She has a report of a CT scan from [place] from 2011 that shows an anastomosis and mild sigmoid diverticulosis but no other abnormalities that was done for this problem.  The change in stool caliber is new and is persisted for 6 months.  She has difficulty evacuating as well.  She does not have difficulty with urination.  No rectal bleeding.  Some bloating and gas.  Previous colonoscopies in 2005 and 2015 per patient report.  No history of colon polyps.  Reflux is controlled on omeprazole.  GI review of systems is otherwise negative.  Lab Results  Component Value Date   WBC 6.5 06/02/2019   HGB 13.5 06/02/2019   HCT 41.6 06/02/2019   MCV 93.6 06/02/2019   PLT 199.0 06/02/2019   I have reviewed recent primary care notes.  No Known Allergies Current Meds    Medication Sig  . aspirin 81 MG tablet Take 81 mg by mouth daily.  Marland Kitchen atorvastatin (LIPITOR) 40 MG tablet TAKE 1 TABLET(40 MG) BY MOUTH DAILY  . cholecalciferol (VITAMIN D3) 25 MCG (1000 UNIT) tablet Take 1,000 Units by mouth daily.  . furosemide (LASIX) 20 MG tablet Take 1 tablet (20 mg total) by mouth daily as needed for fluid.  Marland Kitchen loratadine (CLARITIN) 10 MG tablet Take 10 mg by mouth daily as needed for allergies.   . metoprolol succinate (TOPROL-XL) 25 MG 24 hr tablet Take 0.5 tablets (12.5 mg total) by mouth daily.  . minoxidil (ROGAINE) 2 % external solution Apply topically 2 (two) times daily.  . Multiple Vitamins-Minerals (HAIR SKIN & NAILS ADVANCED PO) Take 1 tablet by mouth in the morning, at noon, and at bedtime.  Marland Kitchen omeprazole (PRILOSEC) 20 MG capsule TAKE 1 CAPSULE(20 MG) BY MOUTH DAILY  . Specialty Vitamins Products (BIOTIN PLUS KERATIN) 10000-100 MCG-MG TABS Take by mouth.   Past Medical History:  Diagnosis Date  . Atrial fibrillation ([place])   . Diverticulosis   . GERD (gastroesophageal reflux disease)   . Hypertension   . OSA (obstructive sleep apnea)    severe  . Paroxysmal A-fib [facility])    s/p afib ablation at [facility]   Past Surgical History:  Procedure Laterality Date  . [place]  . CATARACT EXTRACTION, BILATERAL  2020  . CHOLECYSTECTOMY  1977  .  COLON RESECTION  2005   due to diverticulitis  . COLONOSCOPY     Multiple  . LOOP RECORDER INSERTION N/A 12/08/2016   Procedure: LOOP RECORDER INSERTION;  Surgeon: Hillis Range, MD;  Location: [facility] INVASIVE CV LAB;  Service: Cardiovascular;  Laterality: N/A;  . TOTAL ABDOMINAL HYSTERECTOMY  1985   Social History   Social History Narrative   2018 moved to [place] from [place]   Retired Tourist information centre manager   3 daughters daughter in [place] is a physical therapist   2 cups of coffee a day no tobacco now though former smoker no drug use   family history includes Parkinson's  disease in her father; Stroke in her mother.   Review of Systems Decreased vision otherwise negative.  See HPI also.  Objective:   Physical Exam BP 108/62   Pulse 68 Comment: irregularly irregular  Ht 5\' 4"  (1.626 m)   Wt 203 lb 6.4 oz (92.3 kg)   BMI 34.91 kg/m  NAD obese ww Eyes anicteric Lungs cta RRR S1S2 no rmg abd obese mild RLQ tenderness, no mass BS +, low midline scar, no hernia  Patti , CMA present.  Anoderm inspection revealed no abnormalities Anal wink was + Digital exam revealed decreased resting tone and voluntary squeeze. No mass or rectocele present. Simulated defecation with valsalva revealed appropriate abdominal contraction and reduced relaxation and descent.

## 2019-11-09 ENCOUNTER — Ambulatory Visit (INDEPENDENT_AMBULATORY_CARE_PROVIDER_SITE_OTHER): Payer: Medicare PPO | Admitting: *Deleted

## 2019-11-09 DIAGNOSIS — I480 Paroxysmal atrial fibrillation: Secondary | ICD-10-CM

## 2019-11-09 LAB — CUP PACEART REMOTE DEVICE CHECK
Date Time Interrogation Session: 20210707232620
Implantable Pulse Generator Implant Date: 20180807

## 2019-11-12 LAB — CUP PACEART REMOTE DEVICE CHECK
Date Time Interrogation Session: 20210711000500
Implantable Pulse Generator Implant Date: 20180807

## 2019-11-13 NOTE — Progress Notes (Signed)
Carelink Summary Report / Loop Recorder 

## 2019-12-04 ENCOUNTER — Encounter: Payer: Self-pay | Admitting: Internal Medicine

## 2019-12-11 ENCOUNTER — Encounter: Payer: Self-pay | Admitting: Internal Medicine

## 2019-12-11 ENCOUNTER — Ambulatory Visit (AMBULATORY_SURGERY_CENTER): Payer: Medicare PPO | Admitting: Internal Medicine

## 2019-12-11 ENCOUNTER — Other Ambulatory Visit: Payer: Self-pay

## 2019-12-11 VITALS — BP 102/70 | HR 61 | Temp 97.1°F | Resp 24 | Ht 64.0 in | Wt 203.0 lb

## 2019-12-11 DIAGNOSIS — R194 Change in bowel habit: Secondary | ICD-10-CM | POA: Diagnosis not present

## 2019-12-11 DIAGNOSIS — R1032 Left lower quadrant pain: Secondary | ICD-10-CM | POA: Diagnosis not present

## 2019-12-11 DIAGNOSIS — R195 Other fecal abnormalities: Secondary | ICD-10-CM

## 2019-12-11 DIAGNOSIS — K5730 Diverticulosis of large intestine without perforation or abscess without bleeding: Secondary | ICD-10-CM | POA: Diagnosis not present

## 2019-12-11 DIAGNOSIS — G4733 Obstructive sleep apnea (adult) (pediatric): Secondary | ICD-10-CM | POA: Diagnosis not present

## 2019-12-11 DIAGNOSIS — I10 Essential (primary) hypertension: Secondary | ICD-10-CM | POA: Diagnosis not present

## 2019-12-11 LAB — CUP PACEART REMOTE DEVICE CHECK
Date Time Interrogation Session: 20210809232650
Implantable Pulse Generator Implant Date: 20180807

## 2019-12-11 MED ORDER — SODIUM CHLORIDE 0.9 % IV SOLN: 500.0000 mL | Freq: Once | INTRAVENOUS | Status: DC

## 2019-12-11 NOTE — Patient Instructions (Addendum)
There is some scattered diverticulosis seen - the surgery site looks fine.  I think trying pelvic floor PT makes sense and will refer you for that to see if that helps.  I think it should.  I appreciate the opportunity to care for you. Iva Boop, MD, [facility]   Handout provided on diverticulosis.   YOU HAD AN ENDOSCOPIC PROCEDURE TODAY AT THE [facility]:   Refer to the procedure report that was given to you for any specific questions about what was found during the examination.  If the procedure report does not answer your questions, please call your gastroenterologist to clarify.  If you requested that your care partner not be given the details of your procedure findings, then the procedure report has been included in a sealed envelope for you to review at your convenience later.  YOU SHOULD EXPECT: Some feelings of bloating in the abdomen. Passage of more gas than usual.  Walking can help get rid of the air that was put into your GI tract during the procedure and reduce the bloating. If you had a lower endoscopy (such as a colonoscopy or flexible sigmoidoscopy) you may notice spotting of blood in your stool or on the toilet paper. If you underwent a bowel prep for your procedure, you may not have a normal bowel movement for a few days.  Please Note:  You might notice some irritation and congestion in your nose or some drainage.  This is from the oxygen used during your procedure.  There is no need for concern and it should clear up in a day or so.  SYMPTOMS TO REPORT IMMEDIATELY:   Following lower endoscopy (colonoscopy or flexible sigmoidoscopy):  Excessive amounts of blood in the stool  Significant tenderness or worsening of abdominal pains  Swelling of the abdomen that is new, acute  Fever of 100F or higher  For urgent or emergent issues, a gastroenterologist can be reached at any hour by calling [phone]. Do not use MyChart messaging for urgent concerns.     DIET:  We do recommend a small meal at first, but then you may proceed to your regular diet.  Drink plenty of fluids but you should avoid alcoholic beverages for 24 hours.  ACTIVITY:  You should plan to take it easy for the rest of today and you should NOT DRIVE or use heavy machinery until tomorrow (because of the sedation medicines used during the test).    FOLLOW UP: Our staff will call the number listed on your records 48-72 hours following your procedure to check on you and address any questions or concerns that you may have regarding the information given to you following your procedure. If we do not reach you, we will leave a message.  We will attempt to reach you two times.  During this call, we will ask if you have developed any symptoms of COVID 19. If you develop any symptoms (ie: fever, flu-like symptoms, shortness of breath, cough etc.) before then, please call [phone].  If you test positive for Covid 19 in the 2 weeks post procedure, please call and report this information to [place].    If any biopsies were taken you will be contacted by phone or by letter within the next 1-3 weeks.  Please call us at [phone] if you have not heard about the biopsies in 3 weeks.    SIGNATURES/CONFIDENTIALITY: You and/or your care partner have signed paperwork which will be entered into your electronic medical  record.  These signatures attest to the fact that that the information above on your After Visit Summary has been reviewed and is understood.  Full responsibility of the confidentiality of this discharge information lies with you and/or your care-partner.

## 2019-12-11 NOTE — Progress Notes (Signed)
Report to PACU, RN, vss, BBS= Clear.  

## 2019-12-11 NOTE — Op Note (Signed)
[facility] Patient Name: Brenda [name] Procedure Date: 12/11/2019 8:31 AM MRN: [id number] Endoscopist: Iva Boop , MD Age: 79 Referring MD:  Date of Birth: [birth year] Gender: Female Account #: [id number] Procedure:                Colonoscopy Indications:              Change in bowel habits Medicines:                Propofol per Anesthesia, Monitored Anesthesia Care Procedure:                Pre-Anesthesia Assessment:                           - Prior to the procedure, a History and Physical                            was performed, and patient medications and                            allergies were reviewed. The patient's tolerance of                            previous anesthesia was also reviewed. The risks                            and benefits of the procedure and the sedation                            options and risks were discussed with the patient.                            All questions were answered, and informed consent                            was obtained. Prior Anticoagulants: The patient has                            taken no previous anticoagulant or antiplatelet                            agents. ASA Grade Assessment: II - A patient with                            mild systemic disease. After reviewing the risks                            and benefits, the patient was deemed in                            satisfactory condition to undergo the procedure.                           After obtaining informed consent, the colonoscope  was passed under direct vision. Throughout the                            procedure, the patient's blood pressure, pulse, and                            oxygen saturations were monitored continuously. The                            Colonoscope was introduced through the anus and                            advanced to the the cecum, identified by                            appendiceal orifice and  ileocecal valve. The                            colonoscopy was performed without difficulty. The                            patient tolerated the procedure well. The quality                            of the bowel preparation was good. The bowel                            preparation used was Miralax via split dose                            instruction. The ileocecal valve, appendiceal                            orifice, and rectum were photographed. Scope In: 8:43:12 AM Scope Out: 8:55:24 AM Scope Withdrawal Time: 0 hours 10 minutes 4 seconds  Total Procedure Duration: 0 hours 12 minutes 12 seconds  Findings:                 The perianal examination was normal.                           The digital rectal exam findings include decreased                            sphincter tone.                           Scattered diverticula were found in the entire                            colon.                           There was evidence of a prior end-to-end  colo-colonic anastomosis in the sigmoid colon. This                            was patent and was characterized by healthy                            appearing mucosa. The anastomosis was traversed.                           The exam was otherwise without abnormality on                            direct and retroflexion views. Complications:            No immediate complications. Estimated Blood Loss:     Estimated blood loss: none. Impression:               - Decreased sphincter tone found on digital rectal                            exam.                           - Diverticulosis in the entire examined colon.                           - Patent end-to-end colo-colonic anastomosis,                            characterized by healthy appearing mucosa.                           - The examination was otherwise normal on direct                            and retroflexion views.                           - No  specimens collected. Recommendation:           - Patient has a contact number available for                            emergencies. The signs and symptoms of potential                            delayed complications were discussed with the                            patient. Return to normal activities tomorrow.                            Written discharge instructions were provided to the                            patient.                           -  Resume previous diet.                           - Continue present medications.                           - No repeat colonoscopy due to age and the absence                            of colonic polyps.                           - WILL REFER TO PELVIC PT AT [facility] TO                            TREAT CONSTIPATION PROBLEMS Iva Booparl E Deandre Stansel, MD 12/11/2019 9:06:53 AM This report has been signed electronically.

## 2019-12-11 NOTE — Progress Notes (Signed)
Pt's states no medical or surgical changes since previsit or office visit.  Vitals- Courtney 

## 2019-12-12 ENCOUNTER — Other Ambulatory Visit: Payer: Self-pay | Admitting: Family Medicine

## 2019-12-12 ENCOUNTER — Ambulatory Visit (INDEPENDENT_AMBULATORY_CARE_PROVIDER_SITE_OTHER): Payer: Medicare PPO | Admitting: *Deleted

## 2019-12-12 ENCOUNTER — Telehealth: Payer: Self-pay

## 2019-12-12 DIAGNOSIS — I480 Paroxysmal atrial fibrillation: Secondary | ICD-10-CM | POA: Diagnosis not present

## 2019-12-12 DIAGNOSIS — Z1231 Encounter for screening mammogram for malignant neoplasm of breast: Secondary | ICD-10-CM

## 2019-12-12 NOTE — Telephone Encounter (Signed)
Per colonoscopy report - Dr. Leone Payor wanted to refer patient for pelvic pt at [facility] for constipation issues  Scheduled patient at [facility] for pelvic PT on 12/26/19 at 2 pm with Clydie Braun, patient is aware of her appt and advised to call us if she has any other concerns.  Faxed records to [facility].

## 2019-12-13 ENCOUNTER — Telehealth: Payer: Self-pay

## 2019-12-13 NOTE — Progress Notes (Signed)
Carelink Summary Report / Loop Recorder 

## 2019-12-13 NOTE — Telephone Encounter (Signed)
  Follow up Call-  Call back number 12/11/2019  Post procedure Call Back phone  # [phone]  Permission to leave phone message Yes  Some recent data might be hidden     Patient questions:  Do you have a fever, pain , or abdominal swelling? No. Pain Score  0 *  Have you tolerated food without any problems? Yes.    Have you been able to return to your normal activities? Yes.    Do you have any questions about your discharge instructions: Diet   No. Medications  No. Follow up visit  No.  Do you have questions or concerns about your Care? No.  Actions: * If pain score is 4 or above: No action needed, pain <4.  1. Have you developed a fever since your procedure? no  2.   Have you had an respiratory symptoms (SOB or cough) since your procedure? no  3.   Have you tested positive for COVID 19 since your procedure no  4.   Have you had any family members/close contacts diagnosed with the COVID 19 since your procedure?  no   If yes to any of these questions please route to Laverna Peace, RN and Karlton Lemon, RN

## 2019-12-14 ENCOUNTER — Telehealth: Payer: Self-pay | Admitting: Internal Medicine

## 2019-12-14 NOTE — Telephone Encounter (Signed)
All notes were originally faxed on 12/12/19. Refaxed today.

## 2019-12-21 ENCOUNTER — Telehealth: Payer: Self-pay | Admitting: *Deleted

## 2019-12-21 NOTE — Telephone Encounter (Signed)
Spoke with pt to request manual LINQ transmission to review untransmitted episodes. Repeated 3230 error code. Pt agrees to call [employer] tech support today for assistance with troubleshooting monitor. Will plan to call back later today for update. Pt in agreement with plan.

## 2019-12-21 NOTE — Telephone Encounter (Signed)
Spoke with pt. New Carelink monitor ordered today, should arrive in 7 business days. Pt agrees to call back when monitor received so that we can assist with sending a manual transmission and review findings. Direct DC number provided.

## 2019-12-27 NOTE — Telephone Encounter (Signed)
Manual transmission reviewed. "AF" episodes false, ECGs show SR/ST w/PACs.

## 2019-12-27 NOTE — Telephone Encounter (Signed)
Transmission received 12/27/2019

## 2020-01-02 ENCOUNTER — Other Ambulatory Visit: Payer: Self-pay

## 2020-01-02 ENCOUNTER — Other Ambulatory Visit: Payer: Self-pay | Admitting: Family Medicine

## 2020-01-02 ENCOUNTER — Ambulatory Visit
Admission: RE | Admit: 2020-01-02 | Discharge: 2020-01-02 | Disposition: A | Discharge status: Home / Self Care | Payer: Medicare PPO | Source: Ambulatory Visit | Attending: Family Medicine | Admitting: Family Medicine

## 2020-01-02 DIAGNOSIS — Z1231 Encounter for screening mammogram for malignant neoplasm of breast: Secondary | ICD-10-CM

## 2020-01-03 DIAGNOSIS — S92902A Unspecified fracture of left foot, initial encounter for closed fracture: Secondary | ICD-10-CM

## 2020-01-03 HISTORY — DX: Unspecified fracture of left foot, initial encounter for closed fracture: S92.902A

## 2020-01-14 ENCOUNTER — Ambulatory Visit: Payer: Medicare PPO

## 2020-01-15 ENCOUNTER — Telehealth: Payer: Self-pay | Admitting: *Deleted

## 2020-01-15 LAB — CUP PACEART REMOTE DEVICE CHECK
Date Time Interrogation Session: 20210911233115
Implantable Pulse Generator Implant Date: 20180807

## 2020-01-15 NOTE — Telephone Encounter (Signed)
Unscheduled LINQ manual transmission received 01/14/20 at 13:13.  Presenting rhythm irregular.  Reviewed with Dr. Lonie Peak exclude AF.  Of note, all recent recorded "AF" episodes appear SR w/ ectopy.  Per Dr. Johney Frame, if pt symptomatic, plan to arrange nurse visit for EKG tomorrow.  Discussed with pt.  She states that she just didn't feel right yesterday, so she sent a transmission.  Felt like she was not getting enough oxygen.  Pulse ox needs new batteries so she could not check O2 sats.  Reports she feels better today.  Pt is agreeable to a nurse visit for EKG tomorrow.  She is available all day.  Will forward to Inetta Fermo, Charity fundraiser, for assistance arranging appointment.  She also has questions about the results of her CT angio performed in 04/2019.  Encouraged pt to call the DC when sending manual transmissions in the future.  Pt verbalizes understanding and appreciation of call.

## 2020-01-16 ENCOUNTER — Ambulatory Visit (INDEPENDENT_AMBULATORY_CARE_PROVIDER_SITE_OTHER): Payer: Medicare PPO | Admitting: Emergency Medicine

## 2020-01-16 ENCOUNTER — Encounter: Payer: Self-pay | Admitting: *Deleted

## 2020-01-16 ENCOUNTER — Ambulatory Visit (INDEPENDENT_AMBULATORY_CARE_PROVIDER_SITE_OTHER): Payer: Medicare PPO | Admitting: *Deleted

## 2020-01-16 ENCOUNTER — Other Ambulatory Visit: Payer: Self-pay

## 2020-01-16 VITALS — HR 79 | Ht 64.0 in | Wt 203.6 lb

## 2020-01-16 DIAGNOSIS — R002 Palpitations: Secondary | ICD-10-CM

## 2020-01-16 DIAGNOSIS — I480 Paroxysmal atrial fibrillation: Secondary | ICD-10-CM

## 2020-01-16 LAB — CUP PACEART INCLINIC DEVICE CHECK
Date Time Interrogation Session: 20210914151714
Implantable Pulse Generator Implant Date: 20180807

## 2020-01-16 NOTE — Progress Notes (Signed)
Loop check in clinic due to alert transmission requiring manual download.  Battery status: Good. R-waves  0.64mV. 0 symptom episodes, 0 tachy episodes, 0 pause episodes, 0 brady episodes. 112 AF episodes (<1% burden)- Episodes do not appear to be true AF.  Patient has history of the same.   Monthly summary reports, next scheduled 02/16/20.  ROV with Dr. Johney Frame if indicated.

## 2020-01-16 NOTE — Patient Instructions (Addendum)
Medication Instructions:  Your physician recommends that you continue on your current medications as directed. Please refer to the Current Medication list given to you today.  *If you need a refill on your cardiac medications before your next appointment, please call your pharmacy*  Lab Work: None ordered.  If you have labs (blood work) drawn today and your tests are completely normal, you will receive your results only by: Marland Kitchen MyChart Message (if you have MyChart) OR . A paper copy in the mail If you have any lab test that is abnormal or we need to change your treatment, we will call you to review the results.  Testing/Procedures: ILR interrogation   Follow-Up: At [facility], you and your health needs are our priority.  As part of our continuing mission to provide you with exceptional heart care, we have created designated Provider Care Teams.  These Care Teams include your primary Cardiologist (physician) and Advanced Practice Providers (APPs -  Physician Assistants and Nurse Practitioners) who all work together to provide you with the care you need, when you need it.  We recommend signing up for the patient portal called "MyChart".  Sign up information is provided on this After Visit Summary.  MyChart is used to connect with patients for Virtual Visits (Telemedicine).  Patients are able to view lab/test results, encounter notes, upcoming appointments, etc.  Non-urgent messages can be sent to your provider as well.   To learn more about what you can do with MyChart, go to [email or web address].    Your next appointment:   Your physician wants you to follow-up in: Rudi Coco in Dec 2021 You will receive a reminder letter in the mail two months in advance. If you don't receive a letter, please call our office to schedule the follow-up appointment.   Other Instructions:

## 2020-01-16 NOTE — Telephone Encounter (Signed)
Called patient and advised to come in for a nurse visit today with EKG at 2 pm.   Patient verbalized understanding.

## 2020-01-16 NOTE — Progress Notes (Signed)
1.) Reason for visit:  EKG to exclude Afib.  2.) Name of MD requesting visit: Dr. Johney Frame  3.) H&P: Manual transmission from ILR reviewed by Dr. Johney Frame can not exclude Afib pre MD if patient symptomatic plan for nurse visit.   4.) ROS related to problem: She just doesn't feel right when manual transmission was sent in a once today as well. Feels like she is not getting enough oxygen at times. Helped patient get her home O2 monitor working.   5.) Assessment and plan per MD: Continue current medication. Amy with [facility] did ILR interrogation. Having EKG scanned in today for Dr. Johney Frame to review in epic. Will contact patient if changes need to be made.

## 2020-01-22 ENCOUNTER — Telehealth: Payer: Self-pay | Admitting: *Deleted

## 2020-01-22 NOTE — Telephone Encounter (Signed)
Spoke with pt to request manual LINQ transmission for review of any untransmitted "AF" episodes.  Pt reports she had some symptoms yesterday where she did not feel like she was "getting enough oxygen."  She was unable to check her O2 sat as she replaced the batteries and cannot close the compartment.  Similar to previous symptoms, EKG obtained 01/16/20 during nurse visit and sent to Dr. Johney Frame for review.  Manual transmission received.  All available "AF" ECGs appear SR w/ PACs.  No true AF episodes noted.  Advised pt will discuss possible reprogramming with Dr. Johney Frame on Wednesday, 01/25/20.  Will plan to call pt later this week with an update. Pt in agreement with plan and denies additional questions at this time.

## 2020-01-24 NOTE — Telephone Encounter (Signed)
Dr. Johney Frame reviewed EKG from 01/16/20, advised that EKG exhibits sinus rhythm with trigeminal PACs.  Per Dr. Johney Frame, plan to offer appointment on 01/31/20 at 08:30.  May consider reprogramming AF detection to "less sensitive" and ectopy rejection to "aggressive."  Spoke with pt.  She accepted this appointment.  Denies additional questions at this time.

## 2020-01-30 DIAGNOSIS — M25571 Pain in right ankle and joints of right foot: Secondary | ICD-10-CM | POA: Diagnosis not present

## 2020-01-31 ENCOUNTER — Ambulatory Visit (INDEPENDENT_AMBULATORY_CARE_PROVIDER_SITE_OTHER): Payer: Medicare PPO | Admitting: Internal Medicine

## 2020-01-31 ENCOUNTER — Other Ambulatory Visit: Payer: Self-pay

## 2020-01-31 ENCOUNTER — Encounter: Payer: Self-pay | Admitting: Internal Medicine

## 2020-01-31 VITALS — BP 134/80 | HR 86 | Ht 64.0 in | Wt 198.0 lb

## 2020-01-31 DIAGNOSIS — I712 Thoracic aortic aneurysm, without rupture: Secondary | ICD-10-CM

## 2020-01-31 DIAGNOSIS — G4733 Obstructive sleep apnea (adult) (pediatric): Secondary | ICD-10-CM

## 2020-01-31 DIAGNOSIS — I7121 Aneurysm of the ascending aorta, without rupture: Secondary | ICD-10-CM

## 2020-01-31 DIAGNOSIS — I10 Essential (primary) hypertension: Secondary | ICD-10-CM

## 2020-01-31 DIAGNOSIS — I480 Paroxysmal atrial fibrillation: Secondary | ICD-10-CM

## 2020-01-31 DIAGNOSIS — R0602 Shortness of breath: Secondary | ICD-10-CM

## 2020-01-31 LAB — CUP PACEART INCLINIC DEVICE CHECK
Date Time Interrogation Session: 20210929094841
Implantable Pulse Generator Implant Date: 20180807

## 2020-01-31 MED ORDER — METOPROLOL SUCCINATE ER 25 MG PO TB24: 25.0000 mg | ORAL_TABLET | Freq: Every day | ORAL | 3 refills | Status: DC

## 2020-01-31 NOTE — Progress Notes (Signed)
PCP: Ardith Dark, MD   Primary EP: Dr Johney Frame  Brenda [name] is a 79 y.o. female who presents today for routine electrophysiology followup.  She has frequent palpitations.  ILR reveals that these are primarily due to PACs.  Since last being seen in our clinic, the patient reports doing very well.  Today, she denies symptoms of palpitations, chest pain, shortness of breath,  lower extremity edema, dizziness, presyncope, or syncope.  The patient is otherwise without complaint today.   Past Medical History:  Diagnosis Date  . Atrial fibrillation (HCC)   . Diverticulosis   . GERD (gastroesophageal reflux disease)   . Hypertension   . OSA (obstructive sleep apnea)    severe  . Paroxysmal A-fib [facility])    s/p afib ablation at [facility]   Past Surgical History:  Procedure Laterality Date  . ATRIAL FIBRILLATION ABLATION     [employer]  . CATARACT EXTRACTION, BILATERAL  2020  . CHOLECYSTECTOMY  1977  . COLON RESECTION  2005   due to diverticulitis  . COLONOSCOPY     Multiple  . LOOP RECORDER INSERTION N/A 12/08/2016   Procedure: LOOP RECORDER INSERTION;  Surgeon: Hillis Range, MD;  Location: [facility] INVASIVE CV LAB;  Service: Cardiovascular;  Laterality: N/A;  . TOTAL ABDOMINAL HYSTERECTOMY  1985    ROS- all systems are reviewed and negatives except as per HPI above  Current Outpatient Medications  Medication Sig Dispense Refill  . aspirin 81 MG tablet Take 81 mg by mouth daily.    Marland Kitchen atorvastatin (LIPITOR) 40 MG tablet TAKE 1 TABLET(40 MG) BY MOUTH DAILY 90 tablet 3  . cholecalciferol (VITAMIN D3) 25 MCG (1000 UNIT) tablet Take 1,000 Units by mouth daily.    . furosemide (LASIX) 20 MG tablet Take 1 tablet (20 mg total) by mouth daily as needed for fluid. 30 tablet 5  . loratadine (CLARITIN) 10 MG tablet Take 10 mg by mouth daily as needed for allergies.     . metoprolol succinate (TOPROL-XL) 25 MG 24 hr tablet Take 0.5 tablets (12.5 mg total) by mouth daily. 45 tablet 2  .  minoxidil (ROGAINE) 2 % external solution Apply topically 2 (two) times daily.    . Multiple Vitamins-Minerals (HAIR SKIN & NAILS ADVANCED PO) Take 1 tablet by mouth in the morning, at noon, and at bedtime.    Marland Kitchen omeprazole (PRILOSEC) 20 MG capsule TAKE 1 CAPSULE(20 MG) BY MOUTH DAILY 90 capsule 3  . Specialty Vitamins Products (BIOTIN PLUS KERATIN) 10000-100 MCG-MG TABS Take by mouth.     No current facility-administered medications for this visit.    Physical Exam: Vitals:   01/31/20 0906  BP: 134/80  Pulse: 86  SpO2: 95%  Weight: 198 lb (89.8 kg)  Height: 5\' 4"  (1.626 m)    GEN- The patient is well appearing, alert and oriented x 3 today.   Head- normocephalic, atraumatic Eyes-  Sclera clear, conjunctiva pink Ears- hearing intact Oropharynx- clear Lungs-  normal work of breathing Heart- Regular rate and rhythm with ectopy GI- soft,  Extremities- R leg is in a boot today  Wt Readings from Last 3 Encounters:  01/31/20 198 lb (89.8 kg)  01/16/20 203 lb 9.6 oz (92.4 kg)  12/11/19 203 lb (92.1 kg)    EKG tracing ordered today is personally reviewed and shows sinus with frequent PACs  Assessment and Plan:  1. Palpitations/ PACs/ AF Primarily due to PACs by ILR afib is well controlled chads2vasc score is 4.  She is not on anticoagulation due to low AF burden.  She may be more willing to consider OAC if her afib increases. ILR optimized for alert detections today Increase toprol to 25mg  daily echo  2. HTN Stable No change required today echo  3. OSA Uses CPAP, follows with Dr  4. Ascending aneurysm (47mm) Repeat chest CTA in December Echo to evaluate aortic root  Risks, benefits and potential toxicities for medications prescribed and/or refilled reviewed with patient today.   Follow-up annually with AF clinic I am happy to see when needed  January MD, [facility] 01/31/2020 9:12 AM

## 2020-01-31 NOTE — Patient Instructions (Addendum)
Medication Instructions:  1 increase your metoprolol succinate to 25 mg   Take one tablet by mouth daily.   *If you need a refill on your cardiac medications before your next appointment, please call your pharmacy*  Lab Work: None ordered.  If you have labs (blood work) drawn today and your tests are completely normal, you will receive your results only by:  MyChart Message (if you have MyChart) OR  A paper copy in the mail If you have any lab test that is abnormal or we need to change your treatment, we will call you to review the results.  Testing/Procedures: Please schedule ECHO and Chest CT Your physician has requested that you have an echocardiogram. Echocardiography is a painless test that uses sound waves to create images of your heart. It provides your doctor with information about the size and shape of your heart and how well your hearts chambers and valves are working. This procedure takes approximately one hour. There are no restrictions for this procedure. Non-Cardiac CT Angiography (CTA), is a special type of CT scan that uses a computer to produce multi-dimensional views of major blood vessels throughout the body. In CT angiography, a contrast material is injected through an IV to help visualize the blood vessels  Follow-Up: At [facility], you and your health needs are our priority.  As part of our continuing mission to provide you with exceptional heart care, we have created designated Provider Care Teams.  These Care Teams include your primary Cardiologist (physician) and Advanced Practice Providers (APPs -  Physician Assistants and Nurse Practitioners) who all work together to provide you with the care you need, when you need it.  We recommend signing up for the patient portal called "MyChart".  Sign up information is provided on this After Visit Summary.  MyChart is used to connect with patients for Virtual Visits (Telemedicine).  Patients are able to view lab/test  results, encounter notes, upcoming appointments, etc.  Non-urgent messages can be sent to your provider as well.   To learn more about what you can do with MyChart, go to [email or web address].    Your next appointment:   Your physician wants you to follow-up in: Afib clinic in Dec. They will call you to schedule.     Other Instructions:

## 2020-01-31 NOTE — Addendum Note (Signed)
Addended by: Sampson Goon on: 01/31/2020 09:46 AM   Modules accepted: Orders

## 2020-02-05 DIAGNOSIS — M25571 Pain in right ankle and joints of right foot: Secondary | ICD-10-CM | POA: Diagnosis not present

## 2020-02-16 ENCOUNTER — Ambulatory Visit (INDEPENDENT_AMBULATORY_CARE_PROVIDER_SITE_OTHER): Payer: Medicare PPO

## 2020-02-16 DIAGNOSIS — I480 Paroxysmal atrial fibrillation: Secondary | ICD-10-CM

## 2020-02-17 LAB — CUP PACEART REMOTE DEVICE CHECK
Date Time Interrogation Session: 20211014233735
Implantable Pulse Generator Implant Date: 20180807

## 2020-02-19 DIAGNOSIS — M25571 Pain in right ankle and joints of right foot: Secondary | ICD-10-CM | POA: Diagnosis not present

## 2020-02-21 NOTE — Progress Notes (Signed)
Carelink Summary Report / Loop Recorder 

## 2020-03-04 ENCOUNTER — Other Ambulatory Visit (HOSPITAL_COMMUNITY): Payer: Medicare PPO

## 2020-03-04 ENCOUNTER — Other Ambulatory Visit: Payer: Medicare PPO

## 2020-03-05 DIAGNOSIS — G4733 Obstructive sleep apnea (adult) (pediatric): Secondary | ICD-10-CM | POA: Diagnosis not present

## 2020-03-06 ENCOUNTER — Other Ambulatory Visit: Payer: Medicare PPO | Admitting: *Deleted

## 2020-03-06 ENCOUNTER — Ambulatory Visit (HOSPITAL_COMMUNITY): Payer: Medicare PPO | Attending: Internal Medicine

## 2020-03-06 ENCOUNTER — Other Ambulatory Visit: Payer: Self-pay

## 2020-03-06 DIAGNOSIS — R0602 Shortness of breath: Secondary | ICD-10-CM

## 2020-03-06 DIAGNOSIS — I480 Paroxysmal atrial fibrillation: Secondary | ICD-10-CM

## 2020-03-06 DIAGNOSIS — G4733 Obstructive sleep apnea (adult) (pediatric): Secondary | ICD-10-CM | POA: Diagnosis not present

## 2020-03-06 DIAGNOSIS — I7121 Aneurysm of the ascending aorta, without rupture: Secondary | ICD-10-CM

## 2020-03-06 DIAGNOSIS — I712 Thoracic aortic aneurysm, without rupture: Secondary | ICD-10-CM | POA: Diagnosis not present

## 2020-03-06 DIAGNOSIS — I10 Essential (primary) hypertension: Secondary | ICD-10-CM

## 2020-03-06 LAB — BASIC METABOLIC PANEL
BUN/Creatinine Ratio: 13 (ref 12–28)
BUN: 10 mg/dL (ref 8–27)
CO2: 31 mmol/L — ABNORMAL HIGH (ref 20–29)
Calcium: 9.3 mg/dL (ref 8.7–10.3)
Chloride: 101 mmol/L (ref 96–106)
Creatinine, Ser: 0.79 mg/dL (ref 0.57–1.00)
GFR calc Af Amer: 84 mL/min/{1.73_m2} (ref >59)
GFR calc non Af Amer: 72 mL/min/{1.73_m2} (ref >59)
Glucose: 115 mg/dL — ABNORMAL HIGH (ref 65–99)
Potassium: 3.8 mmol/L (ref 3.5–5.2)
Sodium: 140 mmol/L (ref 134–144)

## 2020-03-06 LAB — ECHOCARDIOGRAM COMPLETE
Area-P 1/2: 2.99 cm2
S' Lateral: 2.6 cm

## 2020-03-08 ENCOUNTER — Ambulatory Visit (INDEPENDENT_AMBULATORY_CARE_PROVIDER_SITE_OTHER)
Admission: RE | Admit: 2020-03-08 | Discharge: 2020-03-08 | Disposition: A | Discharge status: Home / Self Care | Payer: Medicare PPO | Source: Ambulatory Visit | Attending: Internal Medicine | Admitting: Internal Medicine

## 2020-03-08 ENCOUNTER — Inpatient Hospital Stay: Admission: RE | Admit: 2020-03-08 | Payer: Medicare PPO | Source: Ambulatory Visit

## 2020-03-08 ENCOUNTER — Other Ambulatory Visit: Payer: Self-pay

## 2020-03-08 DIAGNOSIS — I7121 Aneurysm of the ascending aorta, without rupture: Secondary | ICD-10-CM

## 2020-03-08 DIAGNOSIS — R0602 Shortness of breath: Secondary | ICD-10-CM

## 2020-03-08 DIAGNOSIS — I712 Thoracic aortic aneurysm, without rupture: Secondary | ICD-10-CM | POA: Diagnosis not present

## 2020-03-08 DIAGNOSIS — K449 Diaphragmatic hernia without obstruction or gangrene: Secondary | ICD-10-CM | POA: Diagnosis not present

## 2020-03-08 DIAGNOSIS — I517 Cardiomegaly: Secondary | ICD-10-CM | POA: Diagnosis not present

## 2020-03-08 DIAGNOSIS — I480 Paroxysmal atrial fibrillation: Secondary | ICD-10-CM | POA: Diagnosis not present

## 2020-03-08 DIAGNOSIS — I10 Essential (primary) hypertension: Secondary | ICD-10-CM

## 2020-03-08 DIAGNOSIS — G4733 Obstructive sleep apnea (adult) (pediatric): Secondary | ICD-10-CM

## 2020-03-08 DIAGNOSIS — J9811 Atelectasis: Secondary | ICD-10-CM | POA: Diagnosis not present

## 2020-03-08 MED ORDER — IOHEXOL 350 MG/ML SOLN
75.0000 mL | Freq: Once | INTRAVENOUS | Status: AC | PRN
  Administered 2020-03-08: 75 mL via INTRAVENOUS

## 2020-03-11 DIAGNOSIS — M25571 Pain in right ankle and joints of right foot: Secondary | ICD-10-CM | POA: Diagnosis not present

## 2020-03-20 ENCOUNTER — Ambulatory Visit (INDEPENDENT_AMBULATORY_CARE_PROVIDER_SITE_OTHER): Payer: Medicare PPO

## 2020-03-20 DIAGNOSIS — I480 Paroxysmal atrial fibrillation: Secondary | ICD-10-CM

## 2020-03-20 LAB — CUP PACEART REMOTE DEVICE CHECK
Date Time Interrogation Session: 20211116225126
Implantable Pulse Generator Implant Date: 20180807

## 2020-03-21 NOTE — Progress Notes (Signed)
Carelink Summary Report / Loop Recorder 

## 2020-04-17 ENCOUNTER — Telehealth (HOSPITAL_COMMUNITY): Payer: Self-pay | Admitting: Nurse Practitioner

## 2020-04-17 NOTE — Telephone Encounter (Signed)
Patient had appt scheduled 05/01/20 with Rudi Coco, NP.  Patient called wanting to cancel this appt.  I offered to reschedule and patient wanted to wait another 3 months.  I advised patient we need to see her a little sooner and offered late January to early February.  Pt stated she has a trip planned during that time and is unsure of those dates.  Patient stated once she knows the dates of her trip she will call us back to reschedule her appt here.

## 2020-04-22 ENCOUNTER — Ambulatory Visit (INDEPENDENT_AMBULATORY_CARE_PROVIDER_SITE_OTHER): Payer: Medicare PPO

## 2020-04-22 DIAGNOSIS — I480 Paroxysmal atrial fibrillation: Secondary | ICD-10-CM

## 2020-04-22 LAB — CUP PACEART REMOTE DEVICE CHECK
Date Time Interrogation Session: 20211219225117
Implantable Pulse Generator Implant Date: 20180807

## 2020-05-01 ENCOUNTER — Ambulatory Visit (HOSPITAL_COMMUNITY): Payer: Medicare PPO | Admitting: Nurse Practitioner

## 2020-05-06 NOTE — Progress Notes (Signed)
Carelink Summary Report / Loop Recorder 

## 2020-05-07 ENCOUNTER — Telehealth: Payer: Self-pay | Admitting: Emergency Medicine

## 2020-05-07 NOTE — Telephone Encounter (Signed)
Patient notified that LINQ has reached RRT. Patient does not wish to have the LINQ removed at this time and will contact the office if she wants to schedule an explant at a later date. Instructed to unplug home monitor and she will expect a return kit in the mail for the monitor.

## 2020-05-20 ENCOUNTER — Other Ambulatory Visit: Payer: Self-pay | Admitting: Family Medicine

## 2020-05-29 ENCOUNTER — Ambulatory Visit (HOSPITAL_COMMUNITY)
Admission: RE | Admit: 2020-05-29 | Discharge: 2020-05-29 | Disposition: A | Discharge status: Home / Self Care | Payer: Medicare PPO | Source: Ambulatory Visit | Attending: Nurse Practitioner | Admitting: Nurse Practitioner

## 2020-05-29 ENCOUNTER — Encounter (HOSPITAL_COMMUNITY): Payer: Self-pay | Admitting: Nurse Practitioner

## 2020-05-29 ENCOUNTER — Other Ambulatory Visit: Payer: Self-pay

## 2020-05-29 VITALS — BP 136/86 | HR 112 | Ht 64.0 in | Wt 213.4 lb

## 2020-05-29 DIAGNOSIS — I480 Paroxysmal atrial fibrillation: Secondary | ICD-10-CM

## 2020-05-29 DIAGNOSIS — Z79899 Other long term (current) drug therapy: Secondary | ICD-10-CM | POA: Insufficient documentation

## 2020-05-29 DIAGNOSIS — Z95818 Presence of other cardiac implants and grafts: Secondary | ICD-10-CM | POA: Insufficient documentation

## 2020-05-29 DIAGNOSIS — Z791 Long term (current) use of non-steroidal anti-inflammatories (NSAID): Secondary | ICD-10-CM | POA: Diagnosis not present

## 2020-05-29 DIAGNOSIS — Z9989 Dependence on other enabling machines and devices: Secondary | ICD-10-CM | POA: Diagnosis not present

## 2020-05-29 DIAGNOSIS — Z87891 Personal history of nicotine dependence: Secondary | ICD-10-CM | POA: Insufficient documentation

## 2020-05-29 NOTE — Progress Notes (Signed)
Primary Care Physician: Ardith Dark, MD Referring Physician: Dr. Rubbie Battiest Brenda [name] is a 80 y.o. female with a h/o paroxysmal afib with a Linq for surveillance that is here for f/u.  She reports that she is doing well.She did have 2.5 hours of afib in May 2020 that she was unaware of. Her afib burden is low at 0.1 %. She has a CHA2DS2VASc score of 4 and is not on anticoagulation. She has deferred in the past unless her burden significantly increases by Linq.  In for f/u 05/30/19. She feels well,no issues with afib. Last linq in December showing no arrhythmia, as well as for the last 6 months.   F/u in afib clinic, 05/29/20, she was seen by Dr. Johney Frame last September and device showed a lot of PAC's and afib burden remained very low. Pt is still deferring DOAC unless her afib burden significantly increases. Her Toprol was increased to 25 mg daily by Dr. Johney Frame. Using cpap. EKG today shows SR. Her Linq is now with dead battery. The issue is that she is not on DOAC and the Linq was helping to mitigate her risk to watch for afib not on DOAC. She is on Celebrex daily but would prefer not to take DOAC if possible so that she can continue to take her NSAIDs. She is interested to replace Linq.   Today, she denies symptoms of palpitations, chest pain, shortness of breath, orthopnea, PND, lower extremity edema, dizziness, presyncope, syncope, or neurologic sequela. The patient is tolerating medications without difficulties and is otherwise without complaint today.   Past Medical History:  Diagnosis Date  . Atrial fibrillation (HCC)   . Diverticulosis   . GERD (gastroesophageal reflux disease)   . Hypertension   . OSA (obstructive sleep apnea)    severe  . Paroxysmal A-fib [facility]    s/p afib ablation at [facility]   Past Surgical History:  Procedure Laterality Date  . ATRIAL FIBRILLATION ABLATION     [employer]  . CATARACT EXTRACTION, BILATERAL  2020  . CHOLECYSTECTOMY  1977  . COLON  RESECTION  2005   due to diverticulitis  . COLONOSCOPY     Multiple  . LOOP RECORDER INSERTION N/A 12/08/2016   Procedure: LOOP RECORDER INSERTION;  Surgeon: Hillis Range, MD;  Location: [facility] INVASIVE CV LAB;  Service: Cardiovascular;  Laterality: N/A;  . TOTAL ABDOMINAL HYSTERECTOMY  1985    Current Outpatient Medications  Medication Sig Dispense Refill  . aspirin 81 MG tablet Take 81 mg by mouth daily.    Marland Kitchen atorvastatin (LIPITOR) 40 MG tablet TAKE 1 TABLET(40 MG) BY MOUTH DAILY 90 tablet 3  . cholecalciferol (VITAMIN D3) 25 MCG (1000 UNIT) tablet Take 1,000 Units by mouth daily.    . furosemide (LASIX) 20 MG tablet TAKE 1 TABLET(20 MG) BY MOUTH DAILY AS NEEDED FOR FLUID RETENTION 30 tablet 0  . loratadine (CLARITIN) 10 MG tablet Take 10 mg by mouth daily as needed for allergies.     . metoprolol succinate (TOPROL XL) 25 MG 24 hr tablet Take 1 tablet (25 mg total) by mouth daily. 90 tablet 3  . minoxidil (ROGAINE) 2 % external solution Apply topically 2 (two) times daily.    . Multiple Vitamins-Minerals (HAIR SKIN & NAILS ADVANCED PO) Take 1 tablet by mouth in the morning, at noon, and at bedtime.    Marland Kitchen omeprazole (PRILOSEC) 20 MG capsule TAKE 1 CAPSULE(20 MG) BY MOUTH DAILY 90 capsule 3  . Specialty Vitamins  Products (BIOTIN PLUS KERATIN) 10000-100 MCG-MG TABS Take by mouth.     No current facility-administered medications for this encounter.    No Known Allergies  Social History   Socioeconomic History  . Marital status: Divorced    Spouse name: Not on file  . Number of children: Not on file  . Years of education: Not on file  . Highest education level: Not on file  Occupational History  . Not on file  Tobacco Use  . Smoking status: Former Smoker    Packs/day: 0.50    Years: 48.00    Pack years: 24.00    Types: Cigarettes    Start date: 62    Quit date: 07/03/2006    Years since quitting: 13.9  . Smokeless tobacco: Never Used  Vaping Use  . Vaping Use: Never used   Substance and Sexual Activity  . Alcohol use: Yes    Alcohol/week: 1.0 standard drink    Types: 1 Glasses of wine per week    Comment: occasion  . Drug use: No  . Sexual activity: Never  Other Topics Concern  . Not on file  Social History Narrative   2018 moved to [place] from [place]   Retired Tourist information centre manager   3 daughters daughter in [place] is a physical therapist   2 cups of coffee a day no tobacco now though former smoker no drug use   Social Determinants of Corporate investment banker Strain: Not on file  Food Insecurity: Not on file  Transportation Needs: Not on file  Physical Activity: Not on file  Stress: Not on file  Social Connections: Not on file  Intimate Partner Violence: Not on file    Family History  Problem Relation Age of Onset  . Stroke Mother   . Parkinson's disease Father   . Colon cancer Neg Hx   . Esophageal cancer Neg Hx   . Pancreatic cancer Neg Hx   . Stomach cancer Neg Hx   . Liver disease Neg Hx     ROS- All systems are reviewed and negative except as per the HPI above  Physical Exam: There were no vitals filed for this visit. Wt Readings from Last 3 Encounters:  01/31/20 89.8 kg  01/16/20 92.4 kg  12/11/19 92.1 kg    Labs: Lab Results  Component Value Date   NA 140 03/06/2020   K 3.8 03/06/2020   CL 101 03/06/2020   CO2 31 (H) 03/06/2020   GLUCOSE 115 (H) 03/06/2020   BUN 10 03/06/2020   CREATININE 0.79 03/06/2020   CALCIUM 9.3 03/06/2020   No results found for: INR Lab Results  Component Value Date   CHOL 148 06/02/2019   HDL 65.40 06/02/2019   LDLCALC 67 06/02/2019   TRIG 77.0 06/02/2019     GEN- The patient is well appearing, alert and oriented x 3 today.   Head- normocephalic, atraumatic Eyes-  Sclera clear, conjunctiva pink Ears- hearing intact Oropharynx- clear Neck- supple, no JVP Lymph- no cervical lymphadenopathy Lungs- Clear to ausculation bilaterally, normal work of breathing Heart-  Regular rate and rhythm, no murmurs, rubs or gallops, PMI not laterally displaced GI- soft, NT, ND, + BS Extremities- no clubbing, cyanosis, or edema MS- no significant deformity or atrophy Skin- no rash or lesion Psych- euthymic mood, full affect Neuro- strength and sensation are intact  EKG-NSR  linq report reviewed from  last September    Assessment and Plan: 1. Paroxysmal afib Burden very low thru Linq,  Zettie Cooley now has reached RRT as of early January Continue BB without change  2. CHA2DS2VASc score of 4 afib burden is 0.1% Pt wishes to have her Link reinserted to help moniotr need for anticoagulation   Will refer back to Dr. Johney Frame  to address this F/u here in one year  Lupita Leash C. Matthew Folks [facility] [address] [place], [place] [address] [phone]

## 2020-06-16 ENCOUNTER — Other Ambulatory Visit: Payer: Self-pay | Admitting: Family Medicine

## 2020-06-17 ENCOUNTER — Other Ambulatory Visit: Payer: Self-pay | Admitting: Family Medicine

## 2020-06-21 ENCOUNTER — Encounter: Payer: Self-pay | Admitting: Internal Medicine

## 2020-06-21 ENCOUNTER — Other Ambulatory Visit: Payer: Self-pay

## 2020-06-21 ENCOUNTER — Ambulatory Visit: Payer: Medicare PPO | Admitting: Internal Medicine

## 2020-06-21 VITALS — BP 122/78 | HR 63 | Ht 64.0 in | Wt 218.4 lb

## 2020-06-21 DIAGNOSIS — I712 Thoracic aortic aneurysm, without rupture: Secondary | ICD-10-CM | POA: Diagnosis not present

## 2020-06-21 DIAGNOSIS — I480 Paroxysmal atrial fibrillation: Secondary | ICD-10-CM | POA: Diagnosis not present

## 2020-06-21 DIAGNOSIS — I7121 Aneurysm of the ascending aorta, without rupture: Secondary | ICD-10-CM

## 2020-06-21 DIAGNOSIS — I10 Essential (primary) hypertension: Secondary | ICD-10-CM

## 2020-06-21 HISTORY — PX: OTHER SURGICAL HISTORY: SHX169

## 2020-06-21 NOTE — Progress Notes (Signed)
PCP: Ardith Dark, MD   Primary EP: Dr Johney Frame  Brenda [name] is a 80 y.o. female who presents today for routine electrophysiology followup.  Since last being seen in our clinic, the patient reports doing very well.   Today, she denies symptoms of palpitations, chest pain, shortness of breath,  lower extremity edema, dizziness, presyncope, or syncope.  The patient is otherwise without complaint today.   Past Medical History:  Diagnosis Date  . Atrial fibrillation (HCC)   . Diverticulosis   . GERD (gastroesophageal reflux disease)   . Hypertension   . OSA (obstructive sleep apnea)    severe  . Paroxysmal A-fib [facility])    s/p afib ablation at [facility]   Past Surgical History:  Procedure Laterality Date  . ATRIAL FIBRILLATION ABLATION     [employer]  . CATARACT EXTRACTION, BILATERAL  2020  . CHOLECYSTECTOMY  1977  . COLON RESECTION  2005   due to diverticulitis  . COLONOSCOPY     Multiple  . LOOP RECORDER INSERTION N/A 12/08/2016   Procedure: LOOP RECORDER INSERTION;  Surgeon: Hillis Range, MD;  Location: [facility] INVASIVE CV LAB;  Service: Cardiovascular;  Laterality: N/A;  . TOTAL ABDOMINAL HYSTERECTOMY  1985    ROS- all systems are reviewed and negatives except as per HPI above  Current Outpatient Medications  Medication Sig Dispense Refill  . aspirin 81 MG tablet Take 81 mg by mouth daily.    Marland Kitchen atorvastatin (LIPITOR) 40 MG tablet TAKE 1 TABLET(40 MG) BY MOUTH DAILY 90 tablet 3  . CELEBREX 100 MG capsule Take 100 mg by mouth 2 (two) times daily.    . cholecalciferol (VITAMIN D3) 25 MCG (1000 UNIT) tablet Take 1,000 Units by mouth daily.    . furosemide (LASIX) 20 MG tablet TAKE 1 TABLET(20 MG) BY MOUTH DAILY AS NEEDED FOR FLUID RETENTION 90 tablet 3  . loratadine (CLARITIN) 10 MG tablet Take 10 mg by mouth daily as needed for allergies.     . metoprolol succinate (TOPROL XL) 25 MG 24 hr tablet Take 1 tablet (25 mg total) by mouth daily. 90 tablet 3  . minoxidil (ROGAINE)  2 % external solution Apply topically 2 (two) times daily.    Marland Kitchen omeprazole (PRILOSEC) 20 MG capsule TAKE 1 CAPSULE(20 MG) BY MOUTH DAILY 90 capsule 3  . vitamin B-12 (CYANOCOBALAMIN) 1000 MCG tablet Take 1,000 mcg by mouth daily.     No current facility-administered medications for this visit.    Physical Exam: Vitals:   06/21/20 1114  BP: 122/78  Pulse: 63  SpO2: 96%  Weight: 218 lb 6.4 oz (99.1 kg)  Height: 5\' 4"  (1.626 m)    GEN- The patient is well appearing, alert and oriented x 3 today.   Head- normocephalic, atraumatic Eyes-  Sclera clear, conjunctiva pink Ears- hearing intact Oropharynx- clear Lungs- Clear to ausculation bilaterally, normal work of breathing Heart- Regular rate and rhythm, no murmurs, rubs or gallops, PMI not laterally displaced GI- soft, NT, ND, + BS Extremities- no clubbing, cyanosis, or edema  Wt Readings from Last 3 Encounters:  06/21/20 218 lb 6.4 oz (99.1 kg)  05/29/20 213 lb 6.4 oz (96.8 kg)  01/31/20 198 lb (89.8 kg)    EKG tracing ordered today is personally reviewed and shows sinus  Assessment and Plan:  1. Paroxysmal atrial fibrillation/ PACs/ AF Well controlled chads2vasc score is 4.  We have stopped OAC and followed with ILR Her ILR is at EOS.  She would  like to have a new device implanted.  This has been very helpful in management of anticoagulation. I would therefore advise explantation of the old device with  implantation of a new implantable loop recorder for long term arrhythmia monitoring.  Risks and benefits to ILR were discussed at length with the patient today, including but not limited to risks of bleeding and infection.  Extensive device education was performed.  Remote monitoring was also discussed at length today.  The patient understands and wishes to proceed.  We will proceed at this time with ILR implantation.  2. HTN Stable No change required today  3. OSA Uses CPAP  4. Ascending aneurysm (42mm) CT 03/08/20  reviewed Repeat study in a year   Hillis Range MD, [facility] 06/21/2020 11:38 AM   syncope     PROCEDURES:   1. Explantation of a previously placed implantable loop recorder 2. Implantable loop recorder explantation       DESCRIPTION OF PROCEDURE:  Informed written consent was obtained.  The patient required no sedation for the procedure today.   The patients left chest was therefore prepped and draped in the usual sterile fashion.  The skin overlying the ILR monitor was infiltrated with lidocaine for local analgesia.  A 0.5-cm incision was made over the site.  The previously implanted ILR was exposed and removed using a combination of sharp and blunt dissection.    A Medtronic Reveal Linq model LNQ 22 ([id number]) implantable loop recorder was then placed into the pocket R waves were very prominent and measured > 0.2 mV. EBL<1 ml.  Steri- Strips and a sterile dressing were then applied.  There were no early apparent complications.     CONCLUSIONS:   1. Successful explantation of a previously implated Medtronic LINQ 1 with subsequent implantation of a Medtronic Reveal [id number] implantable loop recorder for afib management and evaluation of palpitations  2. No early apparent complications.   Hillis Range MD, [facility] 06/21/2020 11:38 AM

## 2020-06-21 NOTE — Patient Instructions (Addendum)
Medication Instructions:  Your physician recommends that you continue on your current medications as directed. Please refer to the Current Medication list given to you today.  Labwork: None ordered.  Testing/Procedures: None ordered.  Your physician wants you to follow-up in: 6 months with Francis Dowse PA.  You will receive a reminder letter in the mail two months in advance. If you don't receive a letter, please call our office to schedule the follow-up appointment.    Implantable Loop Recorder Removal & Placement, Care After This sheet gives you information about how to care for yourself after your procedure. Your health care provider may also give you more specific instructions. If you have problems or questions, contact your health care provider. What can I expect after the procedure? After the procedure, it is common to have:  Soreness or discomfort near the incision.  Some swelling or bruising near the incision.  Follow these instructions at home: Incision care  1.  Leave your outer dressing on for 24 hours.  After 24 hours you can remove your outer dressing and shower. 2. Leave adhesive strips in place. These skin closures may need to stay in place for 1-2 weeks. If adhesive strip edges start to loosen and curl up, you may trim the loose edges.  You may remove the strips if they have not fallen off after 2 weeks. 3. Check your incision area every day for signs of infection. Check for: a. Redness, swelling, or pain. b. Fluid or blood. c. Warmth. d. Pus or a bad smell. 4. Do not take baths, swim, or use a hot tub until your incision is completely healed. 5. If your wound site starts to bleed apply pressure.      If you have any questions/concerns please call the device clinic at [phone].  Activity  Return to your normal activities.  General instructions  Follow instructions from your health care provider about how to manage your implantable loop recorder and  transmit the information. Learn how to activate a recording if this is necessary for your type of device.  Do not go through a metal detection gate, and do not let someone hold a metal detector over your chest. Show your ID card.  Do not have an MRI unless you check with your health care provider first.  Take over-the-counter and prescription medicines only as told by your health care provider.  Keep all follow-up visits as told by your health care provider. This is important. Contact a health care provider if:  You have redness, swelling, or pain around your incision.  You have a fever.  You have pain that is not relieved by your pain medicine.  You have triggered your device because of fainting (syncope) or because of a heartbeat that feels like it is racing, slow, fluttering, or skipping (palpitations). Get help right away if you have:  Chest pain.  Difficulty breathing. Summary  After the procedure, it is common to have soreness or discomfort near the incision.  Change your dressing as told by your health care provider.  Follow instructions from your health care provider about how to manage your implantable loop recorder and transmit the information.  Keep all follow-up visits as told by your health care provider. This is important. This information is not intended to replace advice given to you by your health care provider. Make sure you discuss any questions you have with your health care provider. Document Released: 04/01/2015 Document Revised: 06/05/2017 Document Reviewed: 06/05/2017 Elsevier Patient Education  2020 [employer]

## 2020-07-05 ENCOUNTER — Ambulatory Visit (INDEPENDENT_AMBULATORY_CARE_PROVIDER_SITE_OTHER): Payer: Medicare PPO

## 2020-07-05 ENCOUNTER — Other Ambulatory Visit: Payer: Self-pay

## 2020-07-05 DIAGNOSIS — Z0000 Encounter for general adult medical examination without abnormal findings: Secondary | ICD-10-CM

## 2020-07-05 NOTE — Patient Instructions (Addendum)
[name] , Thank you for taking time to come for your Medicare Wellness Visit. I appreciate your ongoing commitment to your health goals. Please review the following plan we discussed and let me know if I can assist you in the future.   Screening recommendations/referrals: Colonoscopy: Done 12/11/19 Mammogram: Done 01/02/20 Bone Density: Declined at this time Recommended yearly ophthalmology/optometry visit for glaucoma screening and checkup Recommended yearly dental visit for hygiene and checkup  Vaccinations: Influenza vaccine: Done 03/29/20 Pneumococcal vaccine: Done 02/06/19 PCV13 Tdap vaccine: Up to date Shingles vaccine: Shingrix discussed. Please contact your pharmacy for coverage information.    Covid-19:Completed 1/15, 2/5, & 03/29/20  Advanced directives: Please bring a copy of your health care power of attorney and living will to the office at your convenience.  Conditions/risks identified: Lose weight  Next appointment: Follow up in one year for your annual wellness visit    Preventive Care 65 Years and Older, Female Preventive care refers to lifestyle choices and visits with your health care provider that can promote health and wellness. What does preventive care include?  A yearly physical exam. This is also called an annual well check.  Dental exams once or twice a year.  Routine eye exams. Ask your health care provider how often you should have your eyes checked.  Personal lifestyle choices, including:  Daily care of your teeth and gums.  Regular physical activity.  Eating a healthy diet.  Avoiding tobacco and drug use.  Limiting alcohol use.  Practicing safe sex.  Taking low-dose aspirin every day.  Taking vitamin and mineral supplements as recommended by your health care provider. What happens during an annual well check? The services and screenings done by your health care provider during your annual well check will depend on your age, overall  health, lifestyle risk factors, and family history of disease. Counseling  Your health care provider may ask you questions about your:  Alcohol use.  Tobacco use.  Drug use.  Emotional well-being.  Home and relationship well-being.  Sexual activity.  Eating habits.  History of falls.  Memory and ability to understand (cognition).  Work and work Astronomer.  Reproductive health. Screening  You may have the following tests or measurements:  Height, weight, and BMI.  Blood pressure.  Lipid and cholesterol levels. These may be checked every 5 years, or more frequently if you are over 43 years old.  Skin check.  Lung cancer screening. You may have this screening every year starting at age 59 if you have a 30-pack-year history of smoking and currently smoke or have quit within the past 15 years.  Fecal occult blood test (FOBT) of the stool. You may have this test every year starting at age 63.  Flexible sigmoidoscopy or colonoscopy. You may have a sigmoidoscopy every 5 years or a colonoscopy every 10 years starting at age 79.  Hepatitis C blood test.  Hepatitis B blood test.  Sexually transmitted disease (STD) testing.  Diabetes screening. This is done by checking your blood sugar (glucose) after you have not eaten for a while (fasting). You may have this done every 1-3 years.  Bone density scan. This is done to screen for osteoporosis. You may have this done starting at age 66.  Mammogram. This may be done every 1-2 years. Talk to your health care provider about how often you should have regular mammograms. Talk with your health care provider about your test results, treatment options, and if necessary, the need for more tests. Vaccines  Your health care provider may recommend certain vaccines, such as:  Influenza vaccine. This is recommended every year.  Tetanus, diphtheria, and acellular pertussis (Tdap, Td) vaccine. You may need a Td booster every 10  years.  Zoster vaccine. You may need this after age 65.  Pneumococcal 13-valent conjugate (PCV13) vaccine. One dose is recommended after age 14.  Pneumococcal polysaccharide (PPSV23) vaccine. One dose is recommended after age 45. Talk to your health care provider about which screenings and vaccines you need and how often you need them. This information is not intended to replace advice given to you by your health care provider. Make sure you discuss any questions you have with your health care provider. Document Released: 05/17/2015 Document Revised: 01/08/2016 Document Reviewed: 02/19/2015 Elsevier Interactive Patient Education  2017 [place] Prevention in the Home Falls can cause injuries. They can happen to people of all ages. There are many things you can do to make your home safe and to help prevent falls. What can I do on the outside of my home?  Regularly fix the edges of walkways and driveways and fix any cracks.  Remove anything that might make you trip as you walk through a door, such as a raised step or threshold.  Trim any bushes or trees on the path to your home.  Use bright outdoor lighting.  Clear any walking paths of anything that might make someone trip, such as rocks or tools.  Regularly check to see if handrails are loose or broken. Make sure that both sides of any steps have handrails.  Any raised decks and porches should have guardrails on the edges.  Have any leaves, snow, or ice cleared regularly.  Use sand or salt on walking paths during winter.  Clean up any spills in your garage right away. This includes oil or grease spills. What can I do in the bathroom?  Use night lights.  Install grab bars by the toilet and in the tub and shower. Do not use towel bars as grab bars.  Use non-skid mats or decals in the tub or shower.  If you need to sit down in the shower, use a plastic, non-slip stool.  Keep the floor dry. Clean up any water that  spills on the floor as soon as it happens.  Remove soap buildup in the tub or shower regularly.  Attach bath mats securely with double-sided non-slip rug tape.  Do not have throw rugs and other things on the floor that can make you trip. What can I do in the bedroom?  Use night lights.  Make sure that you have a light by your bed that is easy to reach.  Do not use any sheets or blankets that are too big for your bed. They should not hang down onto the floor.  Have a firm chair that has side arms. You can use this for support while you get dressed.  Do not have throw rugs and other things on the floor that can make you trip. What can I do in the kitchen?  Clean up any spills right away.  Avoid walking on wet floors.  Keep items that you use a lot in easy-to-reach places.  If you need to reach something above you, use a strong step stool that has a grab bar.  Keep electrical cords out of the way.  Do not use floor polish or wax that makes floors slippery. If you must use wax, use non-skid floor wax.  Do  not have throw rugs and other things on the floor that can make you trip. What can I do with my stairs?  Do not leave any items on the stairs.  Make sure that there are handrails on both sides of the stairs and use them. Fix handrails that are broken or loose. Make sure that handrails are as long as the stairways.  Check any carpeting to make sure that it is firmly attached to the stairs. Fix any carpet that is loose or worn.  Avoid having throw rugs at the top or bottom of the stairs. If you do have throw rugs, attach them to the floor with carpet tape.  Make sure that you have a light switch at the top of the stairs and the bottom of the stairs. If you do not have them, ask someone to add them for you. What else can I do to help prevent falls?  Wear shoes that:  Do not have high heels.  Have rubber bottoms.  Are comfortable and fit you well.  Are closed at the  toe. Do not wear sandals.  If you use a stepladder:  Make sure that it is fully opened. Do not climb a closed stepladder.  Make sure that both sides of the stepladder are locked into place.  Ask someone to hold it for you, if possible.  Clearly mark and make sure that you can see:  Any grab bars or handrails.  First and last steps.  Where the edge of each step is.  Use tools that help you move around (mobility aids) if they are needed. These include:  Canes.  Walkers.  Scooters.  Crutches.  Turn on the lights when you go into a dark area. Replace any light bulbs as soon as they burn out.  Set up your furniture so you have a clear path. Avoid moving your furniture around.  If any of your floors are uneven, fix them.  If there are any pets around you, be aware of where they are.  Review your medicines with your doctor. Some medicines can make you feel dizzy. This can increase your chance of falling. Ask your doctor what other things that you can do to help prevent falls. This information is not intended to replace advice given to you by your health care provider. Make sure you discuss any questions you have with your health care provider. Document Released: 02/14/2009 Document Revised: 09/26/2015 Document Reviewed: 05/25/2014 Elsevier Interactive Patient Education  2017 [employer].

## 2020-07-05 NOTE — Progress Notes (Signed)
Virtual Visit via Telephone Note  I connected with  Brenda [name] on 07/05/20 at  8:00 AM EST by telephone and verified that I am speaking with the correct person using two identifiers.  Medicare Annual Wellness visit completed telephonically due to Covid-19 pandemic.   Persons participating in this call: This Health Coach and this patient.   Location: Patient: Home Provider: Office   I discussed the limitations, risks, security and privacy concerns of performing an evaluation and management service by telephone and the availability of in person appointments. The patient expressed understanding and agreed to proceed.  Unable to perform video visit due to video visit attempted and failed and/or patient does not have video capability.   Some vital signs may be absent or patient reported.   Brenda Schleinina H Betterson, LPN    Subjective:   Brenda [name] is a 80 y.o. female who presents for Medicare Annual (Subsequent) preventive examination.  Review of Systems     Cardiac Risk Factors include: advanced age (>1955men, 58>65 women);dyslipidemia;obesity (BMI >30kg/m2);hypertension     Objective:    There were no vitals filed for this visit. There is no height or weight on file to calculate BMI.  Advanced Directives 07/05/2020 06/02/2019 08/24/2018 12/08/2016  Does Patient Have a Medical Advance Directive? Yes Yes Yes Yes  Type of Estate agentAdvance Directive Healthcare Power of State Street Corporationttorney Healthcare Power of [place]ridgeAttorney;Living will Healthcare Power of [place]orney;Living will Living will  Does patient want to make changes to medical advance directive? - No - Patient declined - No - Patient declined  Copy of Healthcare Power of Attorney in Chart? No - copy requested No - copy requested - -    Current Medications (verified) Outpatient Encounter Medications as of 07/05/2020  Medication Sig  . aspirin 81 MG tablet Take 81 mg by mouth daily.  Marland Kitchen. atorvastatin (LIPITOR) 40 MG tablet TAKE 1 TABLET(40 MG) BY MOUTH DAILY   . CELEBREX 100 MG capsule Take 100 mg by mouth 2 (two) times daily.  . cholecalciferol (VITAMIN D3) 25 MCG (1000 UNIT) tablet Take 1,000 Units by mouth daily.  . furosemide (LASIX) 20 MG tablet TAKE 1 TABLET(20 MG) BY MOUTH DAILY AS NEEDED FOR FLUID RETENTION  . loratadine (CLARITIN) 10 MG tablet Take 10 mg by mouth daily as needed for allergies.   . metoprolol succinate (TOPROL XL) 25 MG 24 hr tablet Take 1 tablet (25 mg total) by mouth daily.  . minoxidil (ROGAINE) 2 % external solution Apply topically 2 (two) times daily.  Marland Kitchen. omeprazole (PRILOSEC) 20 MG capsule TAKE 1 CAPSULE(20 MG) BY MOUTH DAILY  . vitamin B-12 (CYANOCOBALAMIN) 1000 MCG tablet Take 1,000 mcg by mouth daily.   No facility-administered encounter medications on file as of 07/05/2020.    Allergies (verified) Patient has no known allergies.   History: Past Medical History:  Diagnosis Date  . Atrial fibrillation (HCC)   . Diverticulosis   . Foot fracture, left 01/2020   broken heel spur   . GERD (gastroesophageal reflux disease)   . Hypertension   . OSA (obstructive sleep apnea)    severe  . Paroxysmal A-fib [facility](HCC)    [facility]   Past Surgical History:  Procedure Laterality Date  . ATRIAL F[facility]     [employer]  . CATARACT EXTRACTION, BILATERAL  2020  . CHOLECYSTECTOMY  1977  . COLON RESECTION  2005   due to diverticulitis  . COLONOSCOPY     Multiple  . implantable loop recorder  implant  06/21/2020   Medtronic Reveal Linq model LNQ 22 (RLB [id number]) implantable loop recorder (with previously placed LINQ1 removed)  . LOOP RECORDER INSERTION N/A 12/08/2016   Procedure: LOOP RECORDER INSERTION;  Surgeon: Hillis Range, MD;  Location: [facility] INVASIVE CV LAB;  Service: Cardiovascular;  Laterality: N/A;  . TOTAL ABDOMINAL HYSTERECTOMY  1985   Family History  Problem Relation Age of Onset  . Stroke Mother   . Parkinson's disease Father   . Colon cancer Neg Hx   . Esophageal cancer  Neg Hx   . Pancreatic cancer Neg Hx   . Stomach cancer Neg Hx   . Liver disease Neg Hx    Social History   Socioeconomic History  . Marital status: Divorced    Spouse name: Not on file  . Number of children: Not on file  . Years of education: Not on file  . Highest education level: Not on file  Occupational History  . Occupation: retired   Tobacco Use  . Smoking status: Former Smoker    Packs/day: 0.50    Years: 48.00    Pack years: 24.00    Types: Cigarettes    Start date: 64    Quit date: 07/03/2006    Years since quitting: 14.0  . Smokeless tobacco: Never Used  Vaping Use  . Vaping Use: Never used  Substance and Sexual Activity  . Alcohol use: Yes    Alcohol/week: 1.0 standard drink    Types: 1 Glasses of wine per week    Comment: occasion  . Drug use: No  . Sexual activity: Never  Other Topics Concern  . Not on file  Social History Narrative   2018 moved to [place] from [place]   Retired Tourist information centre manager   3 daughters daughter in [place] is a physical therapist   2 cups of coffee a day no tobacco now though former smoker no drug use   Social Determinants of Corporate investment banker Strain: Low Risk   . Difficulty of Paying Living Expenses: Not hard at all  Food Insecurity: No Food Insecurity  . Worried About Programme researcher, broadcasting/film/video in the Last Year: Never true  . Ran Out of Food in the Last Year: Never true  Transportation Needs: No Transportation Needs  . Lack of Transportation (Medical): No  . Lack of Transportation (Non-Medical): No  Physical Activity: Insufficiently Active  . Days of Exercise per Week: 4 days  . Minutes of Exercise per Session: 30 min  Stress: No Stress Concern Present  . Feeling of Stress : Not at all  Social Connections: Moderately Isolated  . Frequency of Communication with Friends and Family: More than three times a week  . Frequency of Social Gatherings with Friends and Family: More than three times a week  . Attends  Religious Services: More than 4 times per year  . Active Member of Clubs or Organizations: No  . Attends Banker Meetings: Never  . Marital Status: Divorced    Tobacco Counseling Counseling given: Not Answered   Clinical Intake:  Pre-visit preparation completed: Yes  Pain : No/denies pain     BMI - recorded: 37.49 Nutritional Status: BMI > 30  Obese Nutritional Risks: None Diabetes: No  How often do you need to have someone help you when you read instructions, pamphlets, or other written materials from your doctor or pharmacy?: 1 - Never  Diabetic?No  Interpreter Needed?: No  Information entered by :: Lanier Ensign, LPN  Activities of Daily Living In your present state of health, do you have any difficulty performing the following activities: 07/05/2020  Hearing? N  Vision? N  Difficulty concentrating or making decisions? N  Walking or climbing stairs? N  Dressing or bathing? N  Doing errands, shopping? N  Preparing Food and eating ? N  Using the Toilet? N  In the past six months, have you accidently leaked urine? N  Do you have problems with loss of bowel control? N  Managing your Medications? N  Managing your Finances? N  Housekeeping or managing your Housekeeping? N  Some recent data might be hidden    Patient Care Team: Ardith Dark, MD as PCP - General (Family Medicine) Hillis Range, MD as Consulting Physician (Cardiology) [name], MD as Consulting Physician (Pulmonary Disease) Rodrigo Ran, OD as Consulting Physician (Ophthalmology) Bjorn Pippin, MD as Consulting Physician (Orthopedic Surgery)  Indicate any recent Medical Services you may have received from other than [facility] providers in the past year (date may be approximate).     Assessment:   This is a routine wellness examination for Brenda [name].  Hearing/Vision screen  Hearing Screening   125Hz  250Hz  500Hz  1000Hz  2000Hz  3000Hz  4000Hz  6000Hz  8000Hz   Right ear:            Left ear:           Comments: Pt denies any hearing issues   Vision Screening Comments: [facility]   Dietary issues and exercise activities discussed: Current Exercise Habits: Home exercise routine, Type of exercise: walking, Time (Minutes): 30, Frequency (Times/Week): 4, Weekly Exercise (Minutes/Week): 120  Goals    . Patient Stated     Losing weight       Depression Screen PHQ 2/9 Scores 07/05/2020 06/02/2019 10/24/2018 07/06/2017 01/06/2017  PHQ - 2 Score 0 0 0 0 0  PHQ- 9 Score - - - - 1    Fall Risk Fall Risk  07/05/2020 06/02/2019 10/24/2018 07/06/2017  Falls in the past year? 0 0 - No  Number falls in past yr: 0 0 0 -  Injury with Fall? 0 0 1 -  Comment - - wrist injury -  Follow up Falls prevention discussed Falls evaluation completed;Education provided;Falls prevention discussed - -    FALL RISK PREVENTION PERTAINING TO THE HOME:  Any stairs in or around the home? No  If so, are there any without handrails? No  Home free of loose throw rugs in walkways, pet beds, electrical cords, etc? Yes  Adequate lighting in your home to reduce risk of falls? Yes   ASSISTIVE DEVICES UTILIZED TO PREVENT FALLS:  Life alert? No  Use of a cane, walker or w/c? No  Grab bars in the bathroom? Yes  Shower chair or bench in shower? No  Elevated toilet seat or a handicapped toilet? Yes   TIMED UP AND GO:  Was the test performed? No .      Cognitive Function:     6CIT Screen 07/05/2020 06/02/2019  What Year? 0 points 0 points  What month? 0 points 0 points  What time? - 0 points  Count back from 20 0 points 0 points  Months in reverse 0 points 0 points  Repeat phrase 0 points 0 points  Total Score - 0    Immunizations Immunization History  Administered Date(s) Administered  . Fluad Quad(high Dose 65+) 02/06/2019  . Influenza Whole 02/15/2017  . Influenza, High Dose  Seasonal PF 01/22/2017  . Influenza,inj,Quad PF,6+ Mos  02/14/2018  . Influenza-Unspecified 02/14/2018, 03/29/2020  . PFIZER(Purple Top)SARS-COV-2 Vaccination 05/19/2019, 06/09/2019, 03/29/2020  . Pneumococcal Conjugate-13 02/06/2019  . Tdap 01/09/2018    TDAP status: Up to date  Flu Vaccine status: Up to date  Pneumococcal vaccine status: Due, Education has been provided regarding the importance of this vaccine. Advised may receive this vaccine at local pharmacy or Health Dept. Aware to provide a copy of the vaccination record if obtained from local pharmacy or Health Dept. Verbalized acceptance and understanding.  Covid-19 vaccine status: Completed vaccines  Qualifies for Shingles Vaccine? Yes   Zostavax completed No   Shingrix Completed?: No.    Education has been provided regarding the importance of this vaccine. Patient has been advised to call insurance company to determine out of pocket expense if they have not yet received this vaccine. Advised may also receive vaccine at local pharmacy or Health Dept. Verbalized acceptance and understanding.  Screening Tests Health Maintenance  Topic Date Due  . Hepatitis C Screening  Never done  . PNA vac Low Risk Adult (2 of 2 - PPSV23) 02/06/2020  . DEXA SCAN  07/05/2021 (Originally 05/21/2007)  . TETANUS/TDAP  01/10/2028  . INFLUENZA VACCINE  Completed  . COVID-19 Vaccine  Completed  . HPV VACCINES  Aged Out    Health Maintenance  Health Maintenance Due  Topic Date Due  . Hepatitis C Screening  Never done  . PNA vac Low Risk Adult (2 of 2 - PPSV23) 02/06/2020    Colorectal cancer screening: No longer required.   Mammogram status: Completed 01/02/20. Repeat every year  Bone density: declined at this time   Additional Screening:  Hepatitis C Screening: does qualify  Vision Screening: Recommended annual ophthalmology exams for early detection of glaucoma and other disorders of the eye. Is the patient up to date with their annual eye exam?  Yes  Who is the provider or what is the  name of the office in which the patient attends annual eye exams? Dr Parke Simmers If pt is not established with a provider, would they like to be referred to a provider to establish care? No .   Dental Screening: Recommended annual dental exams for proper oral hygiene  Community Resource Referral / Chronic Care Management: CRR required this visit?  No   CCM required this visit?  No      Plan:     I have personally reviewed and noted the following in the patient's chart:   . Medical and social history . Use of alcohol, tobacco or illicit drugs  . Current medications and supplements . Functional ability and status . Nutritional status . Physical activity . Advanced directives . List of other physicians . Hospitalizations, surgeries, and ER visits in previous 12 months . Vitals . Screenings to include cognitive, depression, and falls . Referrals and appointments  In addition, I have reviewed and discussed with patient certain preventive protocols, quality metrics, and best practice recommendations. A written personalized care plan for preventive services as well as general preventive health recommendations were provided to patient.     Brenda Schlein, LPN   05/10/5100   Nurse Notes: None

## 2020-07-06 NOTE — Progress Notes (Signed)
I have reviewed and agree with note, evaluation, plan. Signing for PCP who was out of office on date of service.   Stephen Hunter, MD   

## 2020-07-25 ENCOUNTER — Ambulatory Visit (INDEPENDENT_AMBULATORY_CARE_PROVIDER_SITE_OTHER): Payer: Medicare PPO

## 2020-07-25 DIAGNOSIS — I480 Paroxysmal atrial fibrillation: Secondary | ICD-10-CM

## 2020-07-25 LAB — CUP PACEART REMOTE DEVICE CHECK
Date Time Interrogation Session: 20220323221010
Implantable Pulse Generator Implant Date: 20220218

## 2020-08-02 DIAGNOSIS — G4733 Obstructive sleep apnea (adult) (pediatric): Secondary | ICD-10-CM | POA: Diagnosis not present

## 2020-08-06 NOTE — Progress Notes (Signed)
Carelink Summary Report / Loop Recorder 

## 2020-08-07 ENCOUNTER — Encounter: Payer: Medicare PPO | Admitting: Family Medicine

## 2020-08-22 DIAGNOSIS — M25571 Pain in right ankle and joints of right foot: Secondary | ICD-10-CM | POA: Diagnosis not present

## 2020-08-27 ENCOUNTER — Ambulatory Visit (INDEPENDENT_AMBULATORY_CARE_PROVIDER_SITE_OTHER): Payer: Medicare PPO

## 2020-08-27 ENCOUNTER — Other Ambulatory Visit: Payer: Self-pay

## 2020-08-27 ENCOUNTER — Ambulatory Visit: Payer: Medicare PPO | Admitting: Podiatry

## 2020-08-27 ENCOUNTER — Encounter: Payer: Self-pay | Admitting: Podiatry

## 2020-08-27 DIAGNOSIS — Q6672 Congenital pes cavus, left foot: Secondary | ICD-10-CM

## 2020-08-27 DIAGNOSIS — M76821 Posterior tibial tendinitis, right leg: Secondary | ICD-10-CM | POA: Diagnosis not present

## 2020-08-27 DIAGNOSIS — M7671 Peroneal tendinitis, right leg: Secondary | ICD-10-CM | POA: Diagnosis not present

## 2020-08-27 DIAGNOSIS — Q6671 Congenital pes cavus, right foot: Secondary | ICD-10-CM

## 2020-08-27 DIAGNOSIS — I480 Paroxysmal atrial fibrillation: Secondary | ICD-10-CM

## 2020-08-27 LAB — CUP PACEART REMOTE DEVICE CHECK
Date Time Interrogation Session: 20220425221036
Implantable Pulse Generator Implant Date: 20220218

## 2020-08-27 NOTE — Progress Notes (Signed)
  Subjective:  Patient ID: Brenda [name], female    DOB: 02/04/1941,  MRN: [id number]  Chief Complaint  Patient presents with  . Foot Orthotics      (NP)painful right ankle, high arch, orthotics consults, Delbert Harness referred    80 y.o. female presents with the above complaint. History confirmed with patient.  She was referred by Dr. Lucie Leather at [facility] orthopedics.  She has had pain since last September when she was walking and felt a pop in her plantar heel.  Thinks it was likely a plantar fascial rupture she was in a boot for about [place] better than she started having pain on the outside of each ankle on the inside of the foot.  Objective:  Physical Exam: warm, good capillary refill, no trophic changes or ulcerative lesions, normal DP and PT pulses and normal sensory exam.  Right Foot: She has pes cavus foot type with pain on palpation to the insertion of the posterior tibial tendon at the medial navicular as well as the insertion of the peroneus brevis into the fifth metatarsal base, no gross instability or dislocation, she has no evidence of fracture.  Radiographs: X-ray of the right foot and ankle: no fracture, dislocation, swelling or degenerative changes noted there does appear some enthesopathy at the fifth metatarsal base  Assessment:   1. Posterior tibial tendinitis of right lower extremity   2. Peroneal tendinitis of right lower extremity   3. Pes cavus of left foot   4. Pes cavus of right foot      Plan:  Patient was evaluated and treated and all questions answered.  I reviewed the radiographic and clinical exam findings with the patient in detail.  We discussed etiology and treatment options for posterior tibial tendinitis as well as peroneal tendinitis.  Unclear if this began a sequela of her plantar fascial injury back in the fall.  She is currently taking Celebrex which helps some.  She was in a CAM boot for a while.  I recommend we provide her some support  and a Tri-Lock ankle brace and begin rehab exercises which she will do at home.  I would like to order an MRI to evaluate for possible surgical planning for any tears or inflammation within the peroneus brevis and posterior tibial tendon.  Return after MRI is completed for review.  Suggest long-term that she may benefit from custom molded orthoses to support the arch and prevent excessive motion.  No follow-ups on file.

## 2020-08-27 NOTE — Patient Instructions (Signed)
Posterior Tibial Tendinitis  Posterior tibial tendinitis is irritation of a tendon called the posterior tibial tendon. Your posterior tibial tendon is a cord-like tissue that connects bones of your lower leg and foot to a muscle that: 1. Supports your arch. 2. Helps you raise up on your toes. 3. Helps you turn your foot down and in. This condition causes foot and ankle pain. It can also lead to a flat foot. What are the causes? This condition is most often caused by repeated stress to the tendon (overuse injury). It can also be caused by a sudden injury that stresses the tendon, such as landing on your foot after jumping or falling. What increases the risk? This condition is more likely to develop in: 1. People who play a sport that involves putting a lot of pressure on the feet, such as: 1. Basketball. 2. Tennis. 3. Soccer. 4. Hockey. 2. Runners. 3. Females who are older than 78 years of age and are overweight. 4. People with diabetes. 5. People with decreased foot stability. 6. People with flat feet. What are the signs or symptoms? Symptoms include: 1. Pain in the inner ankle. 2. Pain at the arch of your foot. 3. Pain that gets worse with running, walking, or standing. 4. Swelling on the inside of your ankle and foot. 5. Weakness in your ankle or foot. 6. Inability to stand up on tiptoe. 7. Flattening of the arch of your foot. How is this diagnosed? This condition may be diagnosed based on: 1. Your symptoms. 2. Your medical history. 3. A physical exam. 4. Tests, such as: 1. X-ray. 2. MRI. 3. Ultrasound. How is this treated? This condition may be treated by: 1. Putting ice to the injured area. 2. Taking NSAIDs, such as ibuprofen, to reduce pain and swelling. 3. Wearing a special shoe or shoe insert to support your arch (orthotic). 4. Having physical therapy. 5. Replacing high-impact exercise with low-impact exercise, such as swimming or cycling. If your symptoms do not  improve with these treatments, you may need to wear a splint, removable walking boot, or short leg cast for 6-8 weeks to keep your foot and ankle still (immobilized). Follow these instructions at home: If you have a cast, splint, or boot:  Keep it clean and dry.  Check the skin around it every day. Tell your health care provider about any concerns. If you have a cast:  Do not stick anything inside it to scratch your skin. Doing that increases your risk of infection.  You may put lotion on dry skin around the edges of the cast. Do not put lotion on the skin underneath the cast. If you have a splint or boot:  Wear it as told by your health care provider. Remove it only as told by your health care provider.  Loosen it if your toes tingle, become numb, or turn cold and blue. Bathing 1. Do not take baths, swim, or use a hot tub until your health care provider approves. Ask your health care provider if you may take showers. 2. If your cast, splint, or boot is not waterproof: ? Do not let it get wet. ? Cover it with a waterproof covering while you take a bath or a shower. Managing pain and swelling   1. If directed, put ice on the injured area. ? If you have a removable splint or boot, remove it as told by your health care provider. ? Put ice in a plastic bag. ? Place a towel between your   skin and the bag or between your cast and the bag. ? Leave the ice on for 20 minutes, 2-3 times a day. 2. Move your toes often to reduce stiffness and swelling. 3. Raise (elevate) the injured area above the level of your heart while you are sitting or lying down. Activity  Do not use the injured foot to support your body weight until your health care provider says that you can. Use crutches as told by your health care provider.  Do not do activities that make pain or swelling worse.  Ask your health care provider when it is safe to drive if you have a cast, splint, or boot on your foot.  Return to  your normal activities as told by your health care provider. Ask your health care provider what activities are safe for you.  Do exercises as told by your health care provider. General instructions  Take over-the-counter and prescription medicines only as told by your health care provider.  If you have an orthotic, use it as told by your health care provider.  Keep all follow-up visits as told by your health care provider. This is important. How is this prevented?  Wear footwear that is appropriate to your athletic activity.  Avoid athletic activities that cause pain or swelling in your ankle or foot.  Before being active, do range-of-motion and stretching exercises.  If you develop pain or swelling while training, stop training.  If you have pain or swelling that does not improve after a few days of rest, see your health care provider.  If you start a new athletic activity, start gradually so you can build up your strength and flexibility. Contact a health care provider if:  Your symptoms get worse.  Your symptoms do not improve in 6-8 weeks.  You develop new, unexplained symptoms.  Your splint, boot, or cast gets damaged. Summary  Posterior tibial tendinitis is irritation of a tendon called the posterior tibial tendon.  This condition is most often caused by repeated stress to the tendon (overuse injury).  This condition causes foot pain and ankle pain. It can also lead to a flat foot.  This condition may be treated by not doing high-impact activities, applying ice, having physical therapy, wearing orthotics, and wearing a cast, splint, or boot if needed. This information is not intended to replace advice given to you by your health care provider. Make sure you discuss any questions you have with your health care provider. Document Revised: 08/16/2018 Document Reviewed: 06/23/2018 Elsevier Patient Education  2020 [employer]  Posterior Tibial Tendinitis Rehab Ask  your health care provider which exercises are safe for you. Do exercises exactly as told by your health care provider and adjust them as directed. It is normal to feel mild stretching, pulling, tightness, or discomfort as you do these exercises. Stop right away if you feel sudden pain or your pain gets worse. Do not begin these exercises until told by your health care provider. Stretching and range-of-motion exercises These exercises warm up your muscles and joints and improve the movement and flexibility in your ankle and foot. These exercises may also help to relieve pain. Standing wall calf stretch, knee straight   4. Stand with your hands against a wall. 5. Extend your left / right leg behind you, and bend your front knee slightly. If directed, place a folded washcloth under the arch of your foot for support. 6. Point the toes of your back foot slightly inward. 7. Keeping your heels   on the floor and your back knee straight, shift your weight toward the wall. Do not allow your back to arch. You should feel a gentle stretch in your upper left / right calf. 8. Hold this position for 10 seconds. Repeat 10 times. Complete this exercise 2 times a day. Standing wall calf stretch, knee bent 7. Stand with your hands against a wall. 8. Extend your left / right leg behind you, and bend your front knee slightly. If directed, place a folded washcloth under the arch of your foot for support. 9. Point the toes of your back foot slightly inward. 10. Unlock your back knee so it is bent. Keep your heels on the floor. You should feel a gentle stretch deep in your lower left / right calf. 11. Hold this position for 10 seconds. Repeat 10 times. Complete this exercise 2 times a day. Strengthening exercises These exercises build strength and endurance in your ankle and foot. Endurance is the ability to use your muscles for a long time, even after they get tired. Ankle inversion with band 8. Secure one end of a  rubber exercise band or tubing to a fixed object, such as a table leg or a pole, that will stay still when the band is pulled. 9. Loop the other end of the band around the middle of your left / right foot. 10. Sit on the floor facing the object with your left / right leg extended. The band or tube should be slightly tense when your foot is relaxed. 11. Leading with your big toe, slowly bring your left / right foot and ankle inward, toward your other foot (inversion). 12. Hold this position for 10 seconds. 13. Slowly return your foot to the starting position. Repeat 10 times. Complete this exercise 2 times a day. Towel curls   5. Sit in a chair on a non-carpeted surface, and put your feet on the floor. 6. Place a towel in front of your feet. 7. Keeping your heel on the floor, put your left / right foot on the towel. 8. Pull the towel toward you by grabbing the towel with your toes and curling them under. Keep your heel on the floor while you do this. 9. Let your toes relax. 10. Grab the towel with your toes again. Keep going until the towel is completely underneath your foot. Repeat 10 times. Complete this exercise 2 times a day. Balance exercise This exercise improves or maintains your balance. Balance is important in preventing falls. Single leg stand 6. Without wearing shoes, stand near a railing or in a doorway. You may hold on to the railing or door frame as needed for balance. 7. Stand on your left / right foot. Keep your big toe down on the floor and try to keep your arch lifted. ? If balancing in this position is too easy, try the exercise with your eyes closed or while standing on a pillow. 8. Hold this position for 10 seconds. Repeat 10 times. Complete this exercise 2 times a day. This information is not intended to replace advice given to you by your health care provider. Make sure you discuss any questions you have with your health care provider.  

## 2020-08-28 ENCOUNTER — Telehealth: Payer: Self-pay | Admitting: *Deleted

## 2020-08-28 ENCOUNTER — Encounter: Payer: Self-pay | Admitting: Family Medicine

## 2020-08-28 ENCOUNTER — Ambulatory Visit (INDEPENDENT_AMBULATORY_CARE_PROVIDER_SITE_OTHER): Payer: Medicare PPO | Admitting: Family Medicine

## 2020-08-28 VITALS — BP 112/71 | HR 63 | Temp 97.6°F | Ht 64.0 in | Wt 217.0 lb

## 2020-08-28 DIAGNOSIS — E559 Vitamin D deficiency, unspecified: Secondary | ICD-10-CM | POA: Diagnosis not present

## 2020-08-28 DIAGNOSIS — E2839 Other primary ovarian failure: Secondary | ICD-10-CM

## 2020-08-28 DIAGNOSIS — I10 Essential (primary) hypertension: Secondary | ICD-10-CM | POA: Diagnosis not present

## 2020-08-28 DIAGNOSIS — E785 Hyperlipidemia, unspecified: Secondary | ICD-10-CM

## 2020-08-28 DIAGNOSIS — Z0001 Encounter for general adult medical examination with abnormal findings: Secondary | ICD-10-CM

## 2020-08-28 DIAGNOSIS — I480 Paroxysmal atrial fibrillation: Secondary | ICD-10-CM | POA: Diagnosis not present

## 2020-08-28 DIAGNOSIS — E538 Deficiency of other specified B group vitamins: Secondary | ICD-10-CM | POA: Diagnosis not present

## 2020-08-28 DIAGNOSIS — Z23 Encounter for immunization: Secondary | ICD-10-CM

## 2020-08-28 DIAGNOSIS — R739 Hyperglycemia, unspecified: Secondary | ICD-10-CM | POA: Diagnosis not present

## 2020-08-28 DIAGNOSIS — M1990 Unspecified osteoarthritis, unspecified site: Secondary | ICD-10-CM | POA: Diagnosis not present

## 2020-08-28 DIAGNOSIS — K219 Gastro-esophageal reflux disease without esophagitis: Secondary | ICD-10-CM

## 2020-08-28 LAB — COMPREHENSIVE METABOLIC PANEL
ALT: 20 U/L (ref 0–35)
AST: 19 U/L (ref 0–37)
Albumin: 4.2 g/dL (ref 3.5–5.2)
Alkaline Phosphatase: 86 U/L (ref 39–117)
BUN: 14 mg/dL (ref 6–23)
CO2: 29 mEq/L (ref 19–32)
Calcium: 9.9 mg/dL (ref 8.4–10.5)
Chloride: 102 mEq/L (ref 96–112)
Creatinine, Ser: 0.99 mg/dL (ref 0.40–1.20)
GFR: 54.71 mL/min — ABNORMAL LOW (ref >60.00)
Glucose, Bld: 87 mg/dL (ref 70–99)
Potassium: 4.4 mEq/L (ref 3.5–5.1)
Sodium: 140 mEq/L (ref 135–145)
Total Bilirubin: 0.8 mg/dL (ref 0.2–1.2)
Total Protein: 6.7 g/dL (ref 6.0–8.3)

## 2020-08-28 LAB — CBC
HCT: 41 % (ref 36.0–46.0)
Hemoglobin: 13.6 g/dL (ref 12.0–15.0)
MCHC: 33.1 g/dL (ref 30.0–36.0)
MCV: 93.1 fl (ref 78.0–100.0)
Platelets: 185 10*3/uL (ref 150.0–400.0)
RBC: 4.4 Mil/uL (ref 3.87–5.11)
RDW: 13.7 % (ref 11.5–15.5)
WBC: 5.9 10*3/uL (ref 4.0–10.5)

## 2020-08-28 LAB — LIPID PANEL
Cholesterol: 139 mg/dL (ref 0–200)
HDL: 59.9 mg/dL (ref >39.00)
LDL Cholesterol: 50 mg/dL (ref 0–99)
NonHDL: 78.68
Total CHOL/HDL Ratio: 2
Triglycerides: 142 mg/dL (ref 0.0–149.0)
VLDL: 28.4 mg/dL (ref 0.0–40.0)

## 2020-08-28 LAB — VITAMIN B12: Vitamin B-12: >1506 pg/mL — ABNORMAL HIGH (ref 211–911)

## 2020-08-28 LAB — VITAMIN D 25 HYDROXY (VIT D DEFICIENCY, FRACTURES): VITD: 24.24 ng/mL — ABNORMAL LOW (ref 30.00–100.00)

## 2020-08-28 LAB — TSH: TSH: 4.4 u[IU]/mL (ref 0.35–4.50)

## 2020-08-28 LAB — HEMOGLOBIN A1C: Hgb A1c MFr Bld: 5.8 % (ref 4.6–6.5)

## 2020-08-28 NOTE — Assessment & Plan Note (Signed)
Continue omeprazole 20 mg daily.  Check B12.

## 2020-08-28 NOTE — Assessment & Plan Note (Signed)
Sinus rhythm today.  Rate controlled on metoprolol 25 mg daily.

## 2020-08-28 NOTE — Progress Notes (Signed)
Chief Complaint:  Brenda [name] is a 80 y.o. female who presents today for her annual comprehensive physical exam.    Assessment/Plan:  Chronic Problems Addressed Today: Hyperglycemia Check A1c.  B12 deficiency Check B12.  Continue 1000 mcg daily.  Vitamin D deficiency Check vitamin D.  Continue 1000 IUs daily.  Dyslipidemia Check lipids today.  Continue Lipitor 40 mg daily.  Osteoarthritis Continue OTC analgesics as needed.  GERD (gastroesophageal reflux disease) Continue omeprazole 20 mg daily.  Check B12.  Hypertension At goal.  Continue metoprolol succinate 25 mg daily per cardiology.  Paroxysmal A-fib (HCC) Sinus rhythm today.  Rate controlled on metoprolol 25 mg daily.  Body mass index is 37.25 kg/m. / Obese  BMI Metric Follow Up - 08/28/20 0859      BMI Metric Follow Up-Please document annually   BMI Metric Follow Up Education provided           Preventative Healthcare: Check labs today.  Will place order for DEXA scan.  Pneumovax given today.  She will be getting mammogram later this year.  Up-to-date on other vaccines and screenings.  Patient Counseling(The following topics were reviewed and/or handout was given):  -Nutrition: Stressed importance of moderation in sodium/caffeine intake, saturated fat and cholesterol, caloric balance, sufficient intake of fresh fruits, vegetables, and fiber.  -Stressed the importance of regular exercise.   -Substance Abuse: Discussed cessation/primary prevention of tobacco, alcohol, or other drug use; driving or other dangerous activities under the influence; availability of treatment for abuse.   -Injury prevention: Discussed safety belts, safety helmets, smoke detector, smoking near bedding or upholstery.   -Sexuality: Discussed sexually transmitted diseases, partner selection, use of condoms, avoidance of unintended pregnancy and contraceptive alternatives.   -Dental health: Discussed importance of regular tooth  brushing, flossing, and dental visits.  -Health maintenance and immunizations reviewed. Please refer to Health maintenance section.  Return to care in 1 year for next preventative visit.     Subjective:  HPI:  She has no acute complaints today.   Lifestyle Diet: Balanced.  Exercise: Limited due to ankle pain.   Depression screen PHQ 2/9 07/05/2020  Decreased Interest 0  Down, Depressed, Hopeless 0  PHQ - 2 Score 0  Altered sleeping -  Tired, decreased energy -  Change in appetite -  Feeling bad or failure about yourself  -  Trouble concentrating -  Moving slowly or fidgety/restless -  Suicidal thoughts -  PHQ-9 Score -  Difficult doing work/chores -    Health Maintenance Due  Topic Date Due  . Hepatitis C Screening  Never done     ROS: Per HPI, otherwise a complete review of systems was negative.   PMH:  The following were reviewed and entered/updated in epic: Past Medical History:  Diagnosis Date  . Atrial fibrillation (HCC)   . Diverticulosis   . Foot fracture, left 01/2020   broken heel spur   . GERD (gastroesophageal reflux disease)   . Hypertension   . OSA (obstructive sleep apnea)    severe  . Paroxysmal A-fib [facility])    s/p afib ablation at [facility]   Patient Active Problem List   Diagnosis Date Noted  . Hyperglycemia 08/28/2020  . Vitamin D deficiency 06/05/2019  . B12 deficiency 06/05/2019  . Seasonal allergies 10/24/2018  . Dyslipidemia 10/24/2018  . Osteoarthritis 07/06/2017  . Paroxysmal A-fib (HCC)   . OSA (obstructive sleep apnea)   . Hypertension   . GERD (gastroesophageal reflux disease)   . Diverticulosis  Past Surgical History:  Procedure Laterality Date  . ATRIAL FIBRILLATION ABLATION     [employer]  . CATARACT EXTRACTION, BILATERAL  2020  . CHOLECYSTECTOMY  1977  . COLON RESECTION  2005   due to diverticulitis  . COLONOSCOPY     Multiple  . implantable loop recorder implant  06/21/2020   Medtronic Reveal Linq model LNQ  22 (RLB [id number]) implantable loop recorder (with previously placed LINQ1 removed)  . LOOP RECORDER INSERTION N/A 12/08/2016   Procedure: LOOP RECORDER INSERTION;  Surgeon: Hillis Range, MD;  Location: [facility] INVASIVE CV LAB;  Service: Cardiovascular;  Laterality: N/A;  . TOTAL ABDOMINAL HYSTERECTOMY  1985    Family History  Problem Relation Age of Onset  . Stroke Mother   . Parkinson's disease Father   . Colon cancer Neg Hx   . Esophageal cancer Neg Hx   . Pancreatic cancer Neg Hx   . Stomach cancer Neg Hx   . Liver disease Neg Hx     Medications- reviewed and updated Current Outpatient Medications  Medication Sig Dispense Refill  . aspirin 81 MG tablet Take 81 mg by mouth daily.    Marland Kitchen atorvastatin (LIPITOR) 40 MG tablet TAKE 1 TABLET(40 MG) BY MOUTH DAILY 90 tablet 3  . CELEBREX 100 MG capsule Take 100 mg by mouth 2 (two) times daily.    . cholecalciferol (VITAMIN D3) 25 MCG (1000 UNIT) tablet Take 1,000 Units by mouth daily.    . furosemide (LASIX) 20 MG tablet TAKE 1 TABLET(20 MG) BY MOUTH DAILY AS NEEDED FOR FLUID RETENTION 90 tablet 3  . loratadine (CLARITIN) 10 MG tablet Take 10 mg by mouth daily as needed for allergies.     . metoprolol succinate (TOPROL XL) 25 MG 24 hr tablet Take 1 tablet (25 mg total) by mouth daily. 90 tablet 3  . minoxidil (ROGAINE) 2 % external solution Apply topically 2 (two) times daily.    Marland Kitchen omeprazole (PRILOSEC) 20 MG capsule TAKE 1 CAPSULE(20 MG) BY MOUTH DAILY 90 capsule 3  . vitamin B-12 (CYANOCOBALAMIN) 1000 MCG tablet Take 1,000 mcg by mouth daily.     No current facility-administered medications for this visit.    Allergies-reviewed and updated No Known Allergies  Social History   Socioeconomic History  . Marital status: Divorced    Spouse name: Not on file  . Number of children: Not on file  . Years of education: Not on file  . Highest education level: Not on file  Occupational History  . Occupation: retired   Tobacco Use  . Smoking  status: Former Smoker    Packs/day: 0.50    Years: 48.00    Pack years: 24.00    Types: Cigarettes    Start date: 54    Quit date: 07/03/2006    Years since quitting: 14.1  . Smokeless tobacco: Never Used  Vaping Use  . Vaping Use: Never used  Substance and Sexual Activity  . Alcohol use: Yes    Alcohol/week: 1.0 standard drink    Types: 1 Glasses of wine per week    Comment: occasion  . Drug use: No  . Sexual activity: Never  Other Topics Concern  . Not on file  Social History Narrative   2018 moved to [place] from [place]   Retired Tourist information centre manager   3 daughters daughter in [place] is a physical therapist   2 cups of coffee a day no tobacco now though former smoker no drug use   Social  Determinants of Health   Financial Resource Strain: Low Risk   . Difficulty of Paying Living Expenses: Not hard at all  Food Insecurity: No Food Insecurity  . Worried About Programme researcher, broadcasting/film/video in the Last Year: Never true  . Ran Out of Food in the Last Year: Never true  Transportation Needs: No Transportation Needs  . Lack of Transportation (Medical): No  . Lack of Transportation (Non-Medical): No  Physical Activity: Insufficiently Active  . Days of Exercise per Week: 4 days  . Minutes of Exercise per Session: 30 min  Stress: No Stress Concern Present  . Feeling of Stress : Not at all  Social Connections: Moderately Isolated  . Frequency of Communication with Friends and Family: More than three times a week  . Frequency of Social Gatherings with Friends and Family: More than three times a week  . Attends Religious Services: More than 4 times per year  . Active Member of Clubs or Organizations: No  . Attends Banker Meetings: Never  . Marital Status: Divorced        Objective:  Physical Exam: BP 112/71   Pulse 63   Temp 97.6 F (36.4 C) (Temporal)   Ht 5\' 4"  (1.626 m)   Wt 217 lb (98.4 kg)   SpO2 99%   BMI 37.25 kg/m   Body mass index is 37.25  kg/m. Wt Readings from Last 3 Encounters:  08/28/20 217 lb (98.4 kg)  06/21/20 218 lb 6.4 oz (99.1 kg)  05/29/20 213 lb 6.4 oz (96.8 kg)   Gen: NAD, resting comfortably HEENT: TMs normal bilaterally. OP clear. No thyromegaly noted.  CV: RRR with no murmurs appreciated Pulm: NWOB, CTAB with no crackles, wheezes, or rhonchi GI: Normal bowel sounds present. Soft, Nontender, Nondistended. MSK: no edema, cyanosis, or clubbing noted Skin: warm, dry Neuro: CN2-12 grossly intact. Strength 5/5 in upper and lower extremities. Reflexes symmetric and intact bilaterally.  Psych: Normal affect and thought content     Shemia Bevel M. 05/31/20, MD 08/28/2020 9:00 AM

## 2020-08-28 NOTE — Assessment & Plan Note (Signed)
Check A1c. 

## 2020-08-28 NOTE — Assessment & Plan Note (Signed)
Check B12.  Continue 1000 mcg daily.

## 2020-08-28 NOTE — Assessment & Plan Note (Signed)
Check lipids today.  Continue Lipitor 40 mg daily.

## 2020-08-28 NOTE — Patient Instructions (Signed)
It was very nice to see you today!  We will check blood work today.  We gave your pneumonia vaccine.  Please get your bone density scan when you get your mammogram later this year.  Please keep up the good work with your diet and exercise.  I will see you back in 1 year for your next physical.  Come back to see me sooner if needed.  Take care, Dr Jerline Pain  PLEASE NOTE:  If you had any lab tests please let us know if you have not heard back within a few days. You may see your results on mychart before we have a chance to review them but we will give you a call once they are reviewed by [place]. If we ordered any referrals today, please let us know if you have not heard from their office within the next week.   Please try these tips to maintain a healthy lifestyle:   Eat at least 3 REAL meals and 1-2 snacks per day.  Aim for no more than 5 hours between eating.  If you eat breakfast, please do so within one hour of getting up.    Each meal should contain half fruits/vegetables, one quarter protein, and one quarter carbs (no bigger than a computer mouse)   Cut down on sweet beverages. This includes juice, soda, and sweet tea.     Drink at least 1 glass of water with each meal and aim for at least 8 glasses per day   Exercise at least 150 minutes every week.    Preventive Care 69 Years and Older, Female Preventive care refers to lifestyle choices and visits with your health care provider that can promote health and wellness. This includes:  A yearly physical exam. This is also called an annual wellness visit.  Regular dental and eye exams.  Immunizations.  Screening for certain conditions.  Healthy lifestyle choices, such as: ? Eating a healthy diet. ? Getting regular exercise. ? Not using drugs or products that contain nicotine and tobacco. ? Limiting alcohol use. What can I expect for my preventive care visit? Physical exam Your health care provider will check  your:  Height and weight. These may be used to calculate your BMI (body mass index). BMI is a measurement that tells if you are at a healthy weight.  Heart rate and blood pressure.  Body temperature.  Skin for abnormal spots. Counseling Your health care provider may ask you questions about your:  Past medical problems.  Family's medical history.  Alcohol, tobacco, and drug use.  Emotional well-being.  Home life and relationship well-being.  Sexual activity.  Diet, exercise, and sleep habits.  History of falls.  Memory and ability to understand (cognition).  Work and work Statistician.  Pregnancy and menstrual history.  Access to firearms. What immunizations do I need? Vaccines are usually given at various ages, according to a schedule. Your health care provider will recommend vaccines for you based on your age, medical history, and lifestyle or other factors, such as travel or where you work.   What tests do I need? Blood tests  Lipid and cholesterol levels. These may be checked every 5 years, or more often depending on your overall health.  Hepatitis C test.  Hepatitis B test. Screening  Lung cancer screening. You may have this screening every year starting at age 26 if you have a 30-pack-year history of smoking and currently smoke or have quit within the past 15 years.  Colorectal cancer screening. ?  All adults should have this screening starting at age 38 and continuing until age 65. ? Your health care provider may recommend screening at age 6 if you are at increased risk. ? You will have tests every 1-10 years, depending on your results and the type of screening test.  Diabetes screening. ? This is done by checking your blood sugar (glucose) after you have not eaten for a while (fasting). ? You may have this done every 1-3 years.  Mammogram. ? This may be done every 1-2 years. ? Talk with your health care provider about how often you should have regular  mammograms.  Abdominal aortic aneurysm (AAA) screening. You may need this if you are a current or former smoker.  BRCA-related cancer screening. This may be done if you have a family history of breast, ovarian, tubal, or peritoneal cancers. Other tests  STD (sexually transmitted disease) testing, if you are at risk.  Bone density scan. This is done to screen for osteoporosis. You may have this done starting at age 61. Talk with your health care provider about your test results, treatment options, and if necessary, the need for more tests. Follow these instructions at home: Eating and drinking  Eat a diet that includes fresh fruits and vegetables, whole grains, lean protein, and low-fat dairy products. Limit your intake of foods with high amounts of sugar, saturated fats, and salt.  Take vitamin and mineral supplements as recommended by your health care provider.  Do not drink alcohol if your health care provider tells you not to drink.  If you drink alcohol: ? Limit how much you have to 0-1 drink a day. ? Be aware of how much alcohol is in your drink. In the [place] one drink equals one 12 oz bottle of beer (355 mL), one 5 oz glass of wine (148 mL), or one 1 oz glass of hard liquor (44 mL).   Lifestyle  Take daily care of your teeth and gums. Brush your teeth every morning and night with fluoride toothpaste. Floss one time each day.  Stay active. Exercise for at least 30 minutes 5 or more days each week.  Do not use any products that contain nicotine or tobacco, such as cigarettes, e-cigarettes, and chewing tobacco. If you need help quitting, ask your health care provider.  Do not use drugs.  If you are sexually active, practice safe sex. Use a condom or other form of protection in order to prevent STIs (sexually transmitted infections).  Talk with your health care provider about taking a low-dose aspirin or statin.  Find healthy ways to cope with stress, such as: ? Meditation,  yoga, or listening to music. ? Journaling. ? Talking to a trusted person. ? Spending time with friends and family. Safety  Always wear your seat belt while driving or riding in a vehicle.  Do not drive: ? If you have been drinking alcohol. Do not ride with someone who has been drinking. ? When you are tired or distracted. ? While texting.  Wear a helmet and other protective equipment during sports activities.  If you have firearms in your house, make sure you follow all gun safety procedures. What's next?  Visit your health care provider once a year for an annual wellness visit.  Ask your health care provider how often you should have your eyes and teeth checked.  Stay up to date on all vaccines. This information is not intended to replace advice given to you by your health care provider.  Make sure you discuss any questions you have with your health care provider. Document Revised: 04/10/2020 Document Reviewed: 04/14/2018 Elsevier Patient Education  2021 [employer].

## 2020-08-28 NOTE — Assessment & Plan Note (Signed)
Check vitamin D.  Continue 1000 IUs daily.

## 2020-08-28 NOTE — Assessment & Plan Note (Signed)
At goal.  Continue metoprolol succinate 25 mg daily per cardiology.

## 2020-08-28 NOTE — Assessment & Plan Note (Signed)
Continue OTC analgesics as needed.

## 2020-08-29 NOTE — Progress Notes (Signed)
Please inform patient of the following:  Her vitamin D is low.  Please make sure that she is taking at least 1000 to 2000 international units weekly.  She is already doing this we can start prescription replacement of 50,000 international units.  We should recheck in 3 to 6 months.  All of her other labs are normal.

## 2020-08-30 ENCOUNTER — Other Ambulatory Visit: Payer: Self-pay | Admitting: *Deleted

## 2020-08-30 MED ORDER — VITAMIN D (ERGOCALCIFEROL) 1.25 MG (50000 UNIT) PO CAPS: 50000.0000 [IU] | ORAL_CAPSULE | ORAL | 0 refills | Status: DC

## 2020-08-31 ENCOUNTER — Ambulatory Visit
Admission: RE | Admit: 2020-08-31 | Discharge: 2020-08-31 | Disposition: A | Discharge status: Home / Self Care | Payer: Medicare PPO | Source: Ambulatory Visit | Attending: Podiatry | Admitting: Podiatry

## 2020-08-31 ENCOUNTER — Other Ambulatory Visit: Payer: Self-pay

## 2020-08-31 DIAGNOSIS — M722 Plantar fascial fibromatosis: Secondary | ICD-10-CM | POA: Diagnosis not present

## 2020-08-31 DIAGNOSIS — M7731 Calcaneal spur, right foot: Secondary | ICD-10-CM | POA: Diagnosis not present

## 2020-08-31 DIAGNOSIS — S86311A Strain of muscle(s) and tendon(s) of peroneal muscle group at lower leg level, right leg, initial encounter: Secondary | ICD-10-CM | POA: Diagnosis not present

## 2020-08-31 DIAGNOSIS — M76821 Posterior tibial tendinitis, right leg: Secondary | ICD-10-CM

## 2020-08-31 DIAGNOSIS — M7671 Peroneal tendinitis, right leg: Secondary | ICD-10-CM

## 2020-09-09 ENCOUNTER — Telehealth: Payer: Self-pay | Admitting: *Deleted

## 2020-09-09 NOTE — Telephone Encounter (Signed)
Patient is requesting MRI results and want to know if she should schedule a sooner appointment before May 31st? Please advise.

## 2020-09-10 NOTE — Telephone Encounter (Signed)
No that appointment timeframe would still be best I would like her to have more time with doing the rehab exercises that I gave her and wearing the ankle brace until then

## 2020-09-11 NOTE — Telephone Encounter (Signed)
Called patient and informed per physician thru [facility] concerning scheduling sooner appointment.  Requesting her MRI results from 08/27/20.Please advise

## 2020-09-11 NOTE — Telephone Encounter (Signed)
Error

## 2020-09-16 NOTE — Progress Notes (Signed)
Carelink Summary Report / Loop Recorder 

## 2020-09-23 ENCOUNTER — Other Ambulatory Visit: Payer: Self-pay | Admitting: Family Medicine

## 2020-09-23 DIAGNOSIS — Z1231 Encounter for screening mammogram for malignant neoplasm of breast: Secondary | ICD-10-CM

## 2020-09-30 LAB — CUP PACEART REMOTE DEVICE CHECK
Date Time Interrogation Session: 20220528221150
Implantable Pulse Generator Implant Date: 20220218

## 2020-10-01 ENCOUNTER — Ambulatory Visit (INDEPENDENT_AMBULATORY_CARE_PROVIDER_SITE_OTHER): Payer: Medicare PPO

## 2020-10-01 ENCOUNTER — Telehealth: Payer: Self-pay

## 2020-10-01 ENCOUNTER — Encounter: Payer: Self-pay | Admitting: Podiatry

## 2020-10-01 ENCOUNTER — Ambulatory Visit (INDEPENDENT_AMBULATORY_CARE_PROVIDER_SITE_OTHER): Payer: Medicare PPO | Admitting: Podiatry

## 2020-10-01 ENCOUNTER — Other Ambulatory Visit: Payer: Self-pay

## 2020-10-01 DIAGNOSIS — I480 Paroxysmal atrial fibrillation: Secondary | ICD-10-CM | POA: Diagnosis not present

## 2020-10-01 DIAGNOSIS — M76821 Posterior tibial tendinitis, right leg: Secondary | ICD-10-CM

## 2020-10-01 DIAGNOSIS — S86311D Strain of muscle(s) and tendon(s) of peroneal muscle group at lower leg level, right leg, subsequent encounter: Secondary | ICD-10-CM

## 2020-10-01 NOTE — Telephone Encounter (Signed)
Demographics and order to evaluate and treat M76.821 and  [phone], twice weekly for 6 to 8 weeks faxed to [facility]

## 2020-10-01 NOTE — Patient Instructions (Addendum)
[facility] [phone]

## 2020-10-01 NOTE — Progress Notes (Signed)
Subjective:  Patient ID: Brenda [name], female    DOB: 11-24-1940,  MRN: [id number]  Chief Complaint  Patient presents with  . Plantar Fasciitis    4 week follow up, right foot    80 y.o. female returns for follow-up with the above complaint. History confirmed with patient.  Overall her pain is about the same she has been using a brace and doing the exercises are recommended  Objective:  Physical Exam: warm, good capillary refill, no trophic changes or ulcerative lesions, normal DP and PT pulses and normal sensory exam.  Right Foot: She has pes cavus foot type with pain on palpation to the insertion of the posterior tibial tendon at the medial navicular as well as the insertion of the peroneus brevis into the fifth metatarsal base, no gross instability or dislocation, she has no evidence of fracture.  Radiographs: X-ray of the right foot and ankle: no fracture, dislocation, swelling or degenerative changes noted there does appear some enthesopathy at the fifth metatarsal base   Study Result  Narrative & Impression  CLINICAL DATA:  Ankle pain posteriorly. Pain in the arch of the foot.  EXAM: MRI OF THE RIGHT ANKLE WITHOUT CONTRAST  TECHNIQUE: Multiplanar, multisequence MR imaging of the ankle was performed. No intravenous contrast was administered.  COMPARISON:  None.  FINDINGS: TENDONS  Peroneal: Peroneal longus tendon intact. Moderate tendinosis of the peroneus brevis with a longitudinal split tear.  Posteromedial: Mild tendinosis of the posterior tibial tendon. Flexor hallucis longus tendon intact. Flexor digitorum longus tendon intact.  Anterior: Tibialis anterior tendon intact. Extensor hallucis longus tendon intact Extensor digitorum longus tendon intact.  Achilles:  Intact.  Plantar Fascia: Small plantar calcaneal spur. Severe thickening and increased signal of the medial band of the plantar fascia at the calcaneal insertion consistent with  severe plantar fasciitis with a partial-thickness tear.  LIGAMENTS  Lateral: Anterior talofibular ligament intact. Calcaneofibular ligament intact. Posterior talofibular ligament intact. Anterior and posterior tibiofibular ligaments intact.  Medial: Deltoid ligament intact. Spring ligament intact.  CARTILAGE  Ankle Joint: No joint effusion. Normal ankle mortise. No chondral defect.  Subtalar Joints/Sinus Tarsi: Normal subtalar joints. No subtalar joint effusion. Normal sinus tarsi.  Bones: No marrow signal abnormality.  No fracture or dislocation.  Soft Tissue: No fluid collection or hematoma. Muscles are normal without edema or atrophy. Tarsal tunnel is normal.  IMPRESSION: 1. Severe plantar fasciitis of the medial band of the plantar fascia at the calcaneal insertion with a partial thickness tear. 2. Moderate tendinosis of the peroneus brevis with a longitudinal split tear.   Electronically Signed   By: Elige Ko   On: 09/01/2020 10:55    Assessment:   1. Posterior tibial tendinitis of right lower extremity   2. Tear of peroneal tendon, right, subsequent encounter      Plan:  Patient was evaluated and treated and all questions answered.  Reviewed the MRI in detail with the patient and her daughter.  Her daughter is a physical therapist.  We discussed further treatment options with both surgical and nonsurgical treatment.  Nonsurgical we discussed physical therapy, continued immobilization in the brace or boot and possible PRP injection therapy.  Discussed with him that if this fails or is not helpful then could consider surgical repair of the peroneal tendon tear.  I reviewed the images independently and appears to be a small segment split tear which at her age may be best served with nonoperative treatment.  Does not have a tear on PT  tendon.  She does have plantar fasciitis which we could consider fasciotomy if not improving.  Return in about 1 month  (around 10/31/2020).

## 2020-10-17 DIAGNOSIS — R262 Difficulty in walking, not elsewhere classified: Secondary | ICD-10-CM | POA: Diagnosis not present

## 2020-10-17 DIAGNOSIS — R531 Weakness: Secondary | ICD-10-CM | POA: Diagnosis not present

## 2020-10-17 DIAGNOSIS — M25674 Stiffness of right foot, not elsewhere classified: Secondary | ICD-10-CM | POA: Diagnosis not present

## 2020-10-17 DIAGNOSIS — M25571 Pain in right ankle and joints of right foot: Secondary | ICD-10-CM | POA: Diagnosis not present

## 2020-10-17 DIAGNOSIS — M25374 Other instability, right foot: Secondary | ICD-10-CM | POA: Diagnosis not present

## 2020-10-21 DIAGNOSIS — M25374 Other instability, right foot: Secondary | ICD-10-CM | POA: Diagnosis not present

## 2020-10-21 DIAGNOSIS — R531 Weakness: Secondary | ICD-10-CM | POA: Diagnosis not present

## 2020-10-21 DIAGNOSIS — M25674 Stiffness of right foot, not elsewhere classified: Secondary | ICD-10-CM | POA: Diagnosis not present

## 2020-10-21 DIAGNOSIS — M25571 Pain in right ankle and joints of right foot: Secondary | ICD-10-CM | POA: Diagnosis not present

## 2020-10-21 DIAGNOSIS — R262 Difficulty in walking, not elsewhere classified: Secondary | ICD-10-CM | POA: Diagnosis not present

## 2020-10-23 NOTE — Progress Notes (Signed)
Carelink Summary Report / Loop Recorder 

## 2020-10-24 DIAGNOSIS — R262 Difficulty in walking, not elsewhere classified: Secondary | ICD-10-CM | POA: Diagnosis not present

## 2020-10-24 DIAGNOSIS — M25674 Stiffness of right foot, not elsewhere classified: Secondary | ICD-10-CM | POA: Diagnosis not present

## 2020-10-24 DIAGNOSIS — M25571 Pain in right ankle and joints of right foot: Secondary | ICD-10-CM | POA: Diagnosis not present

## 2020-10-24 DIAGNOSIS — M25374 Other instability, right foot: Secondary | ICD-10-CM | POA: Diagnosis not present

## 2020-10-24 DIAGNOSIS — R531 Weakness: Secondary | ICD-10-CM | POA: Diagnosis not present

## 2020-10-25 DIAGNOSIS — M25571 Pain in right ankle and joints of right foot: Secondary | ICD-10-CM | POA: Diagnosis not present

## 2020-10-25 DIAGNOSIS — M25674 Stiffness of right foot, not elsewhere classified: Secondary | ICD-10-CM | POA: Diagnosis not present

## 2020-10-25 DIAGNOSIS — M25374 Other instability, right foot: Secondary | ICD-10-CM | POA: Diagnosis not present

## 2020-10-25 DIAGNOSIS — R531 Weakness: Secondary | ICD-10-CM | POA: Diagnosis not present

## 2020-10-25 DIAGNOSIS — R262 Difficulty in walking, not elsewhere classified: Secondary | ICD-10-CM | POA: Diagnosis not present

## 2020-10-29 DIAGNOSIS — M25571 Pain in right ankle and joints of right foot: Secondary | ICD-10-CM | POA: Diagnosis not present

## 2020-10-29 DIAGNOSIS — R262 Difficulty in walking, not elsewhere classified: Secondary | ICD-10-CM | POA: Diagnosis not present

## 2020-10-29 DIAGNOSIS — M25374 Other instability, right foot: Secondary | ICD-10-CM | POA: Diagnosis not present

## 2020-10-29 DIAGNOSIS — R531 Weakness: Secondary | ICD-10-CM | POA: Diagnosis not present

## 2020-10-29 DIAGNOSIS — M25674 Stiffness of right foot, not elsewhere classified: Secondary | ICD-10-CM | POA: Diagnosis not present

## 2020-10-31 DIAGNOSIS — M25374 Other instability, right foot: Secondary | ICD-10-CM | POA: Diagnosis not present

## 2020-10-31 DIAGNOSIS — M25571 Pain in right ankle and joints of right foot: Secondary | ICD-10-CM | POA: Diagnosis not present

## 2020-10-31 DIAGNOSIS — R262 Difficulty in walking, not elsewhere classified: Secondary | ICD-10-CM | POA: Diagnosis not present

## 2020-10-31 DIAGNOSIS — M25674 Stiffness of right foot, not elsewhere classified: Secondary | ICD-10-CM | POA: Diagnosis not present

## 2020-10-31 DIAGNOSIS — R531 Weakness: Secondary | ICD-10-CM | POA: Diagnosis not present

## 2020-11-01 ENCOUNTER — Ambulatory Visit (INDEPENDENT_AMBULATORY_CARE_PROVIDER_SITE_OTHER): Payer: Medicare PPO

## 2020-11-01 DIAGNOSIS — I480 Paroxysmal atrial fibrillation: Secondary | ICD-10-CM | POA: Diagnosis not present

## 2020-11-04 LAB — CUP PACEART REMOTE DEVICE CHECK
Date Time Interrogation Session: 20220630220916
Implantable Pulse Generator Implant Date: 20220218

## 2020-11-05 ENCOUNTER — Ambulatory Visit: Payer: Medicare PPO | Admitting: Podiatry

## 2020-11-05 ENCOUNTER — Other Ambulatory Visit: Payer: Self-pay | Admitting: Family Medicine

## 2020-11-05 DIAGNOSIS — M25374 Other instability, right foot: Secondary | ICD-10-CM | POA: Diagnosis not present

## 2020-11-05 DIAGNOSIS — R531 Weakness: Secondary | ICD-10-CM | POA: Diagnosis not present

## 2020-11-05 DIAGNOSIS — M25571 Pain in right ankle and joints of right foot: Secondary | ICD-10-CM | POA: Diagnosis not present

## 2020-11-05 DIAGNOSIS — R262 Difficulty in walking, not elsewhere classified: Secondary | ICD-10-CM | POA: Diagnosis not present

## 2020-11-05 DIAGNOSIS — M25674 Stiffness of right foot, not elsewhere classified: Secondary | ICD-10-CM | POA: Diagnosis not present

## 2020-11-11 DIAGNOSIS — M25374 Other instability, right foot: Secondary | ICD-10-CM | POA: Diagnosis not present

## 2020-11-11 DIAGNOSIS — G4733 Obstructive sleep apnea (adult) (pediatric): Secondary | ICD-10-CM | POA: Diagnosis not present

## 2020-11-11 DIAGNOSIS — R531 Weakness: Secondary | ICD-10-CM | POA: Diagnosis not present

## 2020-11-11 DIAGNOSIS — M25674 Stiffness of right foot, not elsewhere classified: Secondary | ICD-10-CM | POA: Diagnosis not present

## 2020-11-11 DIAGNOSIS — M25571 Pain in right ankle and joints of right foot: Secondary | ICD-10-CM | POA: Diagnosis not present

## 2020-11-11 DIAGNOSIS — R262 Difficulty in walking, not elsewhere classified: Secondary | ICD-10-CM | POA: Diagnosis not present

## 2020-11-12 DIAGNOSIS — M25571 Pain in right ankle and joints of right foot: Secondary | ICD-10-CM | POA: Diagnosis not present

## 2020-11-12 DIAGNOSIS — M25674 Stiffness of right foot, not elsewhere classified: Secondary | ICD-10-CM | POA: Diagnosis not present

## 2020-11-12 DIAGNOSIS — R262 Difficulty in walking, not elsewhere classified: Secondary | ICD-10-CM | POA: Diagnosis not present

## 2020-11-12 DIAGNOSIS — M25374 Other instability, right foot: Secondary | ICD-10-CM | POA: Diagnosis not present

## 2020-11-12 DIAGNOSIS — R531 Weakness: Secondary | ICD-10-CM | POA: Diagnosis not present

## 2020-11-15 ENCOUNTER — Other Ambulatory Visit: Payer: Self-pay

## 2020-11-15 ENCOUNTER — Telehealth: Payer: Self-pay

## 2020-11-15 MED ORDER — OMEPRAZOLE 20 MG PO CPDR: DELAYED_RELEASE_CAPSULE | ORAL | 3 refills | Status: DC

## 2020-11-15 NOTE — Telephone Encounter (Signed)
  LAST APPOINTMENT DATE: 08/08/2020   NEXT APPOINTMENT DATE:@Visit  date not found  MEDICATION:  omeprazole (PRILOSEC) 20 MG capsule  PHARMACY:[id number] - [place], [place] - [address] AT [facility] [address]   Comments: Patient has enough to last until Monday.

## 2020-11-15 NOTE — Telephone Encounter (Signed)
Sent to pharmacy 

## 2020-11-18 ENCOUNTER — Other Ambulatory Visit: Payer: Self-pay

## 2020-11-18 ENCOUNTER — Ambulatory Visit (INDEPENDENT_AMBULATORY_CARE_PROVIDER_SITE_OTHER): Payer: Medicare PPO | Admitting: Podiatry

## 2020-11-18 DIAGNOSIS — S86311D Strain of muscle(s) and tendon(s) of peroneal muscle group at lower leg level, right leg, subsequent encounter: Secondary | ICD-10-CM

## 2020-11-18 DIAGNOSIS — M76821 Posterior tibial tendinitis, right leg: Secondary | ICD-10-CM

## 2020-11-18 NOTE — Progress Notes (Signed)
  Subjective:  Patient ID: Brenda [name], female    DOB: 1940/12/27,  MRN: [id number]  Chief Complaint  Patient presents with   Plantar Fasciitis    Doing much better she is still doing physical therapy which has been helpful     80 y.o. female returns for follow-up with the above complaint. History confirmed with patient.  Doing much better she is nearly pain-free.  Estimates she is almost 1% better.  She still wears a brace occasionally.  Wants to start walking more and more  Objective:  Physical Exam: warm, good capillary refill, no trophic changes or ulcerative lesions, normal DP and PT pulses and normal sensory exam.  Right Foot: No pain in the peroneals, mild at insertion of the PT tendon and minimal on the plantar heel  Radiographs: X-ray of the right foot and ankle: no fracture, dislocation, swelling or degenerative changes noted there does appear some enthesopathy at the fifth metatarsal base   Study Result  Narrative & Impression  CLINICAL DATA:  Ankle pain posteriorly. Pain in the arch of the foot.   EXAM: MRI OF THE RIGHT ANKLE WITHOUT CONTRAST   TECHNIQUE: Multiplanar, multisequence MR imaging of the ankle was performed. No intravenous contrast was administered.   COMPARISON:  None.   FINDINGS: TENDONS   Peroneal: Peroneal longus tendon intact. Moderate tendinosis of the peroneus brevis with a longitudinal split tear.   Posteromedial: Mild tendinosis of the posterior tibial tendon. Flexor hallucis longus tendon intact. Flexor digitorum longus tendon intact.   Anterior: Tibialis anterior tendon intact. Extensor hallucis longus tendon intact Extensor digitorum longus tendon intact.   Achilles:  Intact.   Plantar Fascia: Small plantar calcaneal spur. Severe thickening and increased signal of the medial band of the plantar fascia at the calcaneal insertion consistent with severe plantar fasciitis with a partial-thickness tear.   LIGAMENTS    Lateral: Anterior talofibular ligament intact. Calcaneofibular ligament intact. Posterior talofibular ligament intact. Anterior and posterior tibiofibular ligaments intact.   Medial: Deltoid ligament intact. Spring ligament intact.   CARTILAGE   Ankle Joint: No joint effusion. Normal ankle mortise. No chondral defect.   Subtalar Joints/Sinus Tarsi: Normal subtalar joints. No subtalar joint effusion. Normal sinus tarsi.   Bones: No marrow signal abnormality.  No fracture or dislocation.   Soft Tissue: No fluid collection or hematoma. Muscles are normal without edema or atrophy. Tarsal tunnel is normal.   IMPRESSION: 1. Severe plantar fasciitis of the medial band of the plantar fascia at the calcaneal insertion with a partial thickness tear. 2. Moderate tendinosis of the peroneus brevis with a longitudinal split tear.     Electronically Signed   By: Elige Ko   On: 09/01/2020 10:55    Assessment:   1. Posterior tibial tendinitis of right lower extremity   2. Tear of peroneal tendon, right, subsequent encounter       Plan:  Patient was evaluated and treated and all questions answered.  Overall she is doing much better she is improved quite a bit after physical therapy.  I think she can begin to gradually increase her activity, I finished her therapy when she feels this is appropriate with her physical therapist and return to her regular walking activities.  Return to see me as needed if it returns and does not improve No follow-ups on file.

## 2020-11-21 DIAGNOSIS — R262 Difficulty in walking, not elsewhere classified: Secondary | ICD-10-CM | POA: Diagnosis not present

## 2020-11-21 DIAGNOSIS — M25571 Pain in right ankle and joints of right foot: Secondary | ICD-10-CM | POA: Diagnosis not present

## 2020-11-21 DIAGNOSIS — M25674 Stiffness of right foot, not elsewhere classified: Secondary | ICD-10-CM | POA: Diagnosis not present

## 2020-11-21 DIAGNOSIS — M25374 Other instability, right foot: Secondary | ICD-10-CM | POA: Diagnosis not present

## 2020-11-21 DIAGNOSIS — R531 Weakness: Secondary | ICD-10-CM | POA: Diagnosis not present

## 2020-11-21 NOTE — Progress Notes (Signed)
Carelink Summary Report / Loop Recorder 

## 2020-11-29 DIAGNOSIS — R531 Weakness: Secondary | ICD-10-CM | POA: Diagnosis not present

## 2020-11-29 DIAGNOSIS — R262 Difficulty in walking, not elsewhere classified: Secondary | ICD-10-CM | POA: Diagnosis not present

## 2020-11-29 DIAGNOSIS — M25374 Other instability, right foot: Secondary | ICD-10-CM | POA: Diagnosis not present

## 2020-11-29 DIAGNOSIS — M25674 Stiffness of right foot, not elsewhere classified: Secondary | ICD-10-CM | POA: Diagnosis not present

## 2020-11-29 DIAGNOSIS — M25571 Pain in right ankle and joints of right foot: Secondary | ICD-10-CM | POA: Diagnosis not present

## 2020-12-04 LAB — CUP PACEART REMOTE DEVICE CHECK
Date Time Interrogation Session: 20220802221146
Implantable Pulse Generator Implant Date: 20220218

## 2020-12-09 ENCOUNTER — Ambulatory Visit (INDEPENDENT_AMBULATORY_CARE_PROVIDER_SITE_OTHER): Payer: Medicare PPO

## 2020-12-09 DIAGNOSIS — I480 Paroxysmal atrial fibrillation: Secondary | ICD-10-CM | POA: Diagnosis not present

## 2020-12-22 NOTE — Progress Notes (Signed)
Electrophysiology Office Note Date: 12/24/2020  ID:  Brenda, [name] 07/19/1940, MRN [id number]  PCP: Ardith Dark, MD Primary Cardiologist: None Electrophysiologist: Hillis Range, MD   CC: ILR follow-up  Brenda [name] is a 79 y.o. female seen today for Dr. Johney Frame . she presents today for routine electrophysiology followup.  Since last being seen in our clinic, the patient reports doing very well. She has become slightly deconditioned after a R ankle injury, but is working on improvement.  she denies chest pain, palpitations, PND, orthopnea, nausea, vomiting, dizziness, syncope, edema, weight gain, or early satiety.  Device History: Medtronic loop recorder implanted 2018, exchanged for [facility] 06/21/20 for  AF/PACs/PAFs  Past Medical History:  Diagnosis Date   Atrial fibrillation [facility])    Diverticulosis    Foot fracture, left 01/2020   broken heel spur    GERD (gastroesophageal reflux disease)    Hypertension    OSA (obstructive sleep apnea)    severe   Paroxysmal A-fib (HCC)    s/p afib ablation at [facility]   Past Surgical History:  Procedure Laterality Date   ATRIAL FIBRILLATION ABLATION     [employer]   CATARACT EXTRACTION, BILATERAL  2020   CHOLECYSTECTOMY  1977   COLON RESECTION  2005   due to diverticulitis   COLONOSCOPY     Multiple   implantable loop recorder implant  06/21/2020   Medtronic Reveal Linq model LNQ 22 ([id number]) implantable loop recorder (with previously placed BSJG2 removed)   LOOP RECORDER INSERTION N/A 12/08/2016   Procedure: LOOP RECORDER INSERTION;  Surgeon: Hillis Range, MD;  Location: [facility] INVASIVE CV LAB;  Service: Cardiovascular;  Laterality: N/A;   TOTAL ABDOMINAL HYSTERECTOMY  1985    Current Outpatient Medications  Medication Sig Dispense Refill   aspirin 81 MG tablet Take 81 mg by mouth daily.     atorvastatin (LIPITOR) 40 MG tablet TAKE 1 TABLET(40 MG) BY MOUTH DAILY 90 tablet 3   CELEBREX 100 MG capsule Take 100 mg  by mouth 2 (two) times daily.     furosemide (LASIX) 20 MG tablet TAKE 1 TABLET(20 MG) BY MOUTH DAILY AS NEEDED FOR FLUID RETENTION 90 tablet 3   loratadine (CLARITIN) 10 MG tablet Take 10 mg by mouth daily as needed for allergies.      metoprolol succinate (TOPROL XL) 25 MG 24 hr tablet Take 1 tablet (25 mg total) by mouth daily. 90 tablet 3   minoxidil (ROGAINE) 2 % external solution Apply topically 2 (two) times daily.     omeprazole (PRILOSEC) 20 MG capsule TAKE 1 CAPSULE(20 MG) BY MOUTH DAILY 90 capsule 3   Vitamin D, Ergocalciferol, (DRISDOL) 1.25 MG (50000 UNIT) CAPS capsule Take 1 capsule (50,000 Units total) by mouth every 7 (seven) days. 12 capsule 0   No current facility-administered medications for this visit.    Allergies:   Patient has no known allergies.   Social History: Social History   Socioeconomic History   Marital status: Divorced    Spouse name: Not on file   Number of children: Not on file   Years of education: Not on file   Highest education level: Not on file  Occupational History   Occupation: retired   Tobacco Use   Smoking status: Former    Packs/day: 0.50    Years: 48.00    Pack years: 24.00    Types: Cigarettes    Start date: 1960    Quit date: 07/03/2006  Years since quitting: 14.4   Smokeless tobacco: Never  Vaping Use   Vaping Use: Never used  Substance and Sexual Activity   Alcohol use: Yes    Alcohol/week: 1.0 standard drink    Types: 1 Glasses of wine per week    Comment: occasion   Drug use: No   Sexual activity: Never  Other Topics Concern   Not on file  Social History Narrative   2018 moved to [place] from [place]   Retired Tourist information centre manager   3 daughters daughter in [place] is a physical therapist   2 cups of coffee a day no tobacco now though former smoker no drug use   Social Determinants of Corporate investment banker Strain: Low Risk    Difficulty of Paying Living Expenses: Not hard at all  Food Insecurity: No  Food Insecurity   Worried About Programme researcher, broadcasting/film/video in the Last Year: Never true   Barista in the Last Year: Never true  Transportation Needs: No Transportation Needs   Lack of Transportation (Medical): No   Lack of Transportation (Non-Medical): No  Physical Activity: Insufficiently Active   Days of Exercise per Week: 4 days   Minutes of Exercise per Session: 30 min  Stress: No Stress Concern Present   Feeling of Stress : Not at all  Social Connections: Moderately Isolated   Frequency of Communication with Friends and Family: More than three times a week   Frequency of Social Gatherings with Friends and Family: More than three times a week   Attends Religious Services: More than 4 times per year   Active Member of [employer] or Organizations: No   Attends Engineer, structural: Never   Marital Status: Divorced  Catering manager Violence: Not At Risk   Fear of Current or Ex-Partner: No   Emotionally Abused: No   Physically Abused: No   Sexually Abused: No    Family History: Family History  Problem Relation Age of Onset   Stroke Mother    Parkinson's disease Father    Colon cancer Neg Hx    Esophageal cancer Neg Hx    Pancreatic cancer Neg Hx    Stomach cancer Neg Hx    Liver disease Neg Hx      Review of Systems: All other systems reviewed and are otherwise negative except as noted above.  Physical Exam: Vitals:   12/24/20 0838  BP: 128/68  Pulse: 70  SpO2: 98%  Weight: 216 lb (98 kg)  Height: 5\' 4"  (1.626 m)     GEN- The patient is well appearing, alert and oriented x 3 today.   HEENT: normocephalic, atraumatic; sclera clear, conjunctiva pink; hearing intact; oropharynx clear; neck supple  Lungs- Clear to ausculation bilaterally, normal work of breathing.  No wheezes, rales, rhonchi Heart- Regular rate and rhythm, no murmurs, rubs or gallops  GI- soft, non-tender, non-distended, bowel sounds present  Extremities- no clubbing, cyanosis, or edema  MS-  no significant deformity or atrophy Skin- warm and dry, no rash or lesion; PPM pocket well healed Psych- euthymic mood, full affect Neuro- strength and sensation are intact  PPM Interrogation- reviewed via Carelink. No episodes.   EKG:  EKG is not ordered today.  Recent Labs: 08/28/2020: ALT 20; BUN 14; Creatinine, Ser 0.99; Hemoglobin 13.6; Platelets 185.0; Potassium 4.4; Sodium 140; TSH 4.40   Wt Readings from Last 3 Encounters:  12/24/20 216 lb (98 kg)  08/28/20 217 lb (98.4 kg)  06/21/20 218  lb 6.4 oz (99.1 kg)     Other studies Reviewed: Additional studies/ records that were reviewed today include: Echo 03/2020 shows LVEF 60-65%, Previous EP office notes, Previous remote checks, Most recent labwork.   Assessment and Plan:  1.  PAF/PACs  s/p Medtronic Loop recorder Normal device function by nightly transmissions No episodes via Carelink Check.   2. HTN Stable on current regimen  3. OSA Encouraged nightly CPAP  4. Ascending aneurysm (27mm) CT 03/08/2020 reviewed Repeat study in november  Current medicines are reviewed at length with the patient today.   The patient does not have concerns regarding her medicines.  The following changes were made today:  none  Labs/ tests ordered today include:  Orders Placed This Encounter  Procedures   CT ANGIO CHEST AORTA W/CM & OR WO/CM   Basic metabolic panel    Disposition:   Follow up with  AF clinic in 6 months, Dr. Johney Frame or EP APP in 12 months.     Dustin Flock, PA-C  12/24/2020 8:56 AM  [facility] [address] [place] [address] [phone] (office) [phone] (fax)

## 2020-12-24 ENCOUNTER — Other Ambulatory Visit: Payer: Self-pay

## 2020-12-24 ENCOUNTER — Ambulatory Visit (INDEPENDENT_AMBULATORY_CARE_PROVIDER_SITE_OTHER): Payer: Medicare PPO | Admitting: Student

## 2020-12-24 ENCOUNTER — Encounter: Payer: Self-pay | Admitting: Student

## 2020-12-24 VITALS — BP 128/68 | HR 70 | Ht 64.0 in | Wt 216.0 lb

## 2020-12-24 DIAGNOSIS — I10 Essential (primary) hypertension: Secondary | ICD-10-CM | POA: Diagnosis not present

## 2020-12-24 DIAGNOSIS — I480 Paroxysmal atrial fibrillation: Secondary | ICD-10-CM

## 2020-12-24 DIAGNOSIS — I712 Thoracic aortic aneurysm, without rupture: Secondary | ICD-10-CM

## 2020-12-24 DIAGNOSIS — G4733 Obstructive sleep apnea (adult) (pediatric): Secondary | ICD-10-CM

## 2020-12-24 DIAGNOSIS — R002 Palpitations: Secondary | ICD-10-CM

## 2020-12-24 DIAGNOSIS — I7121 Aneurysm of the ascending aorta, without rupture: Secondary | ICD-10-CM

## 2020-12-24 NOTE — Patient Instructions (Signed)
Medication Instructions:  Your physician recommends that you continue on your current medications as directed. Please refer to the Current Medication list given to you today.  *If you need a refill on your cardiac medications before your next appointment, please call your pharmacy*   Lab Work: CMET 1 week prior to CT Angio If you have labs (blood work) drawn today and your tests are completely normal, you will receive your results only by: MyChart Message (if you have MyChart) OR A paper copy in the mail If you have any lab test that is abnormal or we need to change your treatment, we will call you to review the results.   Testing/Procedures: Non-Cardiac CT Angiography (CTA), is a special type of CT scan that uses a computer to produce multi-dimensional views of major blood vessels throughout the body. In CT angiography, a contrast material is injected through an IV to help visualize the blood vessels    Follow-Up: At [facility], you and your health needs are our priority.  As part of our continuing mission to provide you with exceptional heart care, we have created designated Provider Care Teams.  These Care Teams include your primary Cardiologist (physician) and Advanced Practice Providers (APPs -  Physician Assistants and Nurse Practitioners) who all work together to provide you with the care you need, when you need it.  Your next appointment:   6 mth with [facility] 1 year with Hillis Range, MD

## 2021-01-01 NOTE — Progress Notes (Signed)
Carelink Summary Report / Loop Recorder 

## 2021-01-08 ENCOUNTER — Ambulatory Visit (INDEPENDENT_AMBULATORY_CARE_PROVIDER_SITE_OTHER): Payer: Medicare PPO | Admitting: Pulmonary Disease

## 2021-01-08 ENCOUNTER — Other Ambulatory Visit: Payer: Self-pay

## 2021-01-08 ENCOUNTER — Encounter: Payer: Self-pay | Admitting: Pulmonary Disease

## 2021-01-08 VITALS — BP 130/78 | HR 82 | Temp 97.4°F | Ht 64.0 in | Wt 213.0 lb

## 2021-01-08 DIAGNOSIS — Z9989 Dependence on other enabling machines and devices: Secondary | ICD-10-CM

## 2021-01-08 DIAGNOSIS — R0600 Dyspnea, unspecified: Secondary | ICD-10-CM | POA: Diagnosis not present

## 2021-01-08 DIAGNOSIS — R0609 Other forms of dyspnea: Secondary | ICD-10-CM

## 2021-01-08 DIAGNOSIS — G4733 Obstructive sleep apnea (adult) (pediatric): Secondary | ICD-10-CM

## 2021-01-08 NOTE — Progress Notes (Signed)
Brenda [name], Critical Care, and Sleep Medicine  Chief Complaint  Patient presents with   Shortness of Breath    Increased dyspnea with activity x6 months. Reports seen by her cardiologist 1-2 weeks ago and "okay".     Constitutional:  BP 130/78 (BP Location: Right Arm)   Pulse 82   Temp (!) 97.4 F (36.3 C)   Ht 5\' 4"  (1.626 m)   Wt 213 lb (96.6 kg)   SpO2 97%   BMI 36.56 kg/m   Past Medical History:  PAF, HTN, GERD, Diverticulosis  Past Surgical History:  She  has a past surgical history that includes Colon resection (2005); Cholecystectomy (1977); Total abdominal hysterectomy (1985); ATRIAL FIBRILLATION ABLATION; LOOP RECORDER INSERTION (N/A, 12/08/2016); Cataract extraction, bilateral (2020); Colonoscopy; and implantable loop recorder implant (06/21/2020).  Brief Summary:  Brenda [name] is a 80 y.o. female with obstructive sleep apnea and dyspnea.      Subjective:   Last saw her in 2020.  She gets winded when bending over.  She also feels short of breath when walking up an incline, or when she is carrying something heavy.  Not having cough, wheeze, sputum, chest pain.  She injured her ankle, and hasn't been able to keep up with her exercise as usual.  She uses CPAP nightly.  No issues with mask fit.  Her machine is more than 78 yrs old.  Physical Exam:   Appearance - well kempt   ENMT - no sinus tenderness, no oral exudate, no LAN, Mallampati 3 airway, no stridor  Respiratory - equal breath sounds bilaterally, no wheezing or rales  CV - s1s2 regular rate and rhythm, no murmurs  Ext - no clubbing, no edema  Skin - no rashes  Psych - normal mood and affect   [name] testing:  PFT 03/03/18 >> FEV1 2.54 (107%), FEV1% 81, TLC 5.46 (104%), DLCO 84%, no BD  Chest Imaging:  CT angio chest 03/08/20 >> ascending aorta 42 mm, RLL nodule 0.5 cm stable since 2020, previous granulomatous infection  Sleep Tests:  HST 04/14/16 >> AHI 39.1, SpO2 low  67% Auto 12/09/20 to 01/07/21 >> used on 30 of 30 nights with average 8 hrs 18 min.  Average AHI 0.9 with median CPAP 14 and 95 th percentile CPAP 17 cm H2O  Cardiac Tests:  Coronary CT 03/22/18 >> atherosclerosis, 4.3 cm ascending aorta Echo 03/06/20 >> EF 60 to 65%, grade 1 DD, aortic root 40 mm  Social History:  She  reports that she quit smoking about 14 years ago. Her smoking use included cigarettes. She started smoking about 62 years ago. She has a 24.00 pack-year smoking history. She has never used smokeless tobacco. She reports current alcohol use of about 1.0 standard drink per week. She reports that she does not use drugs.  Family History:  Her family history includes Parkinson's disease in her father; Stroke in her mother.     Assessment/Plan:   Dyspnea on exertion. - mostly likely from obesity, deconditioning, diastolic dysfunction - encouraged her to maintain a regular exercise regimen with cardiovascular and strength training   Obstructive sleep apnea. - she is compliant with CPAP and reports benefit - she uses [employer] for her DME - her current machine is more than 78 yrs old - will arrange for new Resmed 11 auto CPAP with pressure range 5 to 18 cm H2O   CPAP rhinitis. - reviewed cleaning techniques for equipment - continue claritin  Paroxysmal atrial fibrillation, Ascending aortic aneurysym. -  followed by Dr. Hillis Range with [facility]  Time Spent Involved in Patient Care on Day of Examination:  31 minutes  Follow up:   Patient Instructions  Will arrange for new Resmed auto CPAP through [employer]  Follow up in 6 months  Medication List:   Allergies as of 01/08/2021   No Known Allergies      Medication List        Accurate as of January 08, 2021  9:56 AM. If you have any questions, ask your nurse or doctor.          aspirin 81 MG tablet Take 81 mg by mouth daily.   atorvastatin 40 MG tablet Commonly known as: LIPITOR TAKE 1 TABLET(40 MG) BY  MOUTH DAILY   CeleBREX 100 MG capsule Generic drug: celecoxib Take 100 mg by mouth 2 (two) times daily.   furosemide 20 MG tablet Commonly known as: LASIX TAKE 1 TABLET(20 MG) BY MOUTH DAILY AS NEEDED FOR FLUID RETENTION   loratadine 10 MG tablet Commonly known as: CLARITIN Take 10 mg by mouth daily as needed for allergies.   metoprolol succinate 25 MG 24 hr tablet Commonly known as: Toprol XL Take 1 tablet (25 mg total) by mouth daily.   minoxidil 2 % external solution Commonly known as: ROGAINE Apply topically 2 (two) times daily.   omeprazole 20 MG capsule Commonly known as: PRILOSEC TAKE 1 CAPSULE(20 MG) BY MOUTH DAILY   Vitamin D (Ergocalciferol) 1.25 MG (50000 UNIT) Caps capsule Commonly known as: DRISDOL Take 1 capsule (50,000 Units total) by mouth every 7 (seven) days.        Signature:  [name], MD [facility] [name]/Critical Care Pager - [phone] 01/08/2021, 9:56 AM

## 2021-01-08 NOTE — Patient Instructions (Signed)
Will arrange for new Resmed auto CPAP through [employer]  Follow up in 6 months

## 2021-01-13 ENCOUNTER — Ambulatory Visit: Payer: Medicare PPO

## 2021-01-13 DIAGNOSIS — I480 Paroxysmal atrial fibrillation: Secondary | ICD-10-CM

## 2021-01-15 LAB — CUP PACEART REMOTE DEVICE CHECK
Date Time Interrogation Session: 20220904221156
Implantable Pulse Generator Implant Date: 20220218

## 2021-01-21 NOTE — Progress Notes (Signed)
Carelink Summary Report / Loop Recorder 

## 2021-02-17 ENCOUNTER — Ambulatory Visit (INDEPENDENT_AMBULATORY_CARE_PROVIDER_SITE_OTHER): Payer: Medicare PPO

## 2021-02-17 DIAGNOSIS — I480 Paroxysmal atrial fibrillation: Secondary | ICD-10-CM

## 2021-02-18 LAB — CUP PACEART REMOTE DEVICE CHECK
Date Time Interrogation Session: 20221007221229
Implantable Pulse Generator Implant Date: 20220218

## 2021-02-24 ENCOUNTER — Ambulatory Visit: Payer: Medicare PPO | Admitting: Family Medicine

## 2021-02-24 ENCOUNTER — Other Ambulatory Visit: Payer: Medicare PPO

## 2021-02-24 ENCOUNTER — Encounter: Payer: Self-pay | Admitting: Family Medicine

## 2021-02-24 ENCOUNTER — Other Ambulatory Visit: Payer: Self-pay

## 2021-02-24 DIAGNOSIS — M79671 Pain in right foot: Secondary | ICD-10-CM | POA: Insufficient documentation

## 2021-02-24 DIAGNOSIS — I10 Essential (primary) hypertension: Secondary | ICD-10-CM | POA: Diagnosis not present

## 2021-02-24 NOTE — Progress Notes (Signed)
   Brenda [name] is a 80 y.o. female who presents today for an office visit.  Assessment/Plan:  Chronic Problems Addressed Today: Right foot pain Pain is still not controlled.  Seems to be slowly improving though still has days with significant pain.  She is taking Celebrex as needed.  We will continue with conservative management for now.  If symptoms do not continue to improve then we will consider referral to sports medicine for second opinion before she pursues surgery.  Hypertension Alcohol metoprolol succinate 25 mg daily per cardiology.     Subjective:  HPI:  See A/p for status of chronic conditions.   She is having issues with right foot pain. 13 months ago started having pain in her right foot. Went to podiatrist 6 months ago.  Initially went to orthopedics.  Diagnosed with plantar fasciitis. Had an MRI that showed that showed severe plantar fasciitis and peroneus brevis tendinosis. Symptoms wax and wane. She did several months of physical.        Objective:  Physical Exam: BP 121/74   Pulse 78   Temp 98 F (36.7 C) (Temporal)   Ht 5\' 4"  (1.626 [name])   Wt 215 lb 12.8 oz (97.9 kg)   SpO2 96%   BMI 37.04 kg/[name]   Gen: No acute distress, resting comfortably CV: Regular rate and rhythm with no murmurs appreciated Pulm: Normal work of breathing, clear to auscultation bilaterally with no crackles, wheezes, or rhonchi Neuro: Grossly normal, moves all extremities Psych: Normal affect and thought content      Brenda [name]. , MD 02/24/2021 1:25 PM

## 2021-02-24 NOTE — Assessment & Plan Note (Signed)
Pain is still not controlled.  Seems to be slowly improving though still has days with significant pain.  She is taking Celebrex as needed.  We will continue with conservative management for now.  If symptoms do not continue to improve then we will consider referral to sports medicine for second opinion before she pursues surgery.

## 2021-02-24 NOTE — Assessment & Plan Note (Signed)
Alcohol metoprolol succinate 25 mg daily per cardiology.

## 2021-02-24 NOTE — Patient Instructions (Signed)
It was very nice to see you today!  Please let me know how your foot is doing in another few months.  No other medication changes today.  We will fill out your handicap placard form today.  We will see back in about 6 months for your annual physical.  Please come back to see me sooner if needed.  Take care, Dr Jimmey Ralph  PLEASE NOTE:  If you had any lab tests please let us know if you have not heard back within a few days. You may see your results on mychart before we have a chance to review them but we will give you a call once they are reviewed by [place]. If we ordered any referrals today, please let us know if you have not heard from their office within the next week.   Please try these tips to maintain a healthy lifestyle:  Eat at least 3 REAL meals and 1-2 snacks per day.  Aim for no more than 5 hours between eating.  If you eat breakfast, please do so within one hour of getting up.   Each meal should contain half fruits/vegetables, one quarter protein, and one quarter carbs (no bigger than a computer mouse)  Cut down on sweet beverages. This includes juice, soda, and sweet tea.   Drink at least 1 glass of water with each meal and aim for at least 8 glasses per day  Exercise at least 150 minutes every week.

## 2021-02-26 ENCOUNTER — Other Ambulatory Visit: Payer: Medicare PPO | Admitting: *Deleted

## 2021-02-26 ENCOUNTER — Other Ambulatory Visit: Payer: Self-pay

## 2021-02-26 DIAGNOSIS — I10 Essential (primary) hypertension: Secondary | ICD-10-CM | POA: Diagnosis not present

## 2021-02-26 DIAGNOSIS — R002 Palpitations: Secondary | ICD-10-CM

## 2021-02-26 DIAGNOSIS — I480 Paroxysmal atrial fibrillation: Secondary | ICD-10-CM | POA: Diagnosis not present

## 2021-02-26 DIAGNOSIS — G4733 Obstructive sleep apnea (adult) (pediatric): Secondary | ICD-10-CM

## 2021-02-26 DIAGNOSIS — I7121 Aneurysm of the ascending aorta, without rupture: Secondary | ICD-10-CM

## 2021-02-26 DIAGNOSIS — R739 Hyperglycemia, unspecified: Secondary | ICD-10-CM | POA: Diagnosis not present

## 2021-02-26 LAB — BASIC METABOLIC PANEL
BUN/Creatinine Ratio: 13 (ref 12–28)
BUN: 12 mg/dL (ref 8–27)
CO2: 27 mmol/L (ref 20–29)
Calcium: 9.1 mg/dL (ref 8.7–10.3)
Chloride: 103 mmol/L (ref 96–106)
Creatinine, Ser: 0.89 mg/dL (ref 0.57–1.00)
Glucose: 84 mg/dL (ref 70–99)
Potassium: 4.5 mmol/L (ref 3.5–5.2)
Sodium: 142 mmol/L (ref 134–144)
eGFR: 66 mL/min/{1.73_m2} (ref >59)

## 2021-02-26 NOTE — Progress Notes (Signed)
Carelink Summary Report / Loop Recorder 

## 2021-02-28 ENCOUNTER — Other Ambulatory Visit: Payer: Medicare PPO

## 2021-03-01 ENCOUNTER — Other Ambulatory Visit: Payer: Self-pay | Admitting: Internal Medicine

## 2021-03-03 ENCOUNTER — Ambulatory Visit (INDEPENDENT_AMBULATORY_CARE_PROVIDER_SITE_OTHER)
Admission: RE | Admit: 2021-03-03 | Discharge: 2021-03-03 | Disposition: A | Discharge status: Home / Self Care | Payer: Medicare PPO | Source: Ambulatory Visit | Attending: Student | Admitting: Student

## 2021-03-03 ENCOUNTER — Other Ambulatory Visit: Payer: Self-pay

## 2021-03-03 DIAGNOSIS — I7121 Aneurysm of the ascending aorta, without rupture: Secondary | ICD-10-CM | POA: Diagnosis not present

## 2021-03-03 DIAGNOSIS — I700 Atherosclerosis of aorta: Secondary | ICD-10-CM | POA: Diagnosis not present

## 2021-03-03 DIAGNOSIS — I719 Aortic aneurysm of unspecified site, without rupture: Secondary | ICD-10-CM

## 2021-03-03 DIAGNOSIS — K449 Diaphragmatic hernia without obstruction or gangrene: Secondary | ICD-10-CM | POA: Diagnosis not present

## 2021-03-03 MED ORDER — IOHEXOL 350 MG/ML SOLN
100.0000 mL | Freq: Once | INTRAVENOUS | Status: AC | PRN
  Administered 2021-03-03: 100 mL via INTRAVENOUS

## 2021-03-07 ENCOUNTER — Ambulatory Visit: Payer: Medicare PPO

## 2021-03-07 ENCOUNTER — Encounter: Payer: Self-pay | Admitting: Family Medicine

## 2021-03-07 ENCOUNTER — Ambulatory Visit
Admission: RE | Admit: 2021-03-07 | Discharge: 2021-03-07 | Disposition: A | Discharge status: Home / Self Care | Payer: Medicare PPO | Source: Ambulatory Visit | Attending: Family Medicine | Admitting: Family Medicine

## 2021-03-07 ENCOUNTER — Other Ambulatory Visit: Payer: Self-pay

## 2021-03-07 DIAGNOSIS — Z0001 Encounter for general adult medical examination with abnormal findings: Secondary | ICD-10-CM

## 2021-03-07 DIAGNOSIS — E2839 Other primary ovarian failure: Secondary | ICD-10-CM

## 2021-03-07 DIAGNOSIS — M85851 Other specified disorders of bone density and structure, right thigh: Secondary | ICD-10-CM | POA: Diagnosis not present

## 2021-03-07 DIAGNOSIS — M85832 Other specified disorders of bone density and structure, left forearm: Secondary | ICD-10-CM | POA: Diagnosis not present

## 2021-03-07 DIAGNOSIS — M8580 Other specified disorders of bone density and structure, unspecified site: Secondary | ICD-10-CM | POA: Insufficient documentation

## 2021-03-07 NOTE — Progress Notes (Signed)
Please inform patient of the following:  Her bone density scan shows that she has osteopenia.  This is thinning of the bones but not osteoporosis.  She should optimize her calcium and vitamin D intake.  She should be getting at least 1200 mg of calcium daily and 800 international units of vitamin D daily.  We should repeat bone density scan in 2 years.

## 2021-03-10 ENCOUNTER — Telehealth: Payer: Self-pay

## 2021-03-10 NOTE — Telephone Encounter (Signed)
Patient aware to take OTC vitamins

## 2021-03-10 NOTE — Telephone Encounter (Signed)
Patient calling about her bone density results. Patient would like to know if Dr. Jimmey Ralph is going to prescribe the medication or if she is just to get over the counter. Please advise.

## 2021-03-24 ENCOUNTER — Ambulatory Visit (INDEPENDENT_AMBULATORY_CARE_PROVIDER_SITE_OTHER): Payer: Medicare PPO

## 2021-03-24 DIAGNOSIS — I480 Paroxysmal atrial fibrillation: Secondary | ICD-10-CM

## 2021-03-25 LAB — CUP PACEART REMOTE DEVICE CHECK
Date Time Interrogation Session: 20221109220801
Implantable Pulse Generator Implant Date: 20220218

## 2021-04-02 NOTE — Progress Notes (Signed)
Carelink Summary Report / Loop Recorder 

## 2021-04-15 ENCOUNTER — Ambulatory Visit (INDEPENDENT_AMBULATORY_CARE_PROVIDER_SITE_OTHER): Payer: Medicare PPO

## 2021-04-15 DIAGNOSIS — I480 Paroxysmal atrial fibrillation: Secondary | ICD-10-CM

## 2021-04-15 LAB — CUP PACEART REMOTE DEVICE CHECK
Date Time Interrogation Session: 20221212220710
Implantable Pulse Generator Implant Date: 20220218

## 2021-04-25 NOTE — Progress Notes (Signed)
Carelink Summary Report / Loop Recorder 

## 2021-05-19 ENCOUNTER — Ambulatory Visit (INDEPENDENT_AMBULATORY_CARE_PROVIDER_SITE_OTHER): Payer: Medicare PPO

## 2021-05-19 DIAGNOSIS — I480 Paroxysmal atrial fibrillation: Secondary | ICD-10-CM

## 2021-05-19 LAB — CUP PACEART REMOTE DEVICE CHECK
Date Time Interrogation Session: 20230115230338
Implantable Pulse Generator Implant Date: 20220218

## 2021-05-20 DIAGNOSIS — G4733 Obstructive sleep apnea (adult) (pediatric): Secondary | ICD-10-CM | POA: Diagnosis not present

## 2021-05-29 NOTE — Progress Notes (Signed)
Carelink Summary Report / Loop Recorder 

## 2021-06-18 ENCOUNTER — Telehealth: Payer: Self-pay

## 2021-06-18 NOTE — Telephone Encounter (Signed)
Please have her schedule an appointment.  Brenda [name]. Jerline Pain, MD 06/18/2021 11:34 AM

## 2021-06-18 NOTE — Telephone Encounter (Signed)
Appt sch for 2/17 at 9:20am

## 2021-06-18 NOTE — Telephone Encounter (Signed)
Patient cannot make it in today or tomorrow. She has a cough and fever. Does not feel well. I offered virtual visit and she does not know how to but her daughter may be able to help.  But not sure if she can. Patient would like some cough meds called in.

## 2021-06-18 NOTE — Telephone Encounter (Signed)
Please see note.

## 2021-06-18 NOTE — Telephone Encounter (Signed)
Please schedule appointment with PCP.

## 2021-06-20 ENCOUNTER — Other Ambulatory Visit: Payer: Self-pay

## 2021-06-20 ENCOUNTER — Ambulatory Visit (INDEPENDENT_AMBULATORY_CARE_PROVIDER_SITE_OTHER): Payer: Medicare PPO | Admitting: Family Medicine

## 2021-06-20 ENCOUNTER — Encounter: Payer: Self-pay | Admitting: Family Medicine

## 2021-06-20 VITALS — BP 92/66 | HR 60 | Temp 97.2°F | Ht 64.0 in | Wt 210.8 lb

## 2021-06-20 DIAGNOSIS — I480 Paroxysmal atrial fibrillation: Secondary | ICD-10-CM

## 2021-06-20 DIAGNOSIS — G4483 Primary cough headache: Secondary | ICD-10-CM

## 2021-06-20 DIAGNOSIS — U071 COVID-19: Secondary | ICD-10-CM | POA: Diagnosis not present

## 2021-06-20 DIAGNOSIS — I10 Essential (primary) hypertension: Secondary | ICD-10-CM | POA: Diagnosis not present

## 2021-06-20 LAB — POC INFLUENZA A&B (BINAX/QUICKVUE)
Influenza A, POC: NEGATIVE
Influenza B, POC: NEGATIVE

## 2021-06-20 LAB — POC COVID19 BINAXNOW: SARS Coronavirus 2 Ag: POSITIVE — AB

## 2021-06-20 MED ORDER — MOLNUPIRAVIR 200 MG PO CAPS: 4.0000 | ORAL_CAPSULE | Freq: Two times a day (BID) | ORAL | 0 refills | Status: AC

## 2021-06-20 NOTE — Patient Instructions (Signed)
It was very nice to see you today!  You have COVID.  I will send in a prescription for molnupiravir to your pharmacy.  You can wait another day before starting.  Please make sure that you are getting plenty of fluids and staying hydrated.  Let us know if your symptoms are not improving.  Take care, Dr Jimmey Ralph  PLEASE NOTE:  If you had any lab tests please let us know if you have not heard back within a few days. You may see your results on mychart before we have a chance to review them but we will give you a call once they are reviewed by [place]. If we ordered any referrals today, please let us know if you have not heard from their office within the next week.   Please try these tips to maintain a healthy lifestyle:  Eat at least 3 REAL meals and 1-2 snacks per day.  Aim for no more than 5 hours between eating.  If you eat breakfast, please do so within one hour of getting up.   Each meal should contain half fruits/vegetables, one quarter protein, and one quarter carbs (no bigger than a computer mouse)  Cut down on sweet beverages. This includes juice, soda, and sweet tea.   Drink at least 1 glass of water with each meal and aim for at least 8 glasses per day  Exercise at least 150 minutes every week.

## 2021-06-20 NOTE — Assessment & Plan Note (Signed)
Follows with cardiology.  On the low side today though typically well controlled.  We will continue metoprolol succinate 25 mg daily.

## 2021-06-20 NOTE — Progress Notes (Signed)
° °  Vonda Rizo is a 81 y.o. female who presents today for an office visit.  Assessment/Plan:  New/Acute Problems: COVID No red flags.  COVID test positive.  She is improving and would like to hold off on antibiotics at this time.  I think this is reasonable.  We will send in pocket prescription for molnupiravir if her symptoms do not continue to improve over the next couple of days.  She is still within the treatment window.  We discussed isolation guidelines.  She continue over-the-counter meds.  Discussed reasons to return to care.  Chronic Problems Addressed Today: Hypertension Follows with cardiology.  On the low side today though typically well controlled.  We will continue metoprolol succinate 25 mg daily.    Paroxysmal A-fib (HCC) Sinus rhythm.  Rate controlled on metoprolol succinate 25 mg daily.     Subjective:  HPI:  Symptoms started about 3 days ago. Symptoms include cough, fever, soreness. Tried several OTC medications which helped modestly.  No known sick contacts. Symptoms seem to be slowly improving. She has a lot of malaise and fatigue.        Objective:  Physical Exam: BP 92/66 (BP Location: Right Arm)    Pulse 60    Temp (!) 97.2 F (36.2 C) (Temporal)    Ht 5\' 4"  (1.626 m)    Wt 210 lb 12.8 oz (95.6 kg)    SpO2 96%    BMI 36.18 kg/m   Gen: No acute distress, resting comfortably CV: Regular rate and rhythm with no murmurs appreciated Pulm: Normal work of breathing, clear to auscultation bilaterally with no crackles, wheezes, or rhonchi Neuro: Grossly normal, moves all extremities Psych: Normal affect and thought content      Matilde Markie M. Jerline Pain, MD 06/20/2021 9:57 AM

## 2021-06-20 NOTE — Assessment & Plan Note (Signed)
Sinus rhythm.  Rate controlled on metoprolol succinate 25 mg daily.

## 2021-06-21 LAB — CUP PACEART REMOTE DEVICE CHECK
Date Time Interrogation Session: 20230217230341
Implantable Pulse Generator Implant Date: 20220218

## 2021-06-23 ENCOUNTER — Ambulatory Visit (INDEPENDENT_AMBULATORY_CARE_PROVIDER_SITE_OTHER): Payer: Medicare PPO

## 2021-06-23 DIAGNOSIS — I480 Paroxysmal atrial fibrillation: Secondary | ICD-10-CM | POA: Diagnosis not present

## 2021-06-26 ENCOUNTER — Ambulatory Visit (HOSPITAL_COMMUNITY): Payer: Medicare PPO | Admitting: Nurse Practitioner

## 2021-06-27 NOTE — Progress Notes (Signed)
Carelink Summary Report / Loop Recorder 

## 2021-06-29 ENCOUNTER — Other Ambulatory Visit: Payer: Self-pay | Admitting: Family Medicine

## 2021-07-03 ENCOUNTER — Other Ambulatory Visit: Payer: Self-pay

## 2021-07-03 ENCOUNTER — Ambulatory Visit (HOSPITAL_COMMUNITY)
Admission: RE | Admit: 2021-07-03 | Discharge: 2021-07-03 | Disposition: A | Discharge status: Home / Self Care | Payer: Medicare PPO | Source: Ambulatory Visit | Attending: Nurse Practitioner | Admitting: Nurse Practitioner

## 2021-07-03 ENCOUNTER — Encounter (HOSPITAL_COMMUNITY): Payer: Self-pay | Admitting: Nurse Practitioner

## 2021-07-03 VITALS — BP 126/68 | HR 55 | Ht 64.0 in | Wt 208.6 lb

## 2021-07-03 DIAGNOSIS — I480 Paroxysmal atrial fibrillation: Secondary | ICD-10-CM | POA: Insufficient documentation

## 2021-07-03 NOTE — Progress Notes (Signed)
? ?Primary Care Physician: Vivi Barrack, MD ?Referring Physician: Dr. Rayann Heman ? ? ?Brenda [name] is a 81 y.o. female with a h/o paroxysmal afib with a Linq for surveillance that is here for f/u.  She reports that she is doing well.She did have 2.5 hours of afib in May 2020 that she was unaware of. Her afib burden is low at 0.1 %. She has a CHA2DS2VASc score of 4 and is not on anticoagulation. She has deferred in the past unless her burden significantly increases by Linq. ? ?In for f/u 05/30/19. She feels well,no issues with afib. Last linq in December showing no arrhythmia, as well as for the last 6 months.  ? ?F/u in afib clinic, 05/29/20, she was seen by Dr. Rayann Heman last September and device showed a lot of PAC's and afib burden remained very low. Pt is still deferring DOAC unless her afib burden significantly increases. Her Toprol was increased to 25 mg daily by Dr. Rayann Heman. Using cpap. EKG today shows SR. Her Linq is now with dead battery. The issue is that she is not on DOAC and the Linq was helping to mitigate her risk to watch for afib not on DOAC. She is on Celebrex daily but would prefer not to take DOAC if possible so that she can continue to take her NSAIDs. She is interested to replace Linq.  ? ?F/u in afib clinic,07/03/21. Linq reports have not shown any afib and pt continues off anticoagulation as she wanted to be able to take Celebrex for arthritis.  She had covid 2 weeks ago and a few palpitations at that time. Otherwise, no concerns.  ? ?Today, she denies symptoms of palpitations, chest pain, shortness of breath, orthopnea, PND, lower extremity edema, dizziness, presyncope, syncope, or neurologic sequela. The patient is tolerating medications without difficulties and is otherwise without complaint today.  ? ?Past Medical History:  ?Diagnosis Date  ? Atrial fibrillation ([place])   ? Diverticulosis   ? Foot fracture, left 01/2020  ? broken heel spur   ? GERD (gastroesophageal reflux disease)   ?  Hypertension   ? OSA (obstructive sleep apnea)   ? severe  ? Paroxysmal A-fib [facility])   ? s/p afib ablation at [facility]  ? ?Past Surgical History:  ?Procedure Laterality Date  ? ATRIAL FIBRILLATION ABLATION    ? [employer]  ? CATARACT EXTRACTION, BILATERAL  2020  ? CHOLECYSTECTOMY  1977  ? COLON RESECTION  2005  ? due to diverticulitis  ? COLONOSCOPY    ? Multiple  ? implantable loop recorder implant  06/21/2020  ? Medtronic Reveal Linq model LNQ 22 (RLB [id number]) implantable loop recorder (with previously placed LINQ1 removed)  ? LOOP RECORDER INSERTION N/A 12/08/2016  ? Procedure: LOOP RECORDER INSERTION;  Surgeon: Thompson Grayer, MD;  Location: [place] CV LAB;  Service: Cardiovascular;  Laterality: N/A;  ? TOTAL ABDOMINAL HYSTERECTOMY  1985  ? ? ?Current Outpatient Medications  ?Medication Sig Dispense Refill  ? aspirin 81 MG tablet Take 81 mg by mouth daily.    ? atorvastatin (LIPITOR) 40 MG tablet TAKE 1 TABLET(40 MG) BY MOUTH DAILY 90 tablet 3  ? CELEBREX 100 MG capsule Take 100 mg by mouth 2 (two) times daily.    ? furosemide (LASIX) 20 MG tablet TAKE 1 TABLET(20 MG) BY MOUTH DAILY AS NEEDED FOR FLUID RETENTION 90 tablet 3  ? loratadine (CLARITIN) 10 MG tablet Take 10 mg by mouth daily as needed for allergies.    ? metoprolol  succinate (TOPROL-XL) 25 MG 24 hr tablet TAKE 1 TABLET(25 MG) BY MOUTH DAILY 90 tablet 3  ? minoxidil (ROGAINE) 2 % external solution Apply topically 2 (two) times daily.    ? omeprazole (PRILOSEC) 20 MG capsule TAKE 1 CAPSULE(20 MG) BY MOUTH DAILY 90 capsule 3  ? Vitamin D, Ergocalciferol, (DRISDOL) 1.25 MG (50000 UNIT) CAPS capsule Take 1 capsule (50,000 Units total) by mouth every 7 (seven) days. 12 capsule 0  ? ?No current facility-administered medications for this encounter.  ? ? ?No Known Allergies ? ?Social History  ? ?Socioeconomic History  ? Marital status: Divorced  ?  Spouse name: Not on file  ? Number of children: Not on file  ? Years of education: Not on file  ? Highest  education level: Not on file  ?Occupational History  ? Occupation: retired   ?Tobacco Use  ? Smoking status: Former  ?  Packs/day: 0.50  ?  Years: 48.00  ?  Pack years: 24.00  ?  Types: Cigarettes  ?  Start date: 58  ?  Quit date: 07/03/2006  ?  Years since quitting: 15.0  ? Smokeless tobacco: Never  ?Vaping Use  ? Vaping Use: Never used  ?Substance and Sexual Activity  ? Alcohol use: Yes  ?  Alcohol/week: 1.0 standard drink  ?  Types: 1 Glasses of wine per week  ?  Comment: occasion  ? Drug use: No  ? Sexual activity: Never  ?Other Topics Concern  ? Not on file  ?Social History Narrative  ? 2018 moved to [place] from [place]  ? Retired Automotive engineer  ? 3 daughters daughter in [place] is a physical therapist  ? 2 cups of coffee a day no tobacco now though former smoker no drug use  ? ?Social Determinants of Health  ? ?Financial Resource Strain: Low Risk   ? Difficulty of Paying Living Expenses: Not hard at all  ?Food Insecurity: No Food Insecurity  ? Worried About Charity fundraiser in the Last Year: Never true  ? Ran Out of Food in the Last Year: Never true  ?Transportation Needs: No Transportation Needs  ? Lack of Transportation (Medical): No  ? Lack of Transportation (Non-Medical): No  ?Physical Activity: Insufficiently Active  ? Days of Exercise per Week: 4 days  ? Minutes of Exercise per Session: 30 min  ?Stress: No Stress Concern Present  ? Feeling of Stress : Not at all  ?Social Connections: Moderately Isolated  ? Frequency of Communication with Friends and Family: More than three times a week  ? Frequency of Social Gatherings with Friends and Family: More than three times a week  ? Attends Religious Services: More than 4 times per year  ? Active Member of Clubs or Organizations: No  ? Attends Archivist Meetings: Never  ? Marital Status: Divorced  ?Intimate Partner Violence: Not At Risk  ? Fear of Current or Ex-Partner: No  ? Emotionally Abused: No  ? Physically Abused: No  ? Sexually  Abused: No  ? ? ?Family History  ?Problem Relation Age of Onset  ? Stroke Mother   ? Parkinson's disease Father   ? Colon cancer Neg Hx   ? Esophageal cancer Neg Hx   ? Pancreatic cancer Neg Hx   ? Stomach cancer Neg Hx   ? Liver disease Neg Hx   ? ? ?ROS- All systems are reviewed and negative except as per the HPI above ? ?Physical Exam: ?Vitals:  ? 07/03/21 0902  ?  BP: 126/68  ?Pulse: (!) 55  ?Weight: 94.6 kg  ?Height: 5\' 4"  (1.626 m)  ? ?Wt Readings from Last 3 Encounters:  ?07/03/21 94.6 kg  ?06/20/21 95.6 kg  ?02/24/21 97.9 kg  ? ? ?Labs: ?Lab Results  ?Component Value Date  ? NA 142 02/26/2021  ? K 4.5 02/26/2021  ? CL 103 02/26/2021  ? CO2 27 02/26/2021  ? GLUCOSE 84 02/26/2021  ? BUN 12 02/26/2021  ? CREATININE 0.89 02/26/2021  ? CALCIUM 9.1 02/26/2021  ? ?No results found for: INR ?Lab Results  ?Component Value Date  ? CHOL 139 08/28/2020  ? HDL 59.90 08/28/2020  ? [place] 50 08/28/2020  ? TRIG 142.0 08/28/2020  ? ? ? ?GEN- The patient is well appearing, alert and oriented x 3 today.   ?Head- normocephalic, atraumatic ?Eyes-  Sclera clear, conjunctiva pink ?Ears- hearing intact ?Oropharynx- clear ?Neck- supple, no JVP ?Lymph- no cervical lymphadenopathy ?Lungs- Clear to ausculation bilaterally, normal work of breathing ?Heart- slow regular rate and rhythm, no murmurs, rubs or gallops, PMI not laterally displaced ?GI- soft, NT, ND, + BS ?Extremities- no clubbing, cyanosis, or edema ?MS- no significant deformity or atrophy ?Skin- no rash or lesion ?Psych- euthymic mood, full affect ?Neuro- strength and sensation are intact ? ?EKG-sinus brady at 55 bpm, pr int 208 ms, qrs int 86 ms, qtc 417 ms  ?linq report reviewed from February ? ? ?Assessment and Plan: ?1. Paroxysmal afib ?S/p ablation in 2019 ?No significant afib burden seen with Linq ?Continue BB without change ? ?2. CHA2DS2VASc score of 4 ?Pt wishes to stay off anticoagulation so is being monitored with Linq to determine if afib does occur, then she would  be willing to start anticoagulation   ? ?F/u here in one year ? ?Geroge Baseman Kayleen Memos, ANP-C ?Afib Clinic ?[facility] ?[address] ?[place], [place] [address] ?[phone] ? ?

## 2021-07-11 ENCOUNTER — Ambulatory Visit: Payer: Medicare PPO

## 2021-07-14 IMAGING — MG DIGITAL SCREENING BILATERAL MAMMOGRAM WITH TOMO AND CAD
6 of 10 series · 6 of 30 positions shown · non-contrast
Comparison: Previous exam(s).

CLINICAL DATA: Screening.

EXAM:
DIGITAL SCREENING BILATERAL MAMMOGRAM WITH TOMO AND CAD

[L MLO synth-2D (1 of 2)]
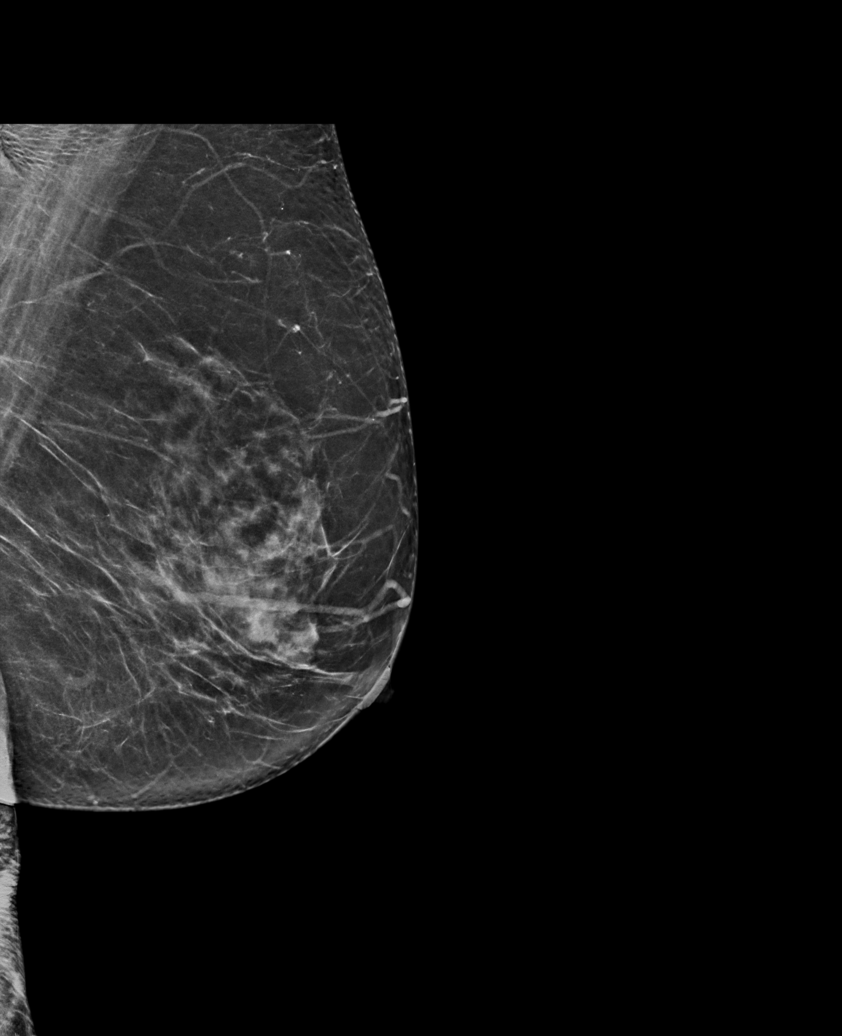

[L MLO synth-2D (2 of 2)]
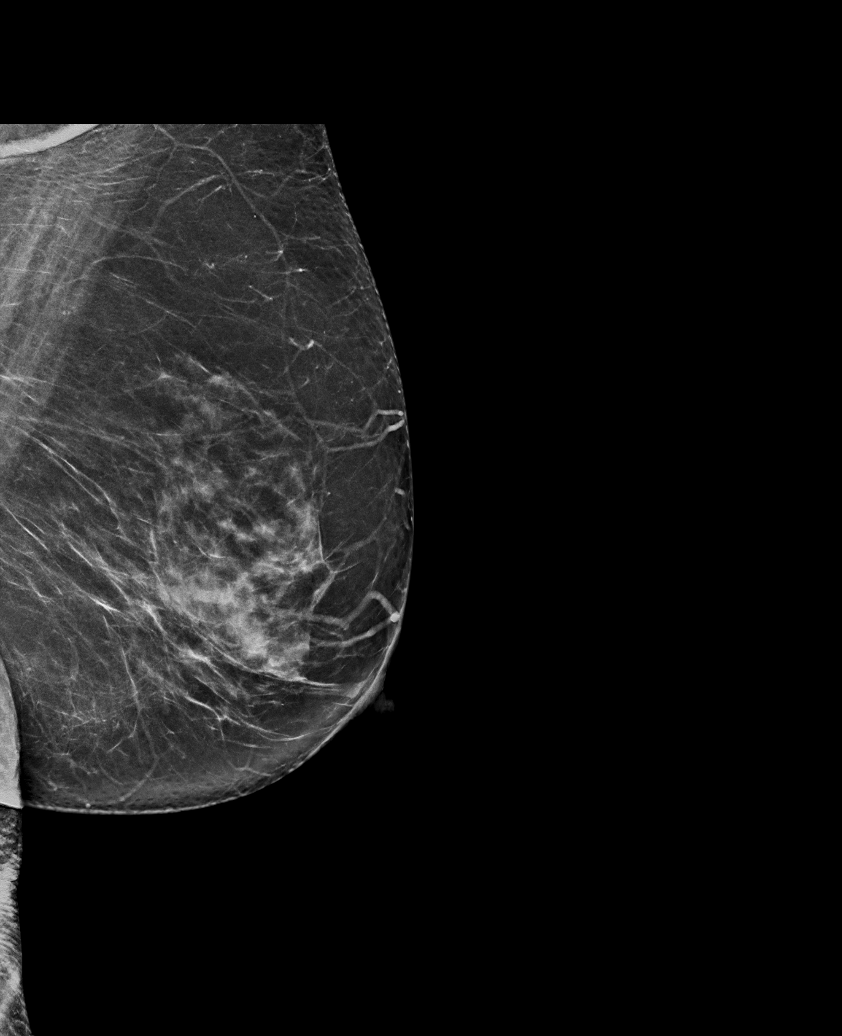

[R MLO synth-2D]
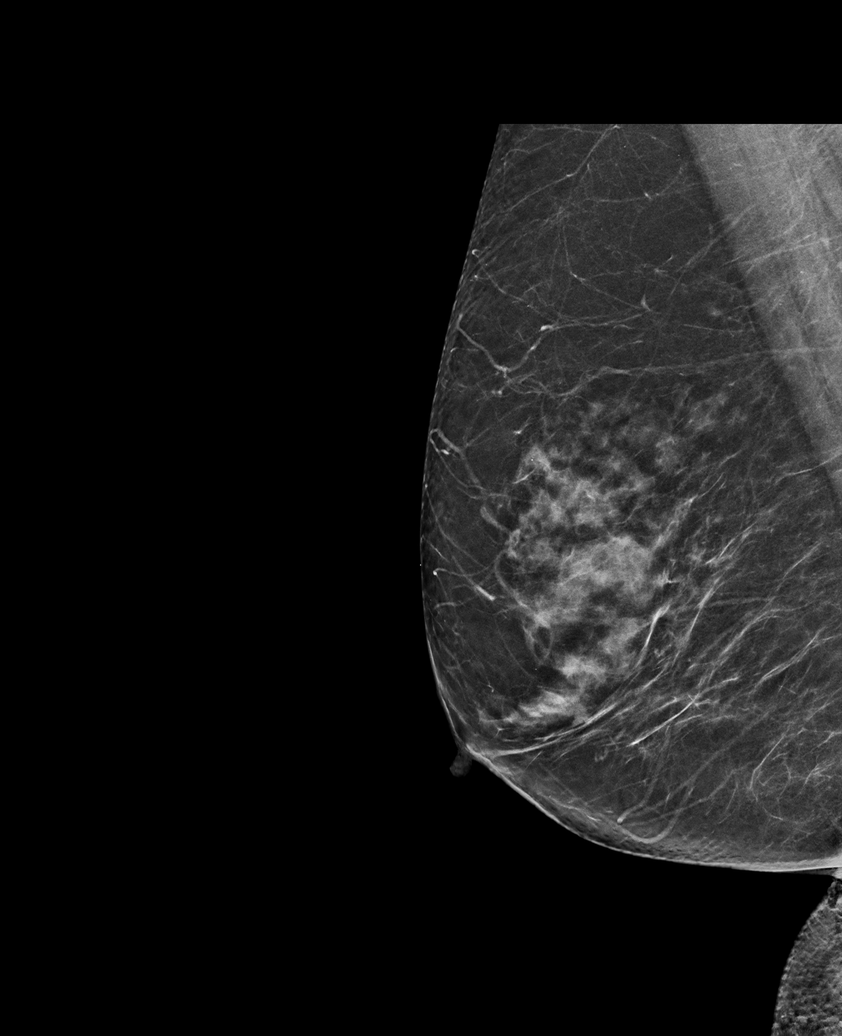

[R CC synth-2D]
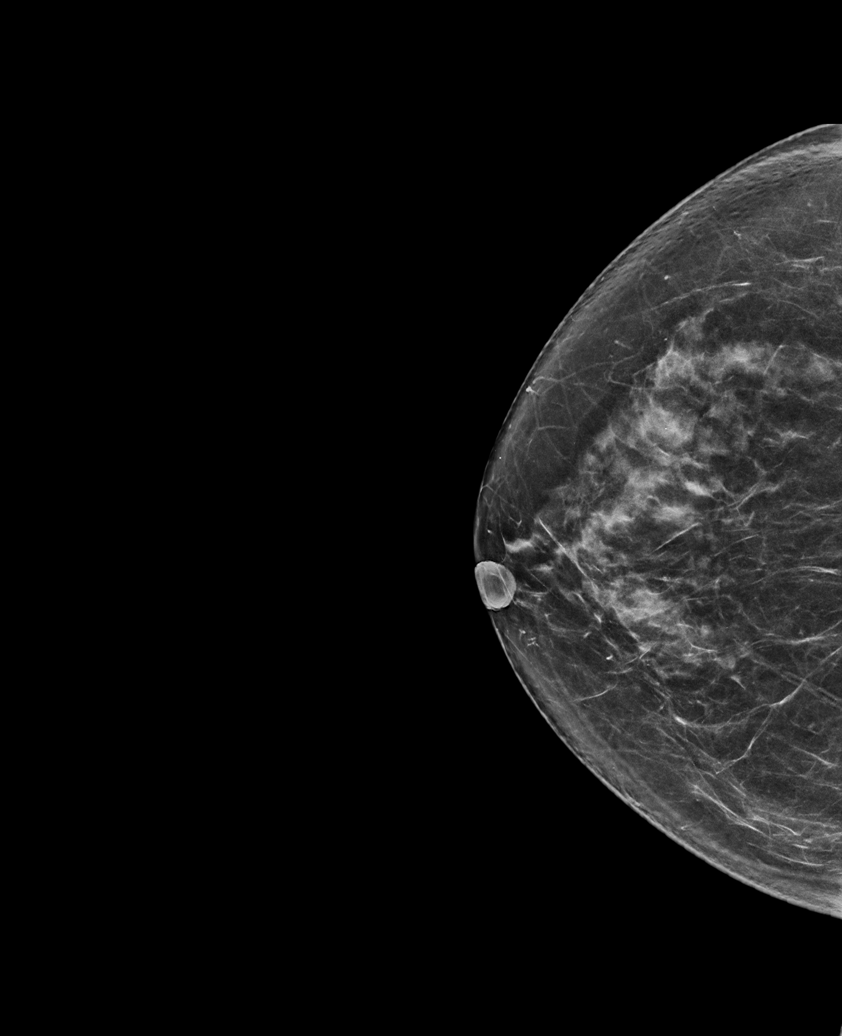

[L CC synth-2D]
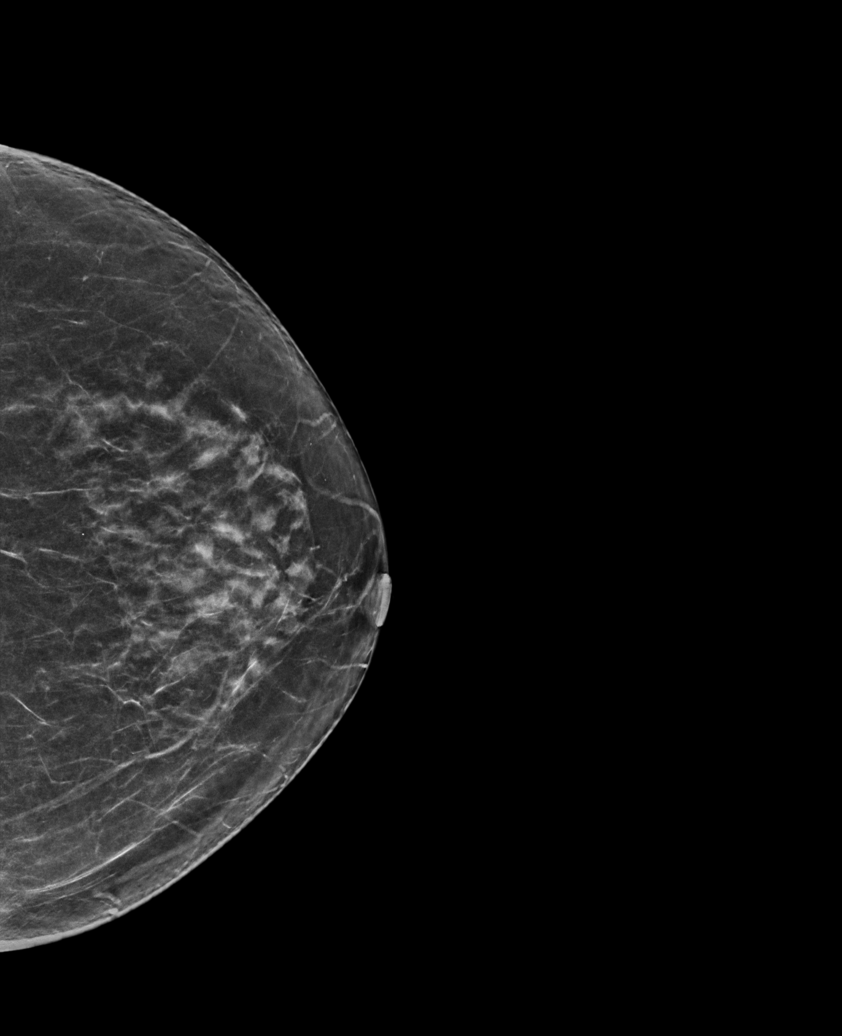

[R CC tomo · tomo slice 38/75.0]
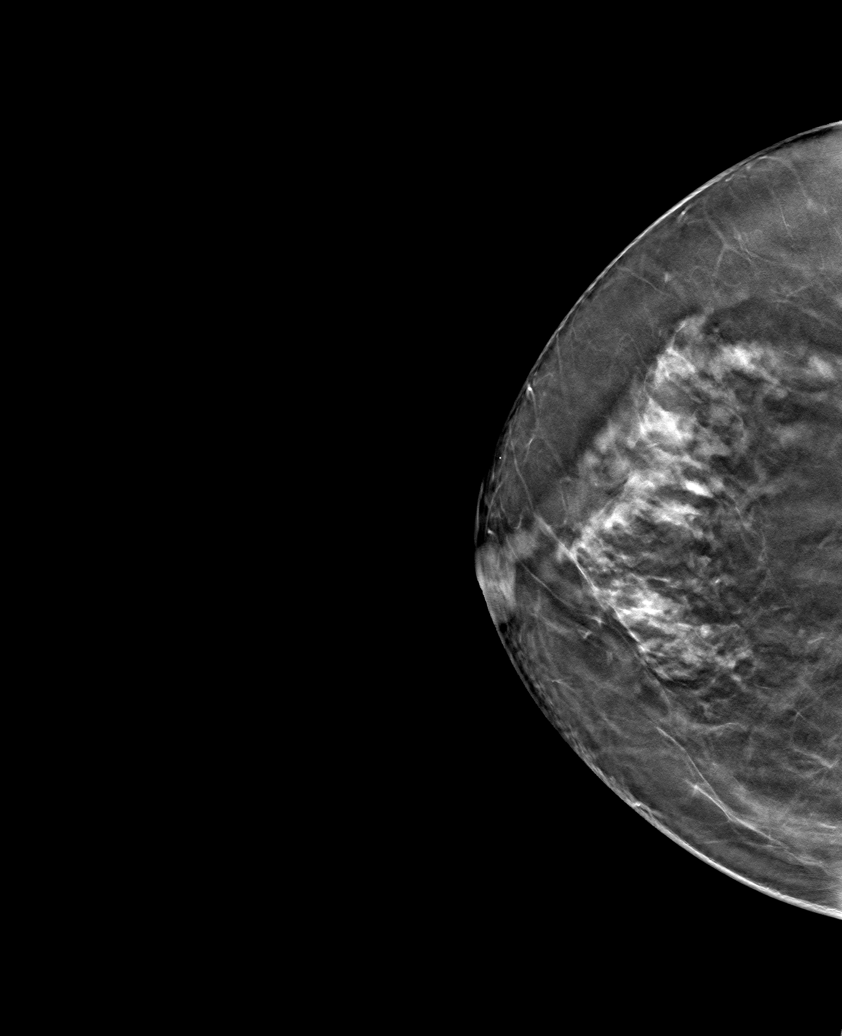

[6 of 30 positions shown; findings below may reference images not displayed]

ACR Breast Density Category c: The breast tissue is heterogeneously
dense, which may obscure small masses.
FINDINGS: There are no findings suspicious for malignancy. Images were
processed with CAD.
IMPRESSION: No mammographic evidence of malignancy. A result letter of this
screening mammogram will be mailed directly to the patient.

RECOMMENDATION:
Screening mammogram in one year. (Code:FT-U-LHB)

BI-RADS CATEGORY  1: Negative.

## 2021-07-28 ENCOUNTER — Ambulatory Visit (INDEPENDENT_AMBULATORY_CARE_PROVIDER_SITE_OTHER): Payer: Medicare PPO

## 2021-07-28 DIAGNOSIS — I480 Paroxysmal atrial fibrillation: Secondary | ICD-10-CM

## 2021-07-29 ENCOUNTER — Telehealth: Payer: Self-pay | Admitting: Family Medicine

## 2021-07-29 LAB — CUP PACEART REMOTE DEVICE CHECK
Date Time Interrogation Session: 20230326231112
Implantable Pulse Generator Implant Date: 20220218

## 2021-07-29 NOTE — Telephone Encounter (Signed)
Copied from [place] [phone]. Topic: Medicare AWV ?>> Jul 29, 2021  2:30 PM Harris-Coley, Hannah Beat wrote: ?Reason for CRM: Left message for patient to schedule Annual Wellness Visit.  Please schedule with Nurse Health Advisor Charlott Rakes, RN at [facility].  Please call [phone] ask for Juliann Pulse ?

## 2021-08-07 NOTE — Progress Notes (Signed)
Carelink Summary Report / Loop Recorder 

## 2021-08-31 LAB — CUP PACEART REMOTE DEVICE CHECK
Date Time Interrogation Session: 20230428230322
Implantable Pulse Generator Implant Date: 20220218

## 2021-09-01 ENCOUNTER — Ambulatory Visit (INDEPENDENT_AMBULATORY_CARE_PROVIDER_SITE_OTHER): Payer: Medicare PPO

## 2021-09-01 DIAGNOSIS — I480 Paroxysmal atrial fibrillation: Secondary | ICD-10-CM

## 2021-09-16 NOTE — Progress Notes (Signed)
Carelink Summary Report / Loop Recorder 

## 2021-10-06 ENCOUNTER — Ambulatory Visit (INDEPENDENT_AMBULATORY_CARE_PROVIDER_SITE_OTHER): Payer: Medicare PPO

## 2021-10-06 ENCOUNTER — Telehealth: Payer: Self-pay | Admitting: Family Medicine

## 2021-10-06 DIAGNOSIS — I480 Paroxysmal atrial fibrillation: Secondary | ICD-10-CM

## 2021-10-06 LAB — CUP PACEART REMOTE DEVICE CHECK
Date Time Interrogation Session: 20230531230644
Implantable Pulse Generator Implant Date: 20220218

## 2021-10-06 NOTE — Telephone Encounter (Signed)
Copied from CRM [id number]. Topic: Medicare AWV >> Oct 06, 2021  2:04 PM Harris-Coley, [place] Gully wrote: Reason for CRM: Left message for patient to schedule Annual Wellness Visit.  Please schedule with Nurse Health Advisor Denisa O'Brien-Blaney, LPN at [facility].  Please call [phone] ask for [facility]

## 2021-10-06 NOTE — Telephone Encounter (Signed)
Copied from CRM [id number]. Topic: Medicare AWV >> Oct 06, 2021  2:12 PM Harris-Coley, [place] Gully wrote: Reason for CRM: Left message for patient to schedule Annual Wellness Visit.  Please schedule with Nurse Health Advisor Lanier Ensign, RN at [facility].  Please call [phone] ask for Oleg[facility] Messier  Please disregard previous message to schedule w/Denisa at [facility] in error)

## 2021-10-09 ENCOUNTER — Ambulatory Visit: Payer: Medicare PPO

## 2021-10-13 ENCOUNTER — Ambulatory Visit (INDEPENDENT_AMBULATORY_CARE_PROVIDER_SITE_OTHER): Payer: Medicare PPO

## 2021-10-13 VITALS — BP 118/60 | HR 68 | Temp 98.2°F | Wt 202.2 lb

## 2021-10-13 DIAGNOSIS — Z0000 Encounter for general adult medical examination without abnormal findings: Secondary | ICD-10-CM | POA: Diagnosis not present

## 2021-10-13 NOTE — Patient Instructions (Signed)
[name] , Thank you for taking time to come for your Medicare Wellness Visit. I appreciate your ongoing commitment to your health goals. Please review the following plan we discussed and let me know if I can assist you in the future.   Screening recommendations/referrals: Colonoscopy: Completed 12/11/19  Mammogram: No longer required  Bone Density: Done 03/07/21 repeat every 2 years  Recommended yearly ophthalmology/optometry visit for glaucoma screening and checkup Recommended yearly dental visit for hygiene and checkup  Vaccinations: Influenza vaccine: Done 02/10/21 repeat every year  Pneumococcal vaccine: Up to date Tdap vaccine: Done 01/09/18 repeat every 10 years  Shingles vaccine: discontinued    Covid-19:completed 1/15, 2/5, & 03/29/20  Advanced directives: Please bring a copy of your health care power of attorney and living will to the office at your convenience.  Conditions/risks identified: none at this time   Next appointment: Follow up in one year for your annual wellness visit    Preventive Care 65 Years and Older, Female Preventive care refers to lifestyle choices and visits with your health care provider that can promote health and wellness. What does preventive care include? A yearly physical exam. This is also called an annual well check. Dental exams once or twice a year. Routine eye exams. Ask your health care provider how often you should have your eyes checked. Personal lifestyle choices, including: Daily care of your teeth and gums. Regular physical activity. Eating a healthy diet. Avoiding tobacco and drug use. Limiting alcohol use. Practicing safe sex. Taking low-dose aspirin every day. Taking vitamin and mineral supplements as recommended by your health care provider. What happens during an annual well check? The services and screenings done by your health care provider during your annual well check will depend on your age, overall health, lifestyle risk  factors, and family history of disease. Counseling  Your health care provider may ask you questions about your: Alcohol use. Tobacco use. Drug use. Emotional well-being. Home and relationship well-being. Sexual activity. Eating habits. History of falls. Memory and ability to understand (cognition). Work and work Astronomer. Reproductive health. Screening  You may have the following tests or measurements: Height, weight, and BMI. Blood pressure. Lipid and cholesterol levels. These may be checked every 5 years, or more frequently if you are over 8 years old. Skin check. Lung cancer screening. You may have this screening every year starting at age 59 if you have a 30-pack-year history of smoking and currently smoke or have quit within the past 15 years. Fecal occult blood test (FOBT) of the stool. You may have this test every year starting at age 35. Flexible sigmoidoscopy or colonoscopy. You may have a sigmoidoscopy every 5 years or a colonoscopy every 10 years starting at age 26. Hepatitis C blood test. Hepatitis B blood test. Sexually transmitted disease (STD) testing. Diabetes screening. This is done by checking your blood sugar (glucose) after you have not eaten for a while (fasting). You may have this done every 1-3 years. Bone density scan. This is done to screen for osteoporosis. You may have this done starting at age 38. Mammogram. This may be done every 1-2 years. Talk to your health care provider about how often you should have regular mammograms. Talk with your health care provider about your test results, treatment options, and if necessary, the need for more tests. Vaccines  Your health care provider may recommend certain vaccines, such as: Influenza vaccine. This is recommended every year. Tetanus, diphtheria, and acellular pertussis (Tdap, Td) vaccine. You  may need a Td booster every 10 years. Zoster vaccine. You may need this after age 75. Pneumococcal 13-valent  conjugate (PCV13) vaccine. One dose is recommended after age 79. Pneumococcal polysaccharide (PPSV23) vaccine. One dose is recommended after age 49. Talk to your health care provider about which screenings and vaccines you need and how often you need them. This information is not intended to replace advice given to you by your health care provider. Make sure you discuss any questions you have with your health care provider. Document Released: 05/17/2015 Document Revised: 01/08/2016 Document Reviewed: 02/19/2015 Elsevier Interactive Patient Education  2017 [place] Prevention in the Home Falls can cause injuries. They can happen to people of all ages. There are many things you can do to make your home safe and to help prevent falls. What can I do on the outside of my home? Regularly fix the edges of walkways and driveways and fix any cracks. Remove anything that might make you trip as you walk through a door, such as a raised step or threshold. Trim any bushes or trees on the path to your home. Use bright outdoor lighting. Clear any walking paths of anything that might make someone trip, such as rocks or tools. Regularly check to see if handrails are loose or broken. Make sure that both sides of any steps have handrails. Any raised decks and porches should have guardrails on the edges. Have any leaves, snow, or ice cleared regularly. Use sand or salt on walking paths during winter. Clean up any spills in your garage right away. This includes oil or grease spills. What can I do in the bathroom? Use night lights. Install grab bars by the toilet and in the tub and shower. Do not use towel bars as grab bars. Use non-skid mats or decals in the tub or shower. If you need to sit down in the shower, use a plastic, non-slip stool. Keep the floor dry. Clean up any water that spills on the floor as soon as it happens. Remove soap buildup in the tub or shower regularly. Attach bath mats  securely with double-sided non-slip rug tape. Do not have throw rugs and other things on the floor that can make you trip. What can I do in the bedroom? Use night lights. Make sure that you have a light by your bed that is easy to reach. Do not use any sheets or blankets that are too big for your bed. They should not hang down onto the floor. Have a firm chair that has side arms. You can use this for support while you get dressed. Do not have throw rugs and other things on the floor that can make you trip. What can I do in the kitchen? Clean up any spills right away. Avoid walking on wet floors. Keep items that you use a lot in easy-to-reach places. If you need to reach something above you, use a strong step stool that has a grab bar. Keep electrical cords out of the way. Do not use floor polish or wax that makes floors slippery. If you must use wax, use non-skid floor wax. Do not have throw rugs and other things on the floor that can make you trip. What can I do with my stairs? Do not leave any items on the stairs. Make sure that there are handrails on both sides of the stairs and use them. Fix handrails that are broken or loose. Make sure that handrails are as long as the  stairways. Check any carpeting to make sure that it is firmly attached to the stairs. Fix any carpet that is loose or worn. Avoid having throw rugs at the top or bottom of the stairs. If you do have throw rugs, attach them to the floor with carpet tape. Make sure that you have a light switch at the top of the stairs and the bottom of the stairs. If you do not have them, ask someone to add them for you. What else can I do to help prevent falls? Wear shoes that: Do not have high heels. Have rubber bottoms. Are comfortable and fit you well. Are closed at the toe. Do not wear sandals. If you use a stepladder: Make sure that it is fully opened. Do not climb a closed stepladder. Make sure that both sides of the stepladder  are locked into place. Ask someone to hold it for you, if possible. Clearly mark and make sure that you can see: Any grab bars or handrails. First and last steps. Where the edge of each step is. Use tools that help you move around (mobility aids) if they are needed. These include: Canes. Walkers. Scooters. Crutches. Turn on the lights when you go into a dark area. Replace any light bulbs as soon as they burn out. Set up your furniture so you have a clear path. Avoid moving your furniture around. If any of your floors are uneven, fix them. If there are any pets around you, be aware of where they are. Review your medicines with your doctor. Some medicines can make you feel dizzy. This can increase your chance of falling. Ask your doctor what other things that you can do to help prevent falls. This information is not intended to replace advice given to you by your health care provider. Make sure you discuss any questions you have with your health care provider. Document Released: 02/14/2009 Document Revised: 09/26/2015 Document Reviewed: 05/25/2014 Elsevier Interactive Patient Education  2017 [employer].

## 2021-10-13 NOTE — Progress Notes (Signed)
Subjective:   Brenda [name] is a 81 y.o. female who presents for Medicare Annual (Subsequent) preventive examination.  Review of Systems     Cardiac Risk Factors include: advanced age (>57men, >61 women);obesity (BMI >30kg/m2);hypertension;dyslipidemia     Objective:    Today's Vitals   10/13/21 0831  BP: 118/60  Pulse: 68  Temp: 98.2 F (36.8 C)  SpO2: 96%  Weight: 202 lb 3.2 oz (91.7 kg)   Body mass index is 34.71 kg/m.     10/13/2021    8:40 AM 07/05/2020    8:09 AM 06/02/2019   12:26 PM 08/24/2018    9:58 AM 12/08/2016    8:31 AM  Advanced Directives  Does Patient Have a Medical Advance Directive? Yes Yes Yes Yes Yes  Type of Advance Directive Living will Healthcare Power of [place];Living will Person;Living will Living will  Does patient want to make changes to medical advance directive?   No - Patient declined  No - Patient declined  Copy of [place] in Chart?  No - copy requested No - copy requested      Current Medications (verified) Outpatient Encounter Medications as of 10/13/2021  Medication Sig   aspirin 81 MG tablet Take 81 mg by mouth daily.   atorvastatin (LIPITOR) 40 MG tablet TAKE 1 TABLET(40 MG) BY MOUTH DAILY   CELEBREX 100 MG capsule Take 100 mg by mouth 2 (two) times daily.   furosemide (LASIX) 20 MG tablet TAKE 1 TABLET(20 MG) BY MOUTH DAILY AS NEEDED FOR FLUID RETENTION   loratadine (CLARITIN) 10 MG tablet Take 10 mg by mouth daily as needed for allergies.   metoprolol succinate (TOPROL-XL) 25 MG 24 hr tablet TAKE 1 TABLET(25 MG) BY MOUTH DAILY   minoxidil (ROGAINE) 2 % external solution Apply topically 2 (two) times daily.   omeprazole (PRILOSEC) 20 MG capsule TAKE 1 CAPSULE(20 MG) BY MOUTH DAILY   Vitamin D, Ergocalciferol, (DRISDOL) 1.25 MG (50000 UNIT) CAPS capsule Take 1 capsule (50,000 Units total) by mouth every 7 (seven) days. (Patient not taking: Reported on 10/13/2021)    No facility-administered encounter medications on file as of 10/13/2021.    Allergies (verified) Patient has no known allergies.   History: Past Medical History:  Diagnosis Date   Atrial fibrillation ([place])    Diverticulosis    Foot fracture, left 01/2020   broken heel spur    GERD (gastroesophageal reflux disease)    Hypertension    OSA (obstructive sleep apnea)    severe   Paroxysmal A-fib ([place])    s/p afib ablation at [facility]   Past Surgical History:  Procedure Laterality Date   [place] RESECTION  2005   due to diverticulitis   COLONOSCOPY     Multiple   implantable loop recorder implant  06/21/2020   Medtronic Reveal Linq model LNQ 22 (RLB [id number]) implantable loop recorder (with previously placed [id number] removed)   LOOP RECORDER INSERTION N/A 12/08/2016   Procedure: LOOP RECORDER INSERTION;  Surgeon: Thompson Grayer, MD;  Location: [facility];  Service: Cardiovascular;  Laterality: N/A;   TOTAL ABDOMINAL HYSTERECTOMY  1985   Family History  Problem Relation Age of Onset   Stroke Mother    Parkinson's disease Father    Colon cancer Neg Hx    Esophageal cancer Neg Hx  Pancreatic cancer Neg Hx    Stomach cancer Neg Hx    Liver disease Neg Hx    Social History   Socioeconomic History   Marital status: Divorced    Spouse name: Not on file   Number of children: Not on file   Years of education: Not on file   Highest education level: Not on file  Occupational History   Occupation: retired   Tobacco Use   Smoking status: Former    Packs/day: 0.50    Years: 48.00    Total pack years: 24.00    Types: Cigarettes    Start date: 62    Quit date: 07/03/2006    Years since quitting: 15.2   Smokeless tobacco: Never  Vaping Use   Vaping Use: Never used  Substance and Sexual Activity   Alcohol use: Yes    Alcohol/week: 1.0 standard drink of  alcohol    Types: 1 Glasses of wine per week    Comment: occasion   Drug use: No   Sexual activity: Never  Other Topics Concern   Not on file  Social History Narrative   2018 moved to [place] from [place]   Retired Automotive engineer   3 daughters daughter in [place] is a physical therapist   2 cups of coffee a day no tobacco now though former smoker no drug use   Social Determinants of Radio broadcast assistant Strain: Low Risk  (10/13/2021)   Overall Financial Resource Strain (CARDIA)    Difficulty of Paying Living Expenses: Not hard at all  Food Insecurity: No Food Insecurity (10/13/2021)   Hunger Vital Sign    Worried About Running Out of Food in the Last Year: Never true    [place] in the Last Year: Never true  Transportation Needs: No Transportation Needs (10/13/2021)   PRAPARE - Hydrologist (Medical): No    Lack of Transportation (Non-Medical): No  Physical Activity: Sufficiently Active (10/13/2021)   Exercise Vital Sign    Days of Exercise per Week: 5 days    Minutes of Exercise per Session: 30 min  Stress: No Stress Concern Present (10/13/2021)   [place]    Feeling of Stress : Not at all  Social Connections: Moderately Isolated (10/13/2021)   Social Connection and Isolation Panel [NHANES]    Frequency of Communication with Friends and Family: More than three times a week    Frequency of Social Gatherings with Friends and Family: More than three times a week    Attends Religious Services: More than 4 times per year    Active Member of [employer] or Organizations: No    Attends Music therapist: Never    Marital Status: Divorced    Tobacco Counseling Counseling given: Not Answered   Clinical Intake:  Pre-visit preparation completed: Yes  Pain : No/denies pain     BMI - recorded: 34.71 Nutritional Status: BMI > 30  Obese Nutritional  Risks: None Diabetes: No  How often do you need to have someone help you when you read instructions, pamphlets, or other written materials from your doctor or pharmacy?: 1 - Never  Diabetic?no  Interpreter Needed?: No  Information entered by :: Charlott Rakes, LPN   Activities of Daily Living    10/13/2021    8:42 AM  In your present state of health, do you have any difficulty performing the following activities:  Hearing? 0  Vision? 0  Difficulty concentrating or making decisions? 0  Walking or climbing stairs? 0  Dressing or bathing? 0  Doing errands, shopping? 0  Preparing Food and eating ? N  Using the Toilet? N  In the past six months, have you accidently leaked urine? N  Do you have problems with loss of bowel control? N  Managing your Medications? N  Managing your Finances? N  Housekeeping or managing your Housekeeping? N    Patient Care Team: Vivi Barrack, MD as PCP - General (Family Medicine) Thompson Grayer, MD as PCP - Electrophysiology (Cardiology) Thompson Grayer, MD as Consulting Physician (Cardiology) Chesley Mires, MD as Consulting Physician (Pulmonary Disease) Stephannie Li, [place] as Consulting Physician (Ophthalmology) Hiram Gash, MD as Consulting Physician (Orthopedic Surgery)  Indicate any recent Medical Services you may have received from other than [facility] providers in the past year (date may be approximate).     Assessment:   This is a routine wellness examination for [place].  Hearing/Vision screen Hearing Screening - Comments:: Denies any hearing issues  Vision Screening - Comments:: Pt follows up with Dr Alesia Banda for annual eye exams   Dietary issues and exercise activities discussed: Current Exercise Habits: Home exercise routine, Type of exercise: walking, Time (Minutes): 30, Frequency (Times/Week): 5, Weekly Exercise (Minutes/Week): 150   Goals Addressed             This Visit's Progress    Patient Stated       None at this time         Depression Screen    10/13/2021    8:39 AM 02/24/2021   12:50 PM 07/05/2020    8:08 AM 06/02/2019   12:27 PM 10/24/2018    9:02 AM 07/06/2017    1:35 PM 01/06/2017    9:51 AM  PHQ 2/9 Scores  PHQ - 2 Score 0 0 0 0 0 0 0  PHQ- 9 Score       1    Fall Risk    10/13/2021    8:42 AM 07/05/2020    8:10 AM 06/02/2019   12:27 PM 10/24/2018    9:02 AM 07/06/2017    1:35 PM  Fall Risk   Falls in the past year? 1 0 0  No  Number falls in past yr: 1 0 0 0   Injury with Fall? 1 0 0 1   Comment fell on the side walk with bruises   wrist injury   Risk for fall due to : Impaired vision      Follow up Falls prevention discussed Falls prevention discussed Falls evaluation completed;Education provided;Falls prevention discussed      FALL RISK PREVENTION PERTAINING TO THE HOME:  Any stairs in or around the home? No  If so, are there any without handrails? No  Home free of loose throw rugs in walkways, pet beds, electrical cords, etc? Yes  Adequate lighting in your home to reduce risk of falls? Yes   ASSISTIVE DEVICES UTILIZED TO PREVENT FALLS:  Life alert? No  Use of a cane, walker or w/c? Yes  Grab bars in the bathroom? Yes  Shower chair or bench in shower? No  Elevated toilet seat or a handicapped toilet? Yes   TIMED UP AND GO:  Was the test performed? Yes .  Length of time to ambulate 10 feet: 10 sec.   Gait steady and fast without use of assistive device  Cognitive Function:        10/13/2021  8:44 AM 07/05/2020    8:13 AM 06/02/2019   12:27 PM  6CIT Screen  What Year? 0 points 0 points 0 points  What month? 0 points 0 points 0 points  What time? 0 points  0 points  Count back from 20 0 points 0 points 0 points  Months in reverse 0 points 0 points 0 points  Repeat phrase 0 points 0 points 0 points  Total Score 0 points  0 points    Immunizations Immunization History  Administered Date(s) Administered   Fluad Quad(high Dose 65+) 02/06/2019   Influenza Whole  02/15/2017   Influenza, High Dose Seasonal PF 01/22/2017   Influenza,inj,Quad PF,6+ Mos 02/14/2018   Influenza-Unspecified 02/14/2018, 03/29/2020, 02/10/2021   PFIZER(Purple Top)SARS-COV-2 Vaccination 05/19/2019, 06/09/2019, 03/29/2020   Pneumococcal Conjugate-13 02/06/2019   Pneumococcal Polysaccharide-23 08/28/2020   Tdap 01/09/2018    TDAP status: Up to date  Flu Vaccine status: Up to date  Pneumococcal vaccine status: Up to date  Covid-19 vaccine status: Completed vaccines  Qualifies for Shingles Vaccine? No    Screening Tests Health Maintenance  Topic Date Due   Hepatitis C Screening  Never done   COVID-19 Vaccine (4 - Pfizer series) 05/24/2020   MAMMOGRAM  01/01/2021   INFLUENZA VACCINE  12/02/2021   DEXA SCAN  03/08/2023   TETANUS/TDAP  01/10/2028   Pneumonia Vaccine 75+ Years old  Completed   HPV VACCINES  Aged Out   Zoster Vaccines- Shingrix  Discontinued    Health Maintenance  Health Maintenance Due  Topic Date Due   Hepatitis C Screening  Never done   COVID-19 Vaccine (4 - [place] series) 05/24/2020   MAMMOGRAM  01/01/2021    Colorectal cancer screening: No longer required.   Mammogram status: No longer required due to pt preference .   Bone Density status: Completed 03/07/21. Results reflect: Bone density results: OSTEOPENIA. Repeat every 2 years.   Additional Screening:  Hepatitis C Screening: does qualify;  Vision Screening: Recommended annual ophthalmology exams for early detection of glaucoma and other disorders of the eye. Is the patient up to date with their annual eye exam?  Yes  Who is the provider or what is the name of the office in which the patient attends annual eye exams? Dr Alesia Banda  If pt is not established with a provider, would they like to be referred to a provider to establish care? No .   Dental Screening: Recommended annual dental exams for proper oral hygiene  Community Resource Referral / Chronic Care Management: CRR  required this visit?  No   CCM required this visit?  No      Plan:     I have personally reviewed and noted the following in the patient's chart:   Medical and social history Use of alcohol, tobacco or illicit drugs  Current medications and supplements including opioid prescriptions.  Functional ability and status Nutritional status Physical activity Advanced directives List of other physicians Hospitalizations, surgeries, and ER visits in previous 12 months Vitals Screenings to include cognitive, depression, and falls Referrals and appointments  In addition, I have reviewed and discussed with patient certain preventive protocols, quality metrics, and best practice recommendations. A written personalized care plan for preventive services as well as general preventive health recommendations were provided to patient.     Willette Brace, LPN   [id number]   Nurse Notes: none

## 2021-10-23 NOTE — Progress Notes (Signed)
Carelink Summary Report / Loop Recorder 

## 2021-10-30 DIAGNOSIS — G4733 Obstructive sleep apnea (adult) (pediatric): Secondary | ICD-10-CM | POA: Diagnosis not present

## 2021-11-03 ENCOUNTER — Other Ambulatory Visit: Payer: Self-pay | Admitting: *Deleted

## 2021-11-03 MED ORDER — ATORVASTATIN CALCIUM 40 MG PO TABS: ORAL_TABLET | ORAL | 3 refills | Status: DC

## 2021-11-07 LAB — CUP PACEART REMOTE DEVICE CHECK
Date Time Interrogation Session: 20230703230407
Implantable Pulse Generator Implant Date: 20220218

## 2021-11-10 ENCOUNTER — Ambulatory Visit (INDEPENDENT_AMBULATORY_CARE_PROVIDER_SITE_OTHER): Payer: Medicare PPO

## 2021-11-10 DIAGNOSIS — I480 Paroxysmal atrial fibrillation: Secondary | ICD-10-CM

## 2021-11-27 ENCOUNTER — Other Ambulatory Visit: Payer: Self-pay | Admitting: *Deleted

## 2021-11-27 ENCOUNTER — Telehealth: Payer: Self-pay | Admitting: Family Medicine

## 2021-11-27 MED ORDER — OMEPRAZOLE 20 MG PO CPDR: DELAYED_RELEASE_CAPSULE | ORAL | 3 refills | Status: DC

## 2021-11-27 NOTE — Telephone Encounter (Signed)
..   Encourage patient to contact the pharmacy for refills or they can request refills through [facility]  LAST APPOINTMENT DATE:  Please schedule appointment if longer than 1 year 06/20/21  NEXT APPOINTMENT DATE:  MEDICATION:omeprazole (PRILOSEC) 20 MG capsule  Is the patient out of medication? Yes  PHARMACY:  [id number] - [place], [place] - [address] AT [facility] [address] [facility] [address] Phone:  [phone]  Fax:  [phone]     Let patient know to contact pharmacy at the end of the day to make sure medication is ready.  Please notify patient to allow 48-72 hours to process

## 2021-11-27 NOTE — Telephone Encounter (Signed)
Rx send to [facility]  °

## 2021-12-01 DIAGNOSIS — M25562 Pain in left knee: Secondary | ICD-10-CM | POA: Diagnosis not present

## 2021-12-01 DIAGNOSIS — M25561 Pain in right knee: Secondary | ICD-10-CM | POA: Diagnosis not present

## 2021-12-10 NOTE — Progress Notes (Signed)
Carelink Summary Report / Loop Recorder 

## 2021-12-11 LAB — CUP PACEART REMOTE DEVICE CHECK
Date Time Interrogation Session: 20230805231141
Implantable Pulse Generator Implant Date: 20220218

## 2021-12-15 ENCOUNTER — Ambulatory Visit: Payer: Medicare PPO

## 2022-01-06 ENCOUNTER — Telehealth: Payer: Self-pay

## 2022-01-06 NOTE — Telephone Encounter (Signed)
Received VM from Pt on 01/05/22 asking if her remote transmission had been received.  Called Pt today 01/06/2022 and advised we had gotten her transmission and no afib had been detected.

## 2022-01-15 ENCOUNTER — Telehealth: Payer: Self-pay | Admitting: Family Medicine

## 2022-01-15 NOTE — Telephone Encounter (Signed)
Pt states: -she no longer sees the previous provider who wrote presciption. -she is available for a phone call if PCP team has any questions.    LAST APPOINTMENT DATE:   06/20/21 OV with PCP  NEXT APPOINTMENT DATE: 02/03/22 CPE   MEDICATION: CELEBREX 100 MG capsule [[id number]]    Is the patient out of medication?  No, runs out on 09/15   PHARMACY: [facility][id number] [place], [place] - [address] AT [facility] [address] [facility] [address] [facility] [address]  [address], [place] [address]  Phone:  [phone]  Fax:  [phone]  DEA #:  [id number]

## 2022-01-19 ENCOUNTER — Ambulatory Visit (INDEPENDENT_AMBULATORY_CARE_PROVIDER_SITE_OTHER): Payer: Medicare PPO

## 2022-01-19 DIAGNOSIS — I480 Paroxysmal atrial fibrillation: Secondary | ICD-10-CM

## 2022-01-20 LAB — CUP PACEART REMOTE DEVICE CHECK
Date Time Interrogation Session: 20230917230857
Implantable Pulse Generator Implant Date: 20220218

## 2022-01-21 ENCOUNTER — Other Ambulatory Visit: Payer: Self-pay

## 2022-01-21 MED ORDER — CELEBREX 100 MG PO CAPS: 100.0000 mg | ORAL_CAPSULE | Freq: Two times a day (BID) | ORAL | 1 refills | Status: DC

## 2022-01-21 NOTE — Telephone Encounter (Signed)
Rx sent to pharmacy   

## 2022-01-26 ENCOUNTER — Encounter: Payer: Self-pay | Admitting: *Deleted

## 2022-01-31 NOTE — Progress Notes (Signed)
Carelink Summary Report / Loop Recorder 

## 2022-02-03 ENCOUNTER — Encounter: Payer: Self-pay | Admitting: Family Medicine

## 2022-02-03 ENCOUNTER — Ambulatory Visit (INDEPENDENT_AMBULATORY_CARE_PROVIDER_SITE_OTHER): Payer: Medicare PPO | Admitting: Family Medicine

## 2022-02-03 VITALS — BP 127/78 | HR 66 | Temp 98.6°F | Ht 63.78 in | Wt 198.4 lb

## 2022-02-03 DIAGNOSIS — Z0001 Encounter for general adult medical examination with abnormal findings: Secondary | ICD-10-CM | POA: Diagnosis not present

## 2022-02-03 DIAGNOSIS — R739 Hyperglycemia, unspecified: Secondary | ICD-10-CM

## 2022-02-03 DIAGNOSIS — J302 Other seasonal allergic rhinitis: Secondary | ICD-10-CM | POA: Diagnosis not present

## 2022-02-03 DIAGNOSIS — E538 Deficiency of other specified B group vitamins: Secondary | ICD-10-CM | POA: Diagnosis not present

## 2022-02-03 DIAGNOSIS — E559 Vitamin D deficiency, unspecified: Secondary | ICD-10-CM | POA: Diagnosis not present

## 2022-02-03 DIAGNOSIS — K219 Gastro-esophageal reflux disease without esophagitis: Secondary | ICD-10-CM

## 2022-02-03 DIAGNOSIS — I10 Essential (primary) hypertension: Secondary | ICD-10-CM

## 2022-02-03 DIAGNOSIS — Z1159 Encounter for screening for other viral diseases: Secondary | ICD-10-CM | POA: Diagnosis not present

## 2022-02-03 DIAGNOSIS — I480 Paroxysmal atrial fibrillation: Secondary | ICD-10-CM | POA: Diagnosis not present

## 2022-02-03 DIAGNOSIS — G4733 Obstructive sleep apnea (adult) (pediatric): Secondary | ICD-10-CM | POA: Diagnosis not present

## 2022-02-03 DIAGNOSIS — E785 Hyperlipidemia, unspecified: Secondary | ICD-10-CM

## 2022-02-03 DIAGNOSIS — M1990 Unspecified osteoarthritis, unspecified site: Secondary | ICD-10-CM

## 2022-02-03 LAB — LIPID PANEL
Cholesterol: 145 mg/dL (ref 0–200)
HDL: 72.1 mg/dL (ref >39.00)
LDL Cholesterol: 54 mg/dL (ref 0–99)
NonHDL: 72.83
Total CHOL/HDL Ratio: 2
Triglycerides: 92 mg/dL (ref 0.0–149.0)
VLDL: 18.4 mg/dL (ref 0.0–40.0)

## 2022-02-03 LAB — CBC
HCT: 42.1 % (ref 36.0–46.0)
Hemoglobin: 13.9 g/dL (ref 12.0–15.0)
MCHC: 33.1 g/dL (ref 30.0–36.0)
MCV: 94.6 fl (ref 78.0–100.0)
Platelets: 188 10*3/uL (ref 150.0–400.0)
RBC: 4.45 Mil/uL (ref 3.87–5.11)
RDW: 14.6 % (ref 11.5–15.5)
WBC: 6.6 10*3/uL (ref 4.0–10.5)

## 2022-02-03 LAB — COMPREHENSIVE METABOLIC PANEL
ALT: 19 U/L (ref 0–35)
AST: 19 U/L (ref 0–37)
Albumin: 4.2 g/dL (ref 3.5–5.2)
Alkaline Phosphatase: 85 U/L (ref 39–117)
BUN: 10 mg/dL (ref 6–23)
CO2: 31 mEq/L (ref 19–32)
Calcium: 9.6 mg/dL (ref 8.4–10.5)
Chloride: 102 mEq/L (ref 96–112)
Creatinine, Ser: 0.95 mg/dL (ref 0.40–1.20)
GFR: 56.9 mL/min — ABNORMAL LOW (ref >60.00)
Glucose, Bld: 92 mg/dL (ref 70–99)
Potassium: 4.2 mEq/L (ref 3.5–5.1)
Sodium: 141 mEq/L (ref 135–145)
Total Bilirubin: 0.8 mg/dL (ref 0.2–1.2)
Total Protein: 7.3 g/dL (ref 6.0–8.3)

## 2022-02-03 LAB — VITAMIN D 25 HYDROXY (VIT D DEFICIENCY, FRACTURES): VITD: 28.68 ng/mL — ABNORMAL LOW (ref 30.00–100.00)

## 2022-02-03 LAB — HEMOGLOBIN A1C: Hgb A1c MFr Bld: 5.8 % (ref 4.6–6.5)

## 2022-02-03 LAB — TSH: TSH: 4.16 u[IU]/mL (ref 0.35–5.50)

## 2022-02-03 LAB — VITAMIN B12: Vitamin B-12: 333 pg/mL (ref 211–911)

## 2022-02-03 MED ORDER — CELEBREX 100 MG PO CAPS: 100.0000 mg | ORAL_CAPSULE | Freq: Two times a day (BID) | ORAL | 3 refills | Status: DC

## 2022-02-03 NOTE — Assessment & Plan Note (Signed)
Stable on Celebrex 100 mg twice daily as needed.  Will refill today.

## 2022-02-03 NOTE — Assessment & Plan Note (Signed)
On omeprazole 20 mg daily.  Recheck B12 today.

## 2022-02-03 NOTE — Assessment & Plan Note (Signed)
Check labs.  Continue Lipitor 40 mg daily. 

## 2022-02-03 NOTE — Assessment & Plan Note (Signed)
Check A1c. 

## 2022-02-03 NOTE — Assessment & Plan Note (Signed)
Follows with cardiology.  Rate controlled on metoprolol succinate 25 mg daily.  Regular rate and rhythm today.  Recently had heart monitoring that did not show any recurrence of A-fib.

## 2022-02-03 NOTE — Progress Notes (Signed)
Chief Complaint:  Brenda [name] is a 81 y.o. female who presents today for her annual comprehensive physical exam.    Assessment/Plan:  New/Acute Problems: Malaise / Other Fatigue Unclear etiology.  We will check requested labs today.  She has not seen her pulmonologist in a few years for CPAP management.  If labs are unremarkable would recommend she follow-up with them soon to further evaluate.  Chronic Problems Addressed Today: Hyperglycemia Check A1c.   B12 deficiency Check B12.   Vitamin D deficiency Check vitamin D.  Dyslipidemia Check labs.  Continue Lipitor 40 mg daily.  Seasonal allergies Stable on Claritin as needed seasonally.  Osteoarthritis Stable on Celebrex 100 mg twice daily as needed.  Will refill today.  GERD (gastroesophageal reflux disease) On omeprazole 20 mg daily.  Recheck B12 today.  Hypertension At goal on metoprolol succinate 25 mg daily.  OSA (obstructive sleep apnea) Continue management per pulm. Advised her to follow up with them soon.   Paroxysmal A-fib [facility]) Follows with cardiology.  Rate controlled on metoprolol succinate 25 mg daily.  Regular rate and rhythm today.  Recently had heart monitoring that did not show any recurrence of A-fib.  Preventative Healthcare: Check labs.  Up-to-date on cancer screenings.  Up-to-date on vaccines.  Patient Counseling(The following topics were reviewed and/or handout was given):  -Nutrition: Stressed importance of moderation in sodium/caffeine intake, saturated fat and cholesterol, caloric balance, sufficient intake of fresh fruits, vegetables, and fiber.  -Stressed the importance of regular exercise.   -Substance Abuse: Discussed cessation/primary prevention of tobacco, alcohol, or other drug use; driving or other dangerous activities under the influence; availability of treatment for abuse.   -Injury prevention: Discussed safety belts, safety helmets, smoke detector, smoking near bedding or  upholstery.   -Sexuality: Discussed sexually transmitted diseases, partner selection, use of condoms, avoidance of unintended pregnancy and contraceptive alternatives.   -Dental health: Discussed importance of regular tooth brushing, flossing, and dental visits.  -Health maintenance and immunizations reviewed. Please refer to Health maintenance section.  Return to care in 1 year for next preventative visit.     Subjective:  HPI:  She has no acute complaints today.   She has had some issues with malaise and fatigue for the last couple of weeks. No fevers or chills. Took a covid test which was negative. She has been able to walk her normal distance without any issues with shortness of breath or chest pain. Some sinus pressure.  No cough.  She has been taking more naps which is unusual for her.  Lifestyle Diet: Balanced. Plenty of fruits and vegetables.  Exercise: Walks daily.      02/03/2022    8:15 AM  Depression screen PHQ 2/9  Decreased Interest 0  Down, Depressed, Hopeless 0  PHQ - 2 Score 0    Health Maintenance Due  Topic Date Due   Hepatitis C Screening  Never done   COVID-19 Vaccine (4 - Pfizer series) 05/24/2020     ROS: Per HPI, otherwise a complete review of systems was negative.   PMH:  The following were reviewed and entered/updated in epic: Past Medical History:  Diagnosis Date   Atrial fibrillation ([place])    Diverticulosis    Foot fracture, left 01/2020   broken heel spur    GERD (gastroesophageal reflux disease)    Hypertension    OSA (obstructive sleep apnea)    severe   Paroxysmal A-fib ([place])    s/p afib ablation at [facility]   Patient  Active Problem List   Diagnosis Date Noted   Osteopenia last DEXA 2022 03/07/2021   Right foot pain 02/24/2021   Hyperglycemia 08/28/2020   Vitamin D deficiency 06/05/2019   B12 deficiency 06/05/2019   Seasonal allergies 10/24/2018   Dyslipidemia 10/24/2018   Osteoarthritis 07/06/2017   Paroxysmal A-fib ([facility])     OSA (obstructive sleep apnea)    Hypertension    GERD (gastroesophageal reflux disease)    Diverticulosis    Past Surgical History:  Procedure Laterality Date   ATRIAL FIBRILLATION ABLATION     [employer]   CATARACT EXTRACTION, BILATERAL  2020   CHOLECYSTECTOMY  1977   COLON RESECTION  2005   due to diverticulitis   COLONOSCOPY     Multiple   implantable loop recorder implant  06/21/2020   Medtronic Reveal Linq model LNQ 22 (RLB [id number]) implantable loop recorder (with previously placed NFAO1 removed)   LOOP RECORDER INSERTION N/A 12/08/2016   Procedure: LOOP RECORDER INSERTION;  Surgeon: Hillis Range, MD;  Location: [facility] INVASIVE CV LAB;  Service: Cardiovascular;  Laterality: N/A;   TOTAL ABDOMINAL HYSTERECTOMY  1985    Family History  Problem Relation Age of Onset   Stroke Mother    Parkinson's disease Father    Colon cancer Neg Hx    Esophageal cancer Neg Hx    Pancreatic cancer Neg Hx    Stomach cancer Neg Hx    Liver disease Neg Hx     Medications- reviewed and updated Current Outpatient Medications  Medication Sig Dispense Refill   aspirin 81 MG tablet Take 81 mg by mouth daily.     atorvastatin (LIPITOR) 40 MG tablet TAKE 1 TABLET(40 MG) BY MOUTH DAILY 90 tablet 3   furosemide (LASIX) 20 MG tablet TAKE 1 TABLET(20 MG) BY MOUTH DAILY AS NEEDED FOR FLUID RETENTION 90 tablet 3   loratadine (CLARITIN) 10 MG tablet Take 10 mg by mouth daily as needed for allergies.     metoprolol succinate (TOPROL-XL) 25 MG 24 hr tablet TAKE 1 TABLET(25 MG) BY MOUTH DAILY 90 tablet 3   minoxidil (ROGAINE) 2 % external solution Apply topically 2 (two) times daily.     omeprazole (PRILOSEC) 20 MG capsule TAKE 1 CAPSULE(20 MG) BY MOUTH DAILY 90 capsule 3   CELEBREX 100 MG capsule Take 1 capsule (100 mg total) by mouth 2 (two) times daily. 180 capsule 3   No current facility-administered medications for this visit.    Allergies-reviewed and updated No Known Allergies  Social  History   Socioeconomic History   Marital status: Divorced    Spouse name: Not on file   Number of children: Not on file   Years of education: Not on file   Highest education level: Not on file  Occupational History   Occupation: retired   Tobacco Use   Smoking status: Former    Packs/day: 0.50    Years: 48.00    Total pack years: 24.00    Types: Cigarettes    Start date: 15    Quit date: 07/03/2006    Years since quitting: 15.6   Smokeless tobacco: Never  Vaping Use   Vaping Use: Never used  Substance and Sexual Activity   Alcohol use: Yes    Alcohol/week: 1.0 standard drink of alcohol    Types: 1 Glasses of wine per week    Comment: occasion   Drug use: No   Sexual activity: Never  Other Topics Concern   Not on file  Social History  Narrative   2018 moved to [place] from [place]   Retired Tourist information centre manager   3 daughters daughter in [place] is a physical therapist   2 cups of coffee a day no tobacco now though former smoker no drug use   Social Determinants of Corporate investment banker Strain: Low Risk  (10/13/2021)   Overall Financial Resource Strain (CARDIA)    Difficulty of Paying Living Expenses: Not hard at all  Food Insecurity: No Food Insecurity (10/13/2021)   Hunger Vital Sign    Worried About Running Out of Food in the Last Year: Never true    Ran Out of Food in the Last Year: Never true  Transportation Needs: No Transportation Needs (10/13/2021)   PRAPARE - Administrator, Civil Service (Medical): No    Lack of Transportation (Non-Medical): No  Physical Activity: Sufficiently Active (10/13/2021)   Exercise Vital Sign    Days of Exercise per Week: 5 days    Minutes of Exercise per Session: 30 min  Stress: No Stress Concern Present (10/13/2021)   [employer] of Occupational Health - Occupational Stress Questionnaire    Feeling of Stress : Not at all  Social Connections: Moderately Isolated (10/13/2021)   Social Connection and  Isolation Panel [NHANES]    Frequency of Communication with Friends and Family: More than three times a week    Frequency of Social Gatherings with Friends and Family: More than three times a week    Attends Religious Services: More than 4 times per year    Active Member of [employer] or Organizations: No    Attends Engineer, structural: Never    Marital Status: Divorced        Objective:  Physical Exam: BP 127/78   Pulse 66   Temp 98.6 F (37 C) (Temporal)   Ht 5' 3.78" (1.62 m)   Wt 198 lb 6.4 oz (90 kg)   SpO2 96%   BMI 34.29 kg/m   Body mass index is 34.29 kg/m. Wt Readings from Last 3 Encounters:  02/03/22 198 lb 6.4 oz (90 kg)  10/13/21 202 lb 3.2 oz (91.7 kg)  07/03/21 208 lb 9.6 oz (94.6 kg)   Gen: NAD, resting comfortably HEENT: TMs normal bilaterally. OP clear. No thyromegaly noted.  CV: RRR with no murmurs appreciated Pulm: NWOB, CTAB with no crackles, wheezes, or rhonchi GI: Normal bowel sounds present. Soft, Nontender, Nondistended. MSK: no edema, cyanosis, or clubbing noted Skin: warm, dry Neuro: CN2-12 grossly intact. Strength 5/5 in upper and lower extremities. Reflexes symmetric and intact bilaterally.  Psych: Normal affect and thought content     Abb Gobert M. Jimmey Ralph, MD 02/03/2022 8:46 AM

## 2022-02-03 NOTE — Assessment & Plan Note (Signed)
Check B12 

## 2022-02-03 NOTE — Assessment & Plan Note (Signed)
At goal on metoprolol succinate 25 mg daily. 

## 2022-02-03 NOTE — Patient Instructions (Signed)
It was very nice to see you today!  We we will check blood work today.  We need to have you follow-up with the pulmonologist depending on results of the blood work.  Please continue work on diet and exercise.  I will see back in year for your next physical.  Come back sooner if needed.  Take care, Dr Jerline Pain  PLEASE NOTE:  If you had any lab tests please let us know if you have not heard back within a few days. You may see your results on mychart before we have a chance to review them but we will give you a call once they are reviewed by [place]. If we ordered any referrals today, please let us know if you have not heard from their office within the next week.   Please try these tips to maintain a healthy lifestyle:  Eat at least 3 REAL meals and 1-2 snacks per day.  Aim for no more than 5 hours between eating.  If you eat breakfast, please do so within one hour of getting up.   Each meal should contain half fruits/vegetables, one quarter protein, and one quarter carbs (no bigger than a computer mouse)  Cut down on sweet beverages. This includes juice, soda, and sweet tea.   Drink at least 1 glass of water with each meal and aim for at least 8 glasses per day  Exercise at least 150 minutes every week.     Preventive Care 53 Years and Older, Female Preventive care refers to lifestyle choices and visits with your health care provider that can promote health and wellness. Preventive care visits are also called wellness exams. What can I expect for my preventive care visit? Counseling Your health care provider may ask you questions about your: Medical history, including: Past medical problems. Family medical history. Pregnancy and menstrual history. History of falls. Current health, including: Memory and ability to understand (cognition). Emotional well-being. Home life and relationship well-being. Sexual activity and sexual health. Lifestyle, including: Alcohol, nicotine or  tobacco, and drug use. Access to firearms. Diet, exercise, and sleep habits. Work and work Statistician. Sunscreen use. Safety issues such as seatbelt and bike helmet use. Physical exam Your health care provider will check your: Height and weight. These may be used to calculate your BMI (body mass index). BMI is a measurement that tells if you are at a healthy weight. Waist circumference. This measures the distance around your waistline. This measurement also tells if you are at a healthy weight and may help predict your risk of certain diseases, such as type 2 diabetes and high blood pressure. Heart rate and blood pressure. Body temperature. Skin for abnormal spots. What immunizations do I need?  Vaccines are usually given at various ages, according to a schedule. Your health care provider will recommend vaccines for you based on your age, medical history, and lifestyle or other factors, such as travel or where you work. What tests do I need? Screening Your health care provider may recommend screening tests for certain conditions. This may include: Lipid and cholesterol levels. Hepatitis C test. Hepatitis B test. HIV (human immunodeficiency virus) test. STI (sexually transmitted infection) testing, if you are at risk. Lung cancer screening. Colorectal cancer screening. Diabetes screening. This is done by checking your blood sugar (glucose) after you have not eaten for a while (fasting). Mammogram. Talk with your health care provider about how often you should have regular mammograms. BRCA-related cancer screening. This may be done if you have  a family history of breast, ovarian, tubal, or peritoneal cancers. Bone density scan. This is done to screen for osteoporosis. Talk with your health care provider about your test results, treatment options, and if necessary, the need for more tests. Follow these instructions at home: Eating and drinking  Eat a diet that includes fresh fruits  and vegetables, whole grains, lean protein, and low-fat dairy products. Limit your intake of foods with high amounts of sugar, saturated fats, and salt. Take vitamin and mineral supplements as recommended by your health care provider. Do not drink alcohol if your health care provider tells you not to drink. If you drink alcohol: Limit how much you have to 0-1 drink a day. Know how much alcohol is in your drink. In the [place] one drink equals one 12 oz bottle of beer (355 mL), one 5 oz glass of wine (148 mL), or one 1 oz glass of hard liquor (44 mL). Lifestyle Brush your teeth every morning and night with fluoride toothpaste. Floss one time each day. Exercise for at least 30 minutes 5 or more days each week. Do not use any products that contain nicotine or tobacco. These products include cigarettes, chewing tobacco, and vaping devices, such as e-cigarettes. If you need help quitting, ask your health care provider. Do not use drugs. If you are sexually active, practice safe sex. Use a condom or other form of protection in order to prevent STIs. Take aspirin only as told by your health care provider. Make sure that you understand how much to take and what form to take. Work with your health care provider to find out whether it is safe and beneficial for you to take aspirin daily. Ask your health care provider if you need to take a cholesterol-lowering medicine (statin). Find healthy ways to manage stress, such as: Meditation, yoga, or listening to music. Journaling. Talking to a trusted person. Spending time with friends and family. Minimize exposure to UV radiation to reduce your risk of skin cancer. Safety Always wear your seat belt while driving or riding in a vehicle. Do not drive: If you have been drinking alcohol. Do not ride with someone who has been drinking. When you are tired or distracted. While texting. If you have been using any mind-altering substances or drugs. Wear a helmet  and other protective equipment during sports activities. If you have firearms in your house, make sure you follow all gun safety procedures. What's next? Visit your health care provider once a year for an annual wellness visit. Ask your health care provider how often you should have your eyes and teeth checked. Stay up to date on all vaccines. This information is not intended to replace advice given to you by your health care provider. Make sure you discuss any questions you have with your health care provider. Document Revised: 10/16/2020 Document Reviewed: 10/16/2020 Elsevier Patient Education  [place].

## 2022-02-03 NOTE — Assessment & Plan Note (Signed)
Continue management per pulm. Advised her to follow up with them soon.

## 2022-02-03 NOTE — Assessment & Plan Note (Signed)
Check vitamin D. 

## 2022-02-03 NOTE — Assessment & Plan Note (Signed)
Stable on Claritin as needed seasonally.

## 2022-02-04 LAB — HEPATITIS C ANTIBODY: Hepatitis C Ab: NONREACTIVE

## 2022-02-06 ENCOUNTER — Other Ambulatory Visit: Payer: Self-pay

## 2022-02-06 DIAGNOSIS — E559 Vitamin D deficiency, unspecified: Secondary | ICD-10-CM

## 2022-02-06 DIAGNOSIS — E538 Deficiency of other specified B group vitamins: Secondary | ICD-10-CM

## 2022-02-06 NOTE — Progress Notes (Signed)
Please inform patient of the following:  B12 and vitamin D are a bit low but everything else is stable.  This could explain some of her symptoms.  Recommend that she take 5000 IUs vitamin D daily and the least 1000 mcg of B12 daily.  We should recheck both of these in 3 months.

## 2022-02-16 ENCOUNTER — Ambulatory Visit: Payer: Medicare PPO | Admitting: Nurse Practitioner

## 2022-02-20 ENCOUNTER — Encounter: Payer: Self-pay | Admitting: Nurse Practitioner

## 2022-02-20 ENCOUNTER — Ambulatory Visit: Payer: Medicare PPO | Admitting: Nurse Practitioner

## 2022-02-20 VITALS — BP 120/64 | HR 63 | Temp 98.5°F | Ht 63.0 in | Wt 202.4 lb

## 2022-02-20 DIAGNOSIS — G9331 Postviral fatigue syndrome: Secondary | ICD-10-CM | POA: Diagnosis not present

## 2022-02-20 DIAGNOSIS — G4733 Obstructive sleep apnea (adult) (pediatric): Secondary | ICD-10-CM | POA: Diagnosis not present

## 2022-02-20 DIAGNOSIS — E538 Deficiency of other specified B group vitamins: Secondary | ICD-10-CM | POA: Diagnosis not present

## 2022-02-20 DIAGNOSIS — E559 Vitamin D deficiency, unspecified: Secondary | ICD-10-CM | POA: Diagnosis not present

## 2022-02-20 NOTE — Patient Instructions (Addendum)
Continue to use CPAP every night, minimum of 4-6 hours a night.  Change equipment every 30 days or as directed by DME. Wash your tubing with warm soap and water daily, hang to dry. Wash humidifier portion weekly.  Be aware of reduced alertness and do not drive or operate heavy machinery if experiencing this or drowsiness.  Exercise encouraged, as tolerated. Avoid or decrease alcohol consumption and medications that make you more sleepy, if possible. Notify if persistent daytime sleepiness occurs even with consistent use of CPAP.  Tighten CPAP settings to 12-20 cmH2O  Follow up in 6 months with Dr. Halford Chessman or Joellen Jersey Joelly Bolanos,NP or sooner if symptoms do not improve

## 2022-02-20 NOTE — Assessment & Plan Note (Signed)
Vit D 28.6. Increased to 5000 IUs daily by her PCP. Plan to recheck in 3 months.

## 2022-02-20 NOTE — Assessment & Plan Note (Signed)
Viral infection 4 weeks ago; URI symptoms resolved but still with persistent fatigue. See above. Encouraged her to remain active and rest as needed. She will let us know if symptoms do not improve.

## 2022-02-20 NOTE — Progress Notes (Signed)
Reviewed and agree with assessment/plan.   Mili Piltz, MD [facility] 02/20/2022, 12:30 PM Pager:  [phone]  

## 2022-02-20 NOTE — Assessment & Plan Note (Signed)
Excellent compliance and good control with residual AHI 1.2/h. Fatigue is likely post viral and related to decreased vitamin D/B12 levels. She is using average of 17.3 cmH2O on PAP; current settings are 5-18. We discussed adjusting her settings to 12-20. Evaluate download in 6 weeks.   Patient Instructions  Continue to use CPAP every night, minimum of 4-6 hours a night.  Change equipment every 30 days or as directed by DME. Wash your tubing with warm soap and water daily, hang to dry. Wash humidifier portion weekly.  Be aware of reduced alertness and do not drive or operate heavy machinery if experiencing this or drowsiness.  Exercise encouraged, as tolerated. Avoid or decrease alcohol consumption and medications that make you more sleepy, if possible. Notify if persistent daytime sleepiness occurs even with consistent use of CPAP.  Tighten CPAP settings to 12-20 cmH2O  Follow up in 6 months with Dr. Halford Chessman or Joellen Jersey Malikah Principato,NP or sooner if symptoms do not improve

## 2022-02-20 NOTE — Assessment & Plan Note (Signed)
B12 333. Started on 1000 mcg daily. See above. Follow up with PCP as scheduled.

## 2022-02-20 NOTE — Progress Notes (Signed)
@Patient  ID: , female    DOB: 1940/06/13, 81 y.o.   MRN: [id number]  Chief Complaint  Patient presents with   Follow-up    Follow up for osa. She does have cpap machine. Pt states she has not been feeling well for about 4 weeks. Pt had ekg and it was normal. Pt states she has just been feeling run down and tired lately. Blood work done. Now she is here to make sure her lungs are ok.     Referring provider: [id number], MD  HPI: 81 year old female, former smoker followed for obstructive sleep apnea and dyspnea. She is a patient of Dr. 76 and last seen in office on 01/08/2021. Past medical history significant for PAF, HTN, GERD, diverticulosis.   TEST/EVENTS:  04/14/2016 HST: AHI 39.1, SpO2 low 67% 03/03/2018 PFT: FVC 106, FEV1 115, ratio 79, TLC 104, DLCOunc 84. No BD 03/08/2020 CTA chest: ascending aorta 42 mm; RLL nodule 5 mm, stable since 2020. Previous granulomatous infection  01/08/2021: OV with Dr. 03/10/2021. DOE; most likely from obesity, deconditioning, diastolic dysfunction. Encouraged to maintain a regular exercise regimen. Compliant with CPAP; machine is older >58 years old. Orders sent for new machine 5-18 cmH2O.   02/20/2022: Today - follow up Patient presents today for overdue follow up; unfortunately also having some acute symptoms. She developed a URI around 4 weeks ago. COVID negative. She has had persistent fatigue since then and just feels run down. She was evaluated by her PCP; she was found to have low vitamin D and B12 levels and started on replacement. Otherwise, labs were unremarkable. She also reached out to her cardiologist, who reviewed her loop recorder without any abnormalities. She wanted to come see 02/22/2022 to make sure her sleep apnea was still well controlled. She denies any shortness of breath, chest tightness, palpitations, orthopnea, leg swelling, PND, dizziness, fevers, chills, night sweats. She wears her CPAP nightly, usually gets 7-8 hours of  sleep. She exercises almost daily.   01/20/2022-02/18/2022 CPAP 5-18 cmH2O Download 30/30 days; 100% >4 hr; av use 7 hr 9 min Pressure median 14, 95th 17.3 Leaks median 0.2, 95th 24.2 AHI 1.2  No Known Allergies  Immunization History  Administered Date(s) Administered   Fluad Quad(high Dose 65+) 02/06/2019   Influenza Whole 02/15/2017   Influenza, High Dose Seasonal PF 01/22/2017   Influenza,inj,Quad PF,6+ Mos 02/14/2018   Influenza-Unspecified 02/14/2018, 03/29/2020, 02/10/2021   PFIZER(Purple Top)SARS-COV-2 Vaccination 05/19/2019, 06/09/2019, 03/29/2020   Pneumococcal Conjugate-13 02/06/2019   Pneumococcal Polysaccharide-23 08/28/2020   Tdap 01/09/2018    Past Medical History:  Diagnosis Date   Atrial fibrillation (HCC)    Diverticulosis    Foot fracture, left 01/2020   broken heel spur    GERD (gastroesophageal reflux disease)    Hypertension    OSA (obstructive sleep apnea)    severe   Paroxysmal A-fib (HCC)    s/p afib ablation at [facility]    Tobacco History: Social History   Tobacco Use  Smoking Status Former   Packs/day: 0.50   Years: 48.00   Total pack years: 24.00   Types: Cigarettes   Start date: 40   Quit date: 07/03/2006   Years since quitting: 15.6  Smokeless Tobacco Never   Counseling given: Not Answered   Outpatient Medications Prior to Visit  Medication Sig Dispense Refill   aspirin 81 MG tablet Take 81 mg by mouth daily.     atorvastatin (LIPITOR) 40 MG tablet TAKE 1 TABLET(40 MG)  BY MOUTH DAILY 90 tablet 3   CELEBREX 100 MG capsule Take 1 capsule (100 mg total) by mouth 2 (two) times daily. 180 capsule 3   cholecalciferol (VITAMIN D3) 25 MCG (1000 UNIT) tablet Take 1,000 Units by mouth daily.     furosemide (LASIX) 20 MG tablet TAKE 1 TABLET(20 MG) BY MOUTH DAILY AS NEEDED FOR FLUID RETENTION 90 tablet 3   loratadine (CLARITIN) 10 MG tablet Take 10 mg by mouth daily as needed for allergies.     metoprolol succinate (TOPROL-XL) 25 MG 24 hr  tablet TAKE 1 TABLET(25 MG) BY MOUTH DAILY 90 tablet 3   minoxidil (ROGAINE) 2 % external solution Apply topically 2 (two) times daily.     omeprazole (PRILOSEC) 20 MG capsule TAKE 1 CAPSULE(20 MG) BY MOUTH DAILY 90 capsule 3   vitamin B-12 (CYANOCOBALAMIN) 100 MCG tablet Take 100 mcg by mouth daily.     No facility-administered medications prior to visit.     Review of Systems:   Constitutional: No weight loss or gain, night sweats, fevers, chills. +fatigue, lassitude. HEENT: No headaches, difficulty swallowing, tooth/dental problems, or sore throat. No sneezing, itching, ear ache, nasal congestion, or post nasal drip CV:  No chest pain, orthopnea, PND, swelling in lower extremities, anasarca, dizziness, palpitations, syncope Resp: No shortness of breath with exertion or at rest. No excess mucus or change in color of mucus. No productive or non-productive. No hemoptysis. No wheezing.  No chest wall deformity GI:  No heartburn, indigestion, abdominal pain, nausea, vomiting, diarrhea, change in bowel habits, loss of appetite, bloody stools.  MSK:  No joint pain or swelling.  No decreased range of motion.  No back pain. Neuro: No dizziness or lightheadedness.  Psych: No depression or anxiety. Mood stable.     Physical Exam:  BP 120/64 (BP Location: Left Arm, Patient Position: Sitting, Cuff Size: Normal)   Pulse 63   Temp 98.5 F (36.9 C) (Oral)   Ht 5\' 3"  (1.6 m)   Wt 202 lb 6.4 oz (91.8 kg)   SpO2 97%   BMI 35.85 kg/m   GEN: Pleasant, interactive, well-appearing; obese; in no acute distress. HEENT:  Normocephalic and atraumatic. PERRLA. Sclera white. Nasal turbinates pink, moist and patent bilaterally. No rhinorrhea present. Oropharynx pink and moist, without exudate or edema. No lesions, ulcerations, or postnasal drip.  NECK:  Supple w/ fair ROM. No JVD present. Normal carotid impulses w/o bruits. Thyroid symmetrical with no goiter or nodules palpated. No lymphadenopathy.   CV:  RRR, no m/r/g, no peripheral edema. Pulses intact, +2 bilaterally. No cyanosis, pallor or clubbing. PULMONARY:  Unlabored, regular breathing. Clear bilaterally A&P w/o wheezes/rales/rhonchi. No accessory muscle use.  GI: BS present and normoactive. Soft, non-tender to palpation. No organomegaly or masses detected.  MSK: No erythema, warmth or tenderness. Cap refil <2 sec all extrem. No deformities or joint swelling noted.  Neuro: A/Ox3. No focal deficits noted.   Skin: Warm, no lesions or rashe Psych: Normal affect and behavior. Judgement and thought content appropriate.     Lab Results:  CBC    Component Value Date/Time   WBC 6.6 02/03/2022 0845   RBC 4.45 02/03/2022 0845   HGB 13.9 02/03/2022 0845   HCT 42.1 02/03/2022 0845   PLT 188.0 02/03/2022 0845   MCV 94.6 02/03/2022 0845   MCHC 33.1 02/03/2022 0845   RDW 14.6 02/03/2022 0845    BMET    Component Value Date/Time   NA 141 02/03/2022 0845   NA  142 02/26/2021 0821   K 4.2 02/03/2022 0845   CL 102 02/03/2022 0845   CO2 31 02/03/2022 0845   GLUCOSE 92 02/03/2022 0845   BUN 10 02/03/2022 0845   BUN 12 02/26/2021 0821   CREATININE 0.95 02/03/2022 0845   CALCIUM 9.6 02/03/2022 0845   GFRNONAA 72 03/06/2020 0825   GFRAA 84 03/06/2020 0825    BNP No results found for: "BNP"   Imaging:  No results found.       Latest Ref Rng & Units 03/03/2018   11:22 AM  PFT Results  FVC-Pre L 3.14   FVC-Predicted Pre % 106   FVC-Post L 3.26   FVC-Predicted Post % 110   Pre FEV1/FVC % % 81   Post FEV1/FCV % % 79   FEV1-Pre L 2.54   FEV1-Predicted Pre % 115   FEV1-Post L 2.58   DLCO uncorrected ml/min/mmHg 21.57   DLCO UNC% % 84   DLVA Predicted % 88   TLC L 5.46   TLC % Predicted % 104   RV % Predicted % 91     No results found for: "NITRICOXIDE"      Assessment & Plan:   OSA (obstructive sleep apnea) Excellent compliance and good control with residual AHI 1.2/h. Fatigue is likely post viral and related  to decreased vitamin D/B12 levels. She is using average of 17.3 cmH2O on PAP; current settings are 5-18. We discussed adjusting her settings to 12-20. Evaluate download in 6 weeks.   Patient Instructions  Continue to use CPAP every night, minimum of 4-6 hours a night.  Change equipment every 30 days or as directed by DME. Wash your tubing with warm soap and water daily, hang to dry. Wash humidifier portion weekly.  Be aware of reduced alertness and do not drive or operate heavy machinery if experiencing this or drowsiness.  Exercise encouraged, as tolerated. Avoid or decrease alcohol consumption and medications that make you more sleepy, if possible. Notify if persistent daytime sleepiness occurs even with consistent use of CPAP.  Tighten CPAP settings to 12-20 cmH2O  Follow up in 6 months with Dr. Halford Chessman or Joellen Jersey Faizan Geraci,NP or sooner if symptoms do not improve      Postviral fatigue syndrome Viral infection 4 weeks ago; URI symptoms resolved but still with persistent fatigue. See above. Encouraged her to remain active and rest as needed. She will let us know if symptoms do not improve.   Vitamin D deficiency Vit D 28.6. Increased to 5000 IUs daily by her PCP. Plan to recheck in 3 months.   B12 deficiency B12 333. Started on 1000 mcg daily. See above. Follow up with PCP as scheduled.    I spent 35 minutes of dedicated to the care of this patient on the date of this encounter to include pre-visit review of records, face-to-face time with the patient discussing conditions above, post visit ordering of testing, clinical documentation with the electronic health record, making appropriate referrals as documented, and communicating necessary findings to members of the patients care team.  Clayton Bibles, NP 02/20/2022  Pt aware and understands NP's role.

## 2022-02-23 ENCOUNTER — Ambulatory Visit (INDEPENDENT_AMBULATORY_CARE_PROVIDER_SITE_OTHER): Payer: Medicare PPO

## 2022-02-23 DIAGNOSIS — I480 Paroxysmal atrial fibrillation: Secondary | ICD-10-CM

## 2022-02-24 ENCOUNTER — Encounter: Payer: Medicare PPO | Admitting: Student

## 2022-02-24 LAB — CUP PACEART REMOTE DEVICE CHECK
Date Time Interrogation Session: 20231022231000
Implantable Pulse Generator Implant Date: 20220218

## 2022-03-06 ENCOUNTER — Other Ambulatory Visit: Payer: Self-pay | Admitting: Internal Medicine

## 2022-03-06 NOTE — Telephone Encounter (Signed)
This is a A-Fib clinic pt 

## 2022-03-10 DIAGNOSIS — G4733 Obstructive sleep apnea (adult) (pediatric): Secondary | ICD-10-CM | POA: Diagnosis not present

## 2022-03-18 NOTE — Progress Notes (Signed)
Carelink Summary Report / Loop Recorder 

## 2022-03-30 ENCOUNTER — Ambulatory Visit (INDEPENDENT_AMBULATORY_CARE_PROVIDER_SITE_OTHER): Payer: Medicare PPO

## 2022-03-30 DIAGNOSIS — I480 Paroxysmal atrial fibrillation: Secondary | ICD-10-CM | POA: Diagnosis not present

## 2022-04-01 ENCOUNTER — Ambulatory Visit: Payer: Medicare PPO

## 2022-04-01 LAB — CUP PACEART REMOTE DEVICE CHECK
Date Time Interrogation Session: 20231128230738
Implantable Pulse Generator Implant Date: 20220218

## 2022-04-16 NOTE — Progress Notes (Signed)
Electrophysiology Office Note Date: 04/20/2022  ID:  Steven, Veazie [birth year], MRN [id number]  PCP: Ardith Dark, MD Primary Cardiologist: None Electrophysiologist: Dr. Johney Frame -> Dr. Elberta Fortis   CC: ILR follow-up  Amylynn Fano is a 81 y.o. female seen today for Dr. Elberta Fortis . she presents today for routine electrophysiology followup. Since last being seen in our clinic the patient reports doing very well.  she denies chest pain, palpitations, dyspnea, PND, orthopnea, nausea, vomiting, dizziness, syncope,  weight gain, or early satiety. She has mild edema in the evenings that she takes lasix daily for. She is drinking > 64 oz water daily  Device History: Medtronic loop recorder implanted 2018, exchanged for [facility] 06/21/20 for  AF/PACs/PAFs   Past Medical History:  Diagnosis Date   Atrial fibrillation [facility])    Diverticulosis    Foot fracture, left 01/2020   broken heel spur    GERD (gastroesophageal reflux disease)    Hypertension    OSA (obstructive sleep apnea)    severe   Paroxysmal A-fib (HCC)    s/p afib ablation at [facility]   Past Surgical History:  Procedure Laterality Date   ATRIAL FIBRILLATION ABLATION     [employer]   CATARACT EXTRACTION, BILATERAL  2020   CHOLECYSTECTOMY  1977   COLON RESECTION  2005   due to diverticulitis   COLONOSCOPY     Multiple   implantable loop recorder implant  06/21/2020   Medtronic Reveal Linq model LNQ 22 (RLB [id number]) implantable loop recorder (with previously placed NWGN5 removed)   LOOP RECORDER INSERTION N/A 12/08/2016   Procedure: LOOP RECORDER INSERTION;  Surgeon: Hillis Range, MD;  Location: [facility] INVASIVE CV LAB;  Service: Cardiovascular;  Laterality: N/A;   TOTAL ABDOMINAL HYSTERECTOMY  1985    Current Outpatient Medications  Medication Sig Dispense Refill   aspirin 81 MG tablet Take 81 mg by mouth daily.     atorvastatin (LIPITOR) 40 MG tablet TAKE 1 TABLET(40 MG) BY MOUTH DAILY 90 tablet 3   CELEBREX 100  MG capsule Take 1 capsule (100 mg total) by mouth 2 (two) times daily. 180 capsule 3   cholecalciferol (VITAMIN D3) 25 MCG (1000 UNIT) tablet Take 1,000 Units by mouth daily.     furosemide (LASIX) 20 MG tablet TAKE 1 TABLET(20 MG) BY MOUTH DAILY AS NEEDED FOR FLUID RETENTION 90 tablet 3   loratadine (CLARITIN) 10 MG tablet Take 10 mg by mouth daily as needed for allergies.     metoprolol succinate (TOPROL-XL) 25 MG 24 hr tablet TAKE 1 TABLET(25 MG) BY MOUTH DAILY 90 tablet 3   minoxidil (ROGAINE) 2 % external solution Apply topically 2 (two) times daily.     omeprazole (PRILOSEC) 20 MG capsule TAKE 1 CAPSULE(20 MG) BY MOUTH DAILY 90 capsule 3   vitamin B-12 (CYANOCOBALAMIN) 100 MCG tablet Take 100 mcg by mouth daily.     No current facility-administered medications for this visit.    Allergies:   Patient has no known allergies.   Social History: Social History   Socioeconomic History   Marital status: Divorced    Spouse name: Not on file   Number of children: Not on file   Years of education: Not on file   Highest education level: Not on file  Occupational History   Occupation: retired   Tobacco Use   Smoking status: Former    Packs/day: 0.50    Years: 48.00    Total pack years: 24.00  Types: Cigarettes    Start date: 76    Quit date: 07/03/2006    Years since quitting: 15.8   Smokeless tobacco: Never  Vaping Use   Vaping Use: Never used  Substance and Sexual Activity   Alcohol use: Yes    Alcohol/week: 1.0 standard drink of alcohol    Types: 1 Glasses of wine per week    Comment: occasion   Drug use: No   Sexual activity: Never  Other Topics Concern   Not on file  Social History Narrative   2018 moved to [place] from [place]   Retired Tourist information centre manager   3 daughters daughter in [place] is a physical therapist   2 cups of coffee a day no tobacco now though former smoker no drug use   Social Determinants of Corporate investment banker Strain: Low Risk   (10/13/2021)   Overall Financial Resource Strain (CARDIA)    Difficulty of Paying Living Expenses: Not hard at all  Food Insecurity: No Food Insecurity (10/13/2021)   Hunger Vital Sign    Worried About Running Out of Food in the Last Year: Never true    Ran Out of Food in the Last Year: Never true  Transportation Needs: No Transportation Needs (10/13/2021)   PRAPARE - Administrator, Civil Service (Medical): No    Lack of Transportation (Non-Medical): No  Physical Activity: Sufficiently Active (10/13/2021)   Exercise Vital Sign    Days of Exercise per Week: 5 days    Minutes of Exercise per Session: 30 min  Stress: No Stress Concern Present (10/13/2021)   [employer] of Occupational Health - Occupational Stress Questionnaire    Feeling of Stress : Not at all  Social Connections: Moderately Isolated (10/13/2021)   Social Connection and Isolation Panel [NHANES]    Frequency of Communication with Friends and Family: More than three times a week    Frequency of Social Gatherings with Friends and Family: More than three times a week    Attends Religious Services: More than 4 times per year    Active Member of [employer] or Organizations: No    Attends Banker Meetings: Never    Marital Status: Divorced  Catering manager Violence: Not At Risk (10/13/2021)   Humiliation, Afraid, Rape, and Kick questionnaire    Fear of Current or Ex-Partner: No    Emotionally Abused: No    Physically Abused: No    Sexually Abused: No    Family History: Family History  Problem Relation Age of Onset   Stroke Mother    Parkinson's disease Father    Colon cancer Neg Hx    Esophageal cancer Neg Hx    Pancreatic cancer Neg Hx    Stomach cancer Neg Hx    Liver disease Neg Hx      Review of Systems: All other systems reviewed and are otherwise negative except as noted above.  Physical Exam: Vitals:   04/20/22 0921  BP: 110/60  Pulse: 75  SpO2: 95%  Weight: 204 lb (92.5 kg)   Height: 5\' 3"  (1.6 m)     GEN- The patient is well appearing, alert and oriented x 3 today.   HEENT: normocephalic, atraumatic; sclera clear, conjunctiva pink; hearing intact; oropharynx clear; neck supple  Lungs- Clear to ausculation bilaterally, normal work of breathing.  No wheezes, rales, rhonchi Heart- Regular rate and rhythm, no murmurs, rubs or gallops  GI- soft, non-tender, non-distended, bowel sounds present  Extremities- no clubbing, cyanosis,  or edema  MS- no significant deformity or atrophy Skin- warm and dry, no rash or lesion; ILR pocket well healed Psych- euthymic mood, full affect Neuro- strength and sensation are intact  PPM Interrogation- reviewed in detail today,  See PACEART report  EKG:  EKG is not ordered today.  Recent Labs: 02/03/2022: ALT 19; BUN 10; Creatinine, Ser 0.95; Hemoglobin 13.9; Platelets 188.0; Potassium 4.2; Sodium 141; TSH 4.16   Wt Readings from Last 3 Encounters:  04/20/22 204 lb (92.5 kg)  02/20/22 202 lb 6.4 oz (91.8 kg)  02/03/22 198 lb 6.4 oz (90 kg)     Other studies Reviewed: Additional studies/ records that were reviewed today include: Previous EP office notes, Previous remote checks, Most recent labwork.   Assessment and Plan:  1.  PAF/PACs  s/p Medtronic Loop recorder Normal device function by nightly transmissions No episodes via Carelink Check.    2. HTN Stable on current regimen   3. OSA Encouraged nightly CPAP   4. Ascending thoracic aortic aneurysm (51mm) CT 02/2021 reviewed Repeat study for continued annual monitoring.  5. Chronic diastolic CHF Suspect her evening edema is due more to salt and fluid intake.  Encouraged salt and fluid restriction, will urge more strongly pending labwork.  May benefit from cutting back to lasix every other day and monitoring her intake more closely.    Current medicines are reviewed at length with the patient today.   The patient does not have concerns regarding her medicines.   The following changes were made today:  none  Labs/ tests ordered today include:  Orders Placed This Encounter  Procedures   Basic metabolic panel     Disposition:   Follow up with Dr. Elberta Fortis  in 12 Months    Signed, Luane School  04/20/2022 9:57 AM  [facility] [address] [place] [address] [phone] (office) [phone] (fax)

## 2022-04-20 ENCOUNTER — Encounter: Payer: Self-pay | Admitting: Student

## 2022-04-20 ENCOUNTER — Ambulatory Visit: Payer: Medicare PPO | Attending: Student | Admitting: Student

## 2022-04-20 VITALS — BP 110/60 | HR 75 | Ht 63.0 in | Wt 204.0 lb

## 2022-04-20 DIAGNOSIS — I10 Essential (primary) hypertension: Secondary | ICD-10-CM | POA: Diagnosis not present

## 2022-04-20 DIAGNOSIS — I480 Paroxysmal atrial fibrillation: Secondary | ICD-10-CM | POA: Diagnosis not present

## 2022-04-20 DIAGNOSIS — R002 Palpitations: Secondary | ICD-10-CM

## 2022-04-20 DIAGNOSIS — G4733 Obstructive sleep apnea (adult) (pediatric): Secondary | ICD-10-CM

## 2022-04-20 DIAGNOSIS — I7121 Aneurysm of the ascending aorta, without rupture: Secondary | ICD-10-CM

## 2022-04-20 LAB — BASIC METABOLIC PANEL
BUN/Creatinine Ratio: 10 — ABNORMAL LOW (ref 12–28)
BUN: 10 mg/dL (ref 8–27)
CO2: 27 mmol/L (ref 20–29)
Calcium: 9.6 mg/dL (ref 8.7–10.3)
Chloride: 103 mmol/L (ref 96–106)
Creatinine, Ser: 0.99 mg/dL (ref 0.57–1.00)
Glucose: 90 mg/dL (ref 70–99)
Potassium: 4 mmol/L (ref 3.5–5.2)
Sodium: 143 mmol/L (ref 134–144)
eGFR: 58 mL/min/{1.73_m2} — ABNORMAL LOW (ref >59)

## 2022-04-20 NOTE — Patient Instructions (Signed)
Medication Instructions:  Your physician recommends that you continue on your current medications as directed. Please refer to the Current Medication list given to you today.  *If you need a refill on your cardiac medications before your next appointment, please call your pharmacy*   Lab Work: TODAY: BMET  If you have labs (blood work) drawn today and your tests are completely normal, you will receive your results only by: MyChart Message (if you have MyChart) OR A paper copy in the mail If you have any lab test that is abnormal or we need to change your treatment, we will call you to review the results.   Follow-Up: At [facility], you and your health needs are our priority.  As part of our continuing mission to provide you with exceptional heart care, we have created designated Provider Care Teams.  These Care Teams include your primary Cardiologist (physician) and Advanced Practice Providers (APPs -  Physician Assistants and Nurse Practitioners) who all work together to provide you with the care you need, when you need it.   Your next appointment:   1 year(s)  The format for your next appointment:   In Person  Provider:   Loman Brooklyn, MD     Important Information About Sugar

## 2022-05-06 ENCOUNTER — Ambulatory Visit (INDEPENDENT_AMBULATORY_CARE_PROVIDER_SITE_OTHER): Payer: Medicare PPO

## 2022-05-06 DIAGNOSIS — I480 Paroxysmal atrial fibrillation: Secondary | ICD-10-CM | POA: Diagnosis not present

## 2022-05-06 LAB — CUP PACEART REMOTE DEVICE CHECK
Date Time Interrogation Session: 20240102230635
Implantable Pulse Generator Implant Date: 20220218

## 2022-05-08 ENCOUNTER — Telehealth: Payer: Self-pay

## 2022-05-08 NOTE — Telephone Encounter (Signed)
Alert received from [employer] for: 11 new AF episodes, ~4hrs total 1/2, burden 1%, hx of PAF, no OAC ASA only, route to triage.   Monthly summary reports and ROV/PRN LA   Spoke with patient.   On 1/1 and 1/2 dates she was out of town for her birthday.  Eating and drinking wine with "a lot of activity" on those days with family. Wine consumption was a little more than usual as she doesn't usually drink.  She denies feeling any symptoms and is doing well.  Upon further review,  (ILR placed for cryptogenic stroke), patient is high risk. With new onset of confirmed afib, referring patient to Afib clinic for further discussion around [facility] initiation.  LM for patient to return call to discuss afib clinic referral.   FYI to Dr. Curt Bears.

## 2022-05-08 NOTE — Telephone Encounter (Signed)
Spoke with patient. She is in agreement with afib clinic referral. Will wait for call from Rushie Goltz, RN and her team.

## 2022-05-11 ENCOUNTER — Ambulatory Visit (HOSPITAL_COMMUNITY): Payer: Medicare PPO | Admitting: Physician Assistant

## 2022-05-11 NOTE — Progress Notes (Signed)
Carelink Summary Report / Loop Recorder 

## 2022-05-13 ENCOUNTER — Ambulatory Visit (HOSPITAL_COMMUNITY)
Admission: RE | Admit: 2022-05-13 | Discharge: 2022-05-13 | Disposition: A | Discharge status: Home / Self Care | Payer: Medicare PPO | Source: Ambulatory Visit | Attending: Nurse Practitioner | Admitting: Nurse Practitioner

## 2022-05-13 ENCOUNTER — Encounter (HOSPITAL_COMMUNITY): Payer: Self-pay | Admitting: Nurse Practitioner

## 2022-05-13 VITALS — BP 150/88 | HR 79 | Ht 63.0 in | Wt 209.4 lb

## 2022-05-13 DIAGNOSIS — I480 Paroxysmal atrial fibrillation: Secondary | ICD-10-CM

## 2022-05-13 DIAGNOSIS — Z7901 Long term (current) use of anticoagulants: Secondary | ICD-10-CM | POA: Diagnosis not present

## 2022-05-13 DIAGNOSIS — Z79899 Other long term (current) drug therapy: Secondary | ICD-10-CM | POA: Diagnosis not present

## 2022-05-13 DIAGNOSIS — Z87891 Personal history of nicotine dependence: Secondary | ICD-10-CM | POA: Insufficient documentation

## 2022-05-13 MED ORDER — APIXABAN 5 MG PO TABS: 5.0000 mg | ORAL_TABLET | Freq: Two times a day (BID) | ORAL | 3 refills | Status: DC

## 2022-05-13 NOTE — Progress Notes (Addendum)
Primary Care Physician: Vivi Barrack, MD Referring Physician: Dr. [place] Cellar Brenda [name] is a 82 y.o. female with a h/o paroxysmal afib with a Linq for surveillance that is here for f/u.  She reports that she is doing well.She did have 2.5 hours of afib in May 2020 that she was unaware of. Her afib burden is low at 0.1 %. She has a CHA2DS2VASc score of 4 and is not on anticoagulation. She has deferred in the past unless her burden significantly increases by Linq.  In for f/u 05/30/19. She feels well,no issues with afib. Last linq in December showing no arrhythmia, as well as for the last 6 months.   F/u in afib clinic, 05/29/20, she was seen by Dr. Rayann Heman last September and device showed a lot of PAC's and afib burden remained very low. Pt is still deferring DOAC unless her afib burden significantly increases. Her Toprol was increased to 25 mg daily by Dr. Rayann Heman. Using cpap. EKG today shows SR. Her Linq is now with dead battery. The issue is that she is not on DOAC and the Linq was helping to mitigate her risk to watch for afib not on DOAC. She is on Celebrex daily but would prefer not to take DOAC if possible so that she can continue to take her NSAIDs. She is interested to replace Linq.   F/u in afib clinic,07/03/21. Linq reports have not shown any afib and pt continues off anticoagulation as she wanted to be able to take Celebrex for arthritis.  She had covid 2 weeks ago and a few palpitations at that time. Otherwise, no concerns.   F/u in afib clinic, 05/13/21. Referred by the device clinic for 11 new afib episodes, total 4 hours duration form which pt was asymptomatic. She is being referred to discuss start of anticoagulation. Pt has deferred in the past with low afib occurrence. Now with CHA2DS2VASc  score of at least 4. Denies bleeding history. She is sinus rhythm today.  Today, she denies symptoms of palpitations, chest pain, shortness of breath, orthopnea, PND, lower extremity edema,  dizziness, presyncope, syncope, or neurologic sequela. The patient is tolerating medications without difficulties and is otherwise without complaint today.   Past Medical History:  Diagnosis Date   Atrial fibrillation ([place])    Diverticulosis    Foot fracture, left 01/2020   broken heel spur    GERD (gastroesophageal reflux disease)    Hypertension    OSA (obstructive sleep apnea)    severe   Paroxysmal A-fib ([place])    s/p afib ablation at [facility]   Past Surgical History:  Procedure Laterality Date   [place] RESECTION  2005   due to diverticulitis   COLONOSCOPY     Multiple   implantable loop recorder implant  06/21/2020   Medtronic Reveal Linq model LNQ 22 (RLB [id number]) implantable loop recorder (with previously placed JJKK9 removed)   LOOP RECORDER INSERTION N/A 12/08/2016   Procedure: LOOP RECORDER INSERTION;  Surgeon: Thompson Grayer, MD;  Location: [facility];  Service: Cardiovascular;  Laterality: N/A;   TOTAL ABDOMINAL HYSTERECTOMY  1985    Current Outpatient Medications  Medication Sig Dispense Refill   apixaban (ELIQUIS) 5 MG TABS tablet Take 1 tablet (5 mg total) by mouth 2 (two) times daily. 60 tablet 3   atorvastatin (LIPITOR) 40 MG tablet TAKE  1 TABLET(40 MG) BY MOUTH DAILY 90 tablet 3   cholecalciferol (VITAMIN D3) 25 MCG (1000 UNIT) tablet Take 1,000 Units by mouth daily.     furosemide (LASIX) 20 MG tablet TAKE 1 TABLET(20 MG) BY MOUTH DAILY AS NEEDED FOR FLUID RETENTION 90 tablet 3   loratadine (CLARITIN) 10 MG tablet Take 10 mg by mouth daily as needed for allergies.     metoprolol succinate (TOPROL-XL) 25 MG 24 hr tablet TAKE 1 TABLET(25 MG) BY MOUTH DAILY 90 tablet 3   minoxidil (ROGAINE) 2 % external solution Apply topically 2 (two) times daily.     omeprazole (PRILOSEC) 20 MG capsule TAKE 1 CAPSULE(20 MG) BY MOUTH DAILY 90 capsule 3   vitamin  B-12 (CYANOCOBALAMIN) 100 MCG tablet Take 100 mcg by mouth daily.     No current facility-administered medications for this encounter.    No Known Allergies  Social History   Socioeconomic History   Marital status: Divorced    Spouse name: Not on file   Number of children: Not on file   Years of education: Not on file   Highest education level: Not on file  Occupational History   Occupation: retired   Tobacco Use   Smoking status: Former    Packs/day: 0.50    Years: 48.00    Total pack years: 24.00    Types: Cigarettes    Start date: 65    Quit date: 07/03/2006    Years since quitting: 15.8   Smokeless tobacco: Never  Vaping Use   Vaping Use: Never used  Substance and Sexual Activity   Alcohol use: Yes    Alcohol/week: 1.0 standard drink of alcohol    Types: 1 Glasses of wine per week    Comment: occasion   Drug use: No   Sexual activity: Never  Other Topics Concern   Not on file  Social History Narrative   2018 moved to [place] from [place]   Retired Tourist information centre manager   3 daughters daughter in [place] is a physical therapist   2 cups of coffee a day no tobacco now though former smoker no drug use   Social Determinants of Corporate investment banker Strain: Low Risk  (10/13/2021)   Overall Financial Resource Strain (CARDIA)    Difficulty of Paying Living Expenses: Not hard at all  Food Insecurity: No Food Insecurity (10/13/2021)   Hunger Vital Sign    Worried About Running Out of Food in the Last Year: Never true    Ran Out of Food in the Last Year: Never true  Transportation Needs: No Transportation Needs (10/13/2021)   PRAPARE - Administrator, Civil Service (Medical): No    Lack of Transportation (Non-Medical): No  Physical Activity: Sufficiently Active (10/13/2021)   Exercise Vital Sign    Days of Exercise per Week: 5 days    Minutes of Exercise per Session: 30 min  Stress: No Stress Concern Present (10/13/2021)   [employer] of  Occupational Health - Occupational Stress Questionnaire    Feeling of Stress : Not at all  Social Connections: Moderately Isolated (10/13/2021)   Social Connection and Isolation Panel [NHANES]    Frequency of Communication with Friends and Family: More than three times a week    Frequency of Social Gatherings with Friends and Family: More than three times a week    Attends Religious Services: More than 4 times per year    Active Member of Clubs or Organizations: No  Attends Archivist Meetings: Never    Marital Status: Divorced  Human resources officer Violence: Not At Risk (10/13/2021)   Humiliation, Afraid, Rape, and Kick questionnaire    Fear of Current or Ex-Partner: No    Emotionally Abused: No    Physically Abused: No    Sexually Abused: No    Family History  Problem Relation Age of Onset   Stroke Mother    Parkinson's disease Father    Colon cancer Neg Hx    Esophageal cancer Neg Hx    Pancreatic cancer Neg Hx    Stomach cancer Neg Hx    Liver disease Neg Hx     ROS- All systems are reviewed and negative except as per the HPI above  Physical Exam: Vitals:   05/13/22 0839  BP: (!) 150/88  Pulse: 79  Weight: 95 kg  Height: 5\' 3"  (1.6 m)   Wt Readings from Last 3 Encounters:  05/13/22 95 kg  04/20/22 92.5 kg  02/20/22 91.8 kg    Labs: Lab Results  Component Value Date   NA 143 04/20/2022   K 4.0 04/20/2022   CL 103 04/20/2022   CO2 27 04/20/2022   GLUCOSE 90 04/20/2022   BUN 10 04/20/2022   CREATININE 0.99 04/20/2022   CALCIUM 9.6 04/20/2022   No results found for: "INR" Lab Results  Component Value Date   CHOL 145 02/03/2022   HDL 72.10 02/03/2022   LDLCALC 54 02/03/2022   TRIG 92.0 02/03/2022     GEN- The patient is well appearing, alert and oriented x 3 today.   Head- normocephalic, atraumatic Eyes-  Sclera clear, conjunctiva pink Ears- hearing intact Oropharynx- clear Neck- supple, no JVP Lymph- no cervical lymphadenopathy Lungs-  Clear to ausculation bilaterally, normal work of breathing Heart- regular rate and rhythm, no murmurs, rubs or gallops, PMI not laterally displaced GI- soft, NT, ND, + BS Extremities- no clubbing, cyanosis, or edema MS- no significant deformity or atrophy Skin- no rash or lesion Psych- euthymic mood, full affect Neuro- strength and sensation are intact  EKG-sinus brady at 55 bpm, pr int 208 ms, qrs int 86 ms, qtc 417 ms  linq report reviewed from February   Assessment and Plan: 1. Paroxysmal afib S/p ablation in 2019 No significant afib burden seen with Linq until recently, asymptomatic   Continue  metoprolol succinate 25 mg qd without change  2. CHA2DS2VASc score of 4 Pt is willing to start eliquis 5 mg bid  She will stop asa  Use tylenol for pain, avoid anti inflammatories  Bleeding precautions discussed  Denies bleeding history   She has a f/u with PCP 1/29 and will need f/u with CBC/bmet for start of anticoagulation at that time  F/u with afib clinic as needed and Dr. Bosie Helper checks as scheduled   Butch Penny C. Cosandra Plouffe, [facility] [address] [place], [place] [address] [phone]

## 2022-05-13 NOTE — Patient Instructions (Signed)
Start Eliquis 5mg  twice a day  Stop aspirin  Stop celebrex  Have your labs from your primary care faxed to Butch Penny at [phone]

## 2022-05-15 ENCOUNTER — Telehealth: Payer: Self-pay

## 2022-05-15 NOTE — Telephone Encounter (Signed)
Spoke with patient, had patient send transmission no AF episodes recorded

## 2022-05-15 NOTE — Telephone Encounter (Signed)
The patient left a voicemail stating she had some A-fib issues. She would like for the nurse to review her transmission to see if she had Afib. Her phone number is [phone].

## 2022-05-27 ENCOUNTER — Ambulatory Visit
Admission: RE | Admit: 2022-05-27 | Discharge: 2022-05-27 | Disposition: A | Discharge status: Home / Self Care | Payer: Medicare PPO | Source: Ambulatory Visit | Attending: Student | Admitting: Student

## 2022-05-27 DIAGNOSIS — I7121 Aneurysm of the ascending aorta, without rupture: Secondary | ICD-10-CM

## 2022-05-27 DIAGNOSIS — I712 Thoracic aortic aneurysm, without rupture, unspecified: Secondary | ICD-10-CM | POA: Diagnosis not present

## 2022-05-27 MED ORDER — IOPAMIDOL (ISOVUE-370) INJECTION 76%
75.0000 mL | Freq: Once | INTRAVENOUS | Status: AC | PRN
  Administered 2022-05-27: 75 mL via INTRAVENOUS

## 2022-05-28 ENCOUNTER — Ambulatory Visit: Payer: Medicare PPO | Admitting: Family Medicine

## 2022-05-28 VITALS — BP 112/72 | HR 69 | Temp 98.0°F | Ht 63.0 in | Wt 208.6 lb

## 2022-05-28 DIAGNOSIS — I10 Essential (primary) hypertension: Secondary | ICD-10-CM

## 2022-05-28 DIAGNOSIS — E559 Vitamin D deficiency, unspecified: Secondary | ICD-10-CM | POA: Diagnosis not present

## 2022-05-28 DIAGNOSIS — E538 Deficiency of other specified B group vitamins: Secondary | ICD-10-CM | POA: Diagnosis not present

## 2022-05-28 DIAGNOSIS — I480 Paroxysmal atrial fibrillation: Secondary | ICD-10-CM | POA: Diagnosis not present

## 2022-05-28 LAB — COMPREHENSIVE METABOLIC PANEL
ALT: 16 U/L (ref 0–35)
AST: 16 U/L (ref 0–37)
Albumin: 4.3 g/dL (ref 3.5–5.2)
Alkaline Phosphatase: 85 U/L (ref 39–117)
BUN: 11 mg/dL (ref 6–23)
CO2: 29 mEq/L (ref 19–32)
Calcium: 9.4 mg/dL (ref 8.4–10.5)
Chloride: 102 mEq/L (ref 96–112)
Creatinine, Ser: 0.79 mg/dL (ref 0.40–1.20)
GFR: 70.84 mL/min (ref >60.00)
Glucose, Bld: 87 mg/dL (ref 70–99)
Potassium: 3.5 mEq/L (ref 3.5–5.1)
Sodium: 140 mEq/L (ref 135–145)
Total Bilirubin: 0.6 mg/dL (ref 0.2–1.2)
Total Protein: 6.9 g/dL (ref 6.0–8.3)

## 2022-05-28 LAB — CBC
HCT: 42 % (ref 36.0–46.0)
Hemoglobin: 13.9 g/dL (ref 12.0–15.0)
MCHC: 33.1 g/dL (ref 30.0–36.0)
MCV: 93.7 fl (ref 78.0–100.0)
Platelets: 223 10*3/uL (ref 150.0–400.0)
RBC: 4.48 Mil/uL (ref 3.87–5.11)
RDW: 13.6 % (ref 11.5–15.5)
WBC: 7.5 10*3/uL (ref 4.0–10.5)

## 2022-05-28 LAB — VITAMIN B12: Vitamin B-12: 581 pg/mL (ref 211–911)

## 2022-05-28 LAB — VITAMIN D 25 HYDROXY (VIT D DEFICIENCY, FRACTURES): VITD: 42.44 ng/mL (ref 30.00–100.00)

## 2022-05-28 NOTE — Assessment & Plan Note (Signed)
Check vitamin D.  She is on 5000 IUs daily.

## 2022-05-28 NOTE — Progress Notes (Signed)
   Brenda [name] is a 82 y.o. female who presents today for an office visit.  Assessment/Plan:  Chronic Problems Addressed Today: Vitamin D deficiency Check vitamin D.  She is on 5000 IUs daily.  Hypertension Blood pressure at goal today on metoprolol succinate 25mg  daily.   B12 deficiency On 1000 mcg daily.  Check B12 level today.  Paroxysmal A-fib [facility]) Follows with cardiology.  Started on Eliquis.  She has been doing well with this.  Will check CBC and c-Met.  She is rate controlled on metoprolol succinate 25 mg daily.     Subjective:  HPI:  Patient here today for follow-up.  We last saw her a few months ago for annual physical.  At her last visit was found to have low B12 and vitamin D.  Was recommended to start on supplementation with vitamin D 5000 IUs daily and 1000 mcg B12 daily.  She is also seen cardiology since her last visit.  Was started on Eliquis.  She has been tolerating well.  She would like to have labs checked for this as well.       Objective:  Physical Exam: BP 112/72   Pulse 69   Temp 98 F (36.7 C) (Temporal)   Ht 5\' 3"  (1.6 m)   Wt 208 lb 9.6 oz (94.6 kg)   SpO2 97%   BMI 36.95 kg/m   Gen: No acute distress, resting comfortably CV: Regular rate and rhythm with no murmurs appreciated Pulm: Normal work of breathing, clear to auscultation bilaterally with no crackles, wheezes, or rhonchi Neuro: Grossly normal, moves all extremities Psych: Normal affect and thought content      Brenda [name] M. Brenda Pain, MD 05/28/2022 11:12 AM

## 2022-05-28 NOTE — Assessment & Plan Note (Signed)
Follows with cardiology.  Started on Eliquis.  She has been doing well with this.  Will check CBC and c-Met.  She is rate controlled on metoprolol succinate 25 mg daily.

## 2022-05-28 NOTE — Patient Instructions (Signed)
It was very nice to see you today!  Will check blood work today.  I am glad you are doing well.  We will contact you with results once we have them  I will see back in October for your annual checkup.  Come back sooner if needed.  Take care, Dr Jerline Pain  PLEASE NOTE:  If you had any lab tests, please let us know if you have not heard back within a few days. You may see your results on mychart before we have a chance to review them but we will give you a call once they are reviewed by [place].   If we ordered any referrals today, please let us know if you have not heard from their office within the next week.   If you had any urgent prescriptions sent in today, please check with the pharmacy within an hour of our visit to make sure the prescription was transmitted appropriately.   Please try these tips to maintain a healthy lifestyle:  Eat at least 3 REAL meals and 1-2 snacks per day.  Aim for no more than 5 hours between eating.  If you eat breakfast, please do so within one hour of getting up.   Each meal should contain half fruits/vegetables, one quarter protein, and one quarter carbs (no bigger than a computer mouse)  Cut down on sweet beverages. This includes juice, soda, and sweet tea.   Drink at least 1 glass of water with each meal and aim for at least 8 glasses per day  Exercise at least 150 minutes every week.

## 2022-05-28 NOTE — Assessment & Plan Note (Signed)
Blood pressure at goal today on metoprolol succinate 25mg  daily.

## 2022-05-28 NOTE — Assessment & Plan Note (Signed)
On 1000 mcg daily.  Check B12 level today.

## 2022-05-29 NOTE — Progress Notes (Signed)
Please inform patient of the following:  Labs are all stable.  Would like for her to continue her treatment plan.  Do not need to make any adjustments at this point.  We can recheck again in 3 to 6 months.

## 2022-06-01 ENCOUNTER — Ambulatory Visit: Payer: Medicare PPO | Admitting: Family Medicine

## 2022-06-01 NOTE — Progress Notes (Signed)
Carelink Summary Report / Loop Recorder

## 2022-06-08 ENCOUNTER — Ambulatory Visit: Payer: Medicare PPO

## 2022-06-08 DIAGNOSIS — I480 Paroxysmal atrial fibrillation: Secondary | ICD-10-CM

## 2022-06-09 LAB — CUP PACEART REMOTE DEVICE CHECK
Date Time Interrogation Session: 20240204231413
Implantable Pulse Generator Implant Date: 20220218

## 2022-06-10 ENCOUNTER — Other Ambulatory Visit (HOSPITAL_COMMUNITY): Payer: Self-pay

## 2022-06-11 ENCOUNTER — Encounter (HOSPITAL_COMMUNITY): Payer: Self-pay | Admitting: *Deleted

## 2022-07-13 ENCOUNTER — Ambulatory Visit (INDEPENDENT_AMBULATORY_CARE_PROVIDER_SITE_OTHER): Payer: Medicare PPO

## 2022-07-13 DIAGNOSIS — I480 Paroxysmal atrial fibrillation: Secondary | ICD-10-CM

## 2022-07-14 LAB — CUP PACEART REMOTE DEVICE CHECK
Date Time Interrogation Session: 20240310230730
Implantable Pulse Generator Implant Date: 20220218

## 2022-07-22 NOTE — Progress Notes (Signed)
Carelink Summary Report / Loop Recorder 

## 2022-07-27 ENCOUNTER — Other Ambulatory Visit: Payer: Self-pay | Admitting: Family Medicine

## 2022-08-05 DIAGNOSIS — G4733 Obstructive sleep apnea (adult) (pediatric): Secondary | ICD-10-CM | POA: Diagnosis not present

## 2022-08-17 ENCOUNTER — Ambulatory Visit (INDEPENDENT_AMBULATORY_CARE_PROVIDER_SITE_OTHER): Payer: Medicare PPO

## 2022-08-17 DIAGNOSIS — I480 Paroxysmal atrial fibrillation: Secondary | ICD-10-CM | POA: Diagnosis not present

## 2022-08-17 LAB — CUP PACEART REMOTE DEVICE CHECK
Date Time Interrogation Session: 20240414230949
Implantable Pulse Generator Implant Date: 20220218

## 2022-08-20 ENCOUNTER — Ambulatory Visit: Payer: Medicare PPO | Admitting: Nurse Practitioner

## 2022-08-20 ENCOUNTER — Telehealth: Payer: Self-pay | Admitting: Cardiology

## 2022-08-20 ENCOUNTER — Encounter: Payer: Self-pay | Admitting: Nurse Practitioner

## 2022-08-20 VITALS — BP 122/74 | HR 74 | Temp 98.4°F | Ht 63.0 in | Wt 208.6 lb

## 2022-08-20 DIAGNOSIS — I10 Essential (primary) hypertension: Secondary | ICD-10-CM

## 2022-08-20 DIAGNOSIS — I480 Paroxysmal atrial fibrillation: Secondary | ICD-10-CM | POA: Diagnosis not present

## 2022-08-20 DIAGNOSIS — R600 Localized edema: Secondary | ICD-10-CM

## 2022-08-20 DIAGNOSIS — G4733 Obstructive sleep apnea (adult) (pediatric): Secondary | ICD-10-CM

## 2022-08-20 LAB — BASIC METABOLIC PANEL
BUN: 15 mg/dL (ref 6–23)
CO2: 29 mEq/L (ref 19–32)
Calcium: 9.8 mg/dL (ref 8.4–10.5)
Chloride: 103 mEq/L (ref 96–112)
Creatinine, Ser: 1.02 mg/dL (ref 0.40–1.20)
GFR: 52.05 mL/min — ABNORMAL LOW (ref >60.00)
Glucose, Bld: 94 mg/dL (ref 70–99)
Potassium: 3.7 mEq/L (ref 3.5–5.1)
Sodium: 142 mEq/L (ref 135–145)

## 2022-08-20 LAB — BRAIN NATRIURETIC PEPTIDE: Pro B Natriuretic peptide (BNP): 32 pg/mL (ref 0.0–100.0)

## 2022-08-20 NOTE — Telephone Encounter (Signed)
Checked with device clinic. No afib noted on ILR since January. Pt advised to follow up with PCP for further evaluation as to the swelling. Advised to call back if they feel further cardiac evaluation is needed. Patient verbalized understanding and agreeable to plan.

## 2022-08-20 NOTE — Telephone Encounter (Signed)
Pt c/o swelling: STAT is pt has developed SOB within 24 hours  If swelling, where is the swelling located? Both legs, mainly left. Socks are leaving a deep impression. Wrists are swelling, having a hard time putting on watch  How much weight have you gained and in what time span? 8 pounds in about 3-4 weeks   Have you gained 3 pounds in a day or 5 pounds in a week? no  Do you have a log of your daily weights (if so, list)? Weighed 208 today, normally about 200  Are you currently taking a fluid pill? Yes - Pulmonary suggested for 5 days to increase fluid tablets.  Are you currently SOB? no  Have you traveled recently? No   Patient is going out of town next Friday April 26th. Scheduled for an appt on 05/03 with Katrina Stack, PA.

## 2022-08-20 NOTE — Progress Notes (Signed)
GFR is down some but other kidney markers are normal. Her BNP was also normal. Recommend trialing increased dose of lasix and still getting a follow up with cardiology. We will recheck her labs at her f/u appt.

## 2022-08-20 NOTE — Progress Notes (Signed)
Carelink Summary Report / Loop Recorder 

## 2022-08-20 NOTE — Progress Notes (Signed)
  ID: Brenda [name], female    DOB: 1941-01-02, 82 y.o.   MRN: [id number]  Chief Complaint  Patient presents with   Follow-up    Doing well.  Is having fluid retention in both ankles x 4 weeks.  Cardiology loop monitor reading on 08/16/22 was okay    Referring provider: Ardith Dark, MD  HPI: 82 year old female, former smoker followed for obstructive sleep apnea and dyspnea. She is a patient of Dr. Evlyn Courier and last seen in office on 01/08/2021. Past medical history significant for PAF, HTN, GERD, diverticulosis.   TEST/EVENTS:  04/14/2016 HST: AHI 39.1, SpO2 low 67% 03/03/2018 PFT: FVC 106, FEV1 115, ratio 79, TLC 104, DLCOunc 84. No BD 03/08/2020 CTA chest: ascending aorta 42 mm; RLL nodule 5 mm, stable since 2020. Previous granulomatous infection 05/27/2022 CTA chest/aorta: stable ectatic ascending aortic . No PE or acute process. Lungs clear   01/08/2021: OV with Dr. Craige Cotta. DOE; most likely from obesity, deconditioning, diastolic dysfunction. Encouraged to maintain a regular exercise regimen. Compliant with CPAP; machine is older >28 years old. Orders sent for new machine 5-18 cmH2O.   02/20/2022: OV with Jinx Gilden NP for overdue follow up; unfortunately also having some acute symptoms. She developed a URI around 4 weeks ago. COVID negative. She has had persistent fatigue since then and just feels run down. She was evaluated by her PCP; she was found to have low vitamin D and B12 levels and started on replacement. Otherwise, labs were unremarkable. She also reached out to her cardiologist, who reviewed her loop recorder without any abnormalities. She wanted to come see [place] to make sure her sleep apnea was still well controlled. She denies any shortness of breath, chest tightness, palpitations, orthopnea, leg swelling, PND, dizziness, fevers, chills, night sweats. She wears her CPAP nightly, usually gets 7-8 hours of sleep. She exercises almost daily.   08/20/2022: Today - follow up Patient  presents today for follow up. She has been doing well with her CPAP. No concerns or complaints regarding it. She feels like she sleeps well and wakes up rested. Denies any drowsy driving or morning headaches. She has been having issues with swelling in her legs since the beginning of the year but feels like it's gotten worse over the past few days. She has noticed her sandals leaving impressions on the tops of her feet. No PND, increased cough (has a baseline AM cough), orthopnea, SOB, dizziness, CP, PND. She has not talked to her cardiologist about this. She is taking 20 mg of lasix a day. She has been on a diet for the past few months but has not noticed any change in her weight. She is not taking any new supplements. She closely monitors her salt intake. She has been compliant with her Eliquis and denies any missed doses. No calf pain.  05/21/2022-08/18/2022: CPAP 5-18 cmH2O 90/90 days; 100% >4 hr; av use 7 hr 24 min Pressure 95th 17.2 Leaks 36.3 AHI 0.8  No Known Allergies  Immunization History  Administered Date(s) Administered   COVID-19, mRNA, vaccine(Comirnaty)12 years and older 01/26/2022   Fluad Quad(high Dose 65+) 02/06/2019   Influenza Whole 02/15/2017   Influenza, High Dose Seasonal PF 01/22/2017   Influenza,inj,Quad PF,6+ Mos 02/14/2018   Influenza-Unspecified 02/14/2018, 03/29/2020, 02/10/2021, 01/26/2022   PFIZER(Purple Top)SARS-COV-2 Vaccination 05/19/2019, 06/09/2019, 03/29/2020   Pneumococcal Conjugate-13 02/06/2019   Pneumococcal Polysaccharide-23 08/28/2020   Tdap 01/09/2018    Past Medical History:  Diagnosis Date   Atrial fibrillation  Diverticulosis    Foot fracture, left 01/2020   broken heel spur    GERD (gastroesophageal reflux disease)    Hypertension    OSA (obstructive sleep apnea)    severe   Paroxysmal A-fib    s/p afib ablation at [facility]    Tobacco History: Social History   Tobacco Use  Smoking Status Former   Packs/day: 0.50   Years:  48.00   Additional pack years: 0.00   Total pack years: 24.00   Types: Cigarettes   Start date: 53   Quit date: 07/03/2006   Years since quitting: 16.1  Smokeless Tobacco Never   Counseling given: Not Answered   Outpatient Medications Prior to Visit  Medication Sig Dispense Refill   apixaban (ELIQUIS) 5 MG TABS tablet Take 1 tablet (5 mg total) by mouth 2 (two) times daily. 60 tablet 3   atorvastatin (LIPITOR) 40 MG tablet TAKE 1 TABLET(40 MG) BY MOUTH DAILY 90 tablet 3   cholecalciferol (VITAMIN D3) 25 MCG (1000 UNIT) tablet Take 1,000 Units by mouth daily.     furosemide (LASIX) 20 MG tablet TAKE 1 TABLET(20 MG) BY MOUTH DAILY AS NEEDED FOR FLUID RETENTION 90 tablet 3   loratadine (CLARITIN) 10 MG tablet Take 10 mg by mouth daily as needed for allergies.     metoprolol succinate (TOPROL-XL) 25 MG 24 hr tablet TAKE 1 TABLET(25 MG) BY MOUTH DAILY 90 tablet 3   minoxidil (ROGAINE) 2 % external solution Apply topically 2 (two) times daily.     Omega 3 1000 MG CAPS Take by mouth.     omeprazole (PRILOSEC) 20 MG capsule TAKE 1 CAPSULE(20 MG) BY MOUTH DAILY 90 capsule 3   vitamin B-12 (CYANOCOBALAMIN) 100 MCG tablet Take 100 mcg by mouth daily.     No facility-administered medications prior to visit.     Review of Systems:   Constitutional: No weight loss or gain, night sweats, fevers, chills, fatigue, lassitude. HEENT: No headaches, difficulty swallowing, tooth/dental problems, or sore throat. No sneezing, itching, ear ache, nasal congestion, or post nasal drip CV:  + swelling in lower extremities. No chest pain, orthopnea, PND, anasarca, dizziness, palpitations, syncope Resp: No shortness of breath with exertion or at rest. No excess mucus or change in color of mucus. No productive or non-productive. No hemoptysis. No wheezing.  No chest wall deformity GI:  No heartburn, indigestion, abdominal pain, nausea, vomiting, diarrhea, change in bowel habits, loss of appetite, bloody stools.   MSK:  No joint pain or swelling.  No decreased range of motion.  No back pain. Neuro: No dizziness or lightheadedness.  Psych: No depression or anxiety. Mood stable.     Physical Exam:  BP 122/74 (BP Location: Right Arm, Patient Position: Sitting, Cuff Size: Large)   Pulse 74   Temp 98.4 F (36.9 C) (Oral)   Ht  (1.6 m)   Wt 208 lb 9.6 oz (94.6 kg)   SpO2 97%   BMI 36.95 kg/m   GEN: Pleasant, interactive, well-appearing; obese; in no acute distress. HEENT:  Normocephalic and atraumatic. PERRLA. Sclera white. Nasal turbinates pink, moist and patent bilaterally. No rhinorrhea present. Oropharynx pink and moist, without exudate or edema. No lesions, ulcerations, or postnasal drip.  NECK:  Supple w/ fair ROM. No JVD present. Normal carotid impulses w/o bruits. Thyroid symmetrical with no goiter or nodules palpated. No lymphadenopathy.   CV: RRR, no m/r/g, BLE +1 pitting edema. Pulses intact, +2 bilaterally. No cyanosis, pallor or clubbing. PULMONARY:  Unlabored,  regular breathing. Clear bilaterally A&P w/o wheezes/rales/rhonchi. No accessory muscle use.  GI: BS present and normoactive. Soft, non-tender to palpation. No organomegaly or masses detected.  MSK: No erythema, warmth or tenderness. Cap refil <2 sec all extrem. No deformities or joint swelling noted.  Neuro: A/Ox3. No focal deficits noted.   Skin: Warm, no lesions or rashe Psych: Normal affect and behavior. Judgement and thought content appropriate.     Lab Results:  CBC    Component Value Date/Time   WBC 7.5 05/28/2022 1129   RBC 4.48 05/28/2022 1129   HGB 13.9 05/28/2022 1129   HCT 42.0 05/28/2022 1129   PLT 223.0 05/28/2022 1129   MCV 93.7 05/28/2022 1129   MCHC 33.1 05/28/2022 1129   RDW 13.6 05/28/2022 1129    BMET    Component Value Date/Time   NA 142 08/20/2022 0951   NA 143 04/20/2022 1011   K 3.7 08/20/2022 0951   CL 103 08/20/2022 0951   CO2 29 08/20/2022 0951   GLUCOSE 94 08/20/2022 0951    BUN 15 08/20/2022 0951   BUN 10 04/20/2022 1011   CREATININE 1.02 08/20/2022 0951   CALCIUM 9.8 08/20/2022 0951   GFRNONAA 72 03/06/2020 0825   GFRAA 84 03/06/2020 0825    BNP No results found for: "BNP"   Imaging:  CUP PACEART REMOTE DEVICE CHECK  Result Date: 08/17/2022 ILR summary report received. Battery status OK. Normal device function. No new symptom, tachy, brady, or pause episodes. No new AF episodes. Monthly summary reports and ROV/PRN LA, CVRS        Latest Ref Rng & Units 03/03/2018   11:22 AM  PFT Results  FVC-Pre L 3.14   FVC-Predicted Pre % 106   FVC-Post L 3.26   FVC-Predicted Post % 110   Pre FEV1/FVC % % 81   Post FEV1/FCV % % 79   FEV1-Pre L 2.54   FEV1-Predicted Pre % 115   FEV1-Post L 2.58   DLCO uncorrected ml/min/mmHg 21.57   DLCO UNC% % 84   DLVA Predicted % 88   TLC L 5.46   TLC % Predicted % 104   RV % Predicted % 91     No results found for: "NITRICOXIDE"      Assessment & Plan:   OSA (obstructive sleep apnea) Excellent compliance and control on current download. She continues to receive good benefit from use. Understands proper care/maintenance of device and supplies. Aware of safe driving practices.   Patient Instructions  Continue to use CPAP every night, minimum of 4-6 hours a night.  Change equipment every 30 days or as directed by DME. Wash your tubing with warm soap and water daily, hang to dry. Wash humidifier portion weekly.  Be aware of reduced alertness and do not drive or operate heavy machinery if experiencing this or drowsiness.  Exercise encouraged, as tolerated. Notify if persistent daytime sleepiness occurs even with consistent use of CPAP.  Labs today  Increase your lasix to 40 mg (2 tabs) daily for the next 5 days then you can return to your baseline of 20 mg daily. Monitor your blood pressure for goal >110/60 and below 140/90. Notify if you notice any dizziness with increased dosing  Wear compression  stockings during the day to help with swelling. Elevate your legs when you are sitting  Call cardiology for an appointment  Follow up in 2 weeks with Dr. Craige Cotta or Philis Nettle. If symptoms do not improve or worsen, please contact office for  sooner follow up or seek emergency care.    Bilateral lower extremity edema BLE edema x 4 months with worsening over the past few days. She is currently maintained on lasix 20 mg daily. No new medications or supplements to correlate to. Compliant with chronic anticoagulation therapy and bilateral appearance so very low suspicion for DVT. She has not had any weight loss with diet measures and feels like this is due to fluid. Will obtain BNP and BMET today. Increase dosing of lasix for 5 days to re-evaluate response. Encouraged her to also contact cardiology for sooner follow up appt and I will send them a message once I get her lab results back. Advised on return/ED precautions should symptoms worsen or she start having trouble with her breathing/CP.   Hypertension BP nl during OV. She has noticed some systolic readings in the 140-150's at home. Advised her to keep a log to take with her to her cardiology appt. ED precautions reviewed.  Paroxysmal A-fib (HCC) Regular rhythm today. Compliant with Eliquis and metoprolol.    I spent 35 minutes of dedicated to the care of this patient on the date of this encounter to include pre-visit review of records, face-to-face time with the patient discussing conditions above, post visit ordering of testing, clinical documentation with the electronic health record, making appropriate referrals as documented, and communicating necessary findings to members of the patients care team.  Noemi Chapel, NP 08/20/2022  Pt aware and understands NP's role.

## 2022-08-20 NOTE — Assessment & Plan Note (Signed)
Regular rhythm today. Compliant with Eliquis and metoprolol.

## 2022-08-20 NOTE — Assessment & Plan Note (Signed)
Excellent compliance and control on current download. She continues to receive good benefit from use. Understands proper care/maintenance of device and supplies. Aware of safe driving practices.   Patient Instructions  Continue to use CPAP every night, minimum of 4-6 hours a night.  Change equipment every 30 days or as directed by DME. Wash your tubing with warm soap and water daily, hang to dry. Wash humidifier portion weekly.  Be aware of reduced alertness and do not drive or operate heavy machinery if experiencing this or drowsiness.  Exercise encouraged, as tolerated. Notify if persistent daytime sleepiness occurs even with consistent use of CPAP.  Labs today  Increase your lasix to 40 mg (2 tabs) daily for the next 5 days then you can return to your baseline of 20 mg daily. Monitor your blood pressure for goal >110/60 and below 140/90. Notify if you notice any dizziness with increased dosing  Wear compression stockings during the day to help with swelling. Elevate your legs when you are sitting  Call cardiology for an appointment  Follow up in 2 weeks with Dr. Craige Cotta or Philis Nettle. If symptoms do not improve or worsen, please contact office for sooner follow up or seek emergency care.

## 2022-08-20 NOTE — Progress Notes (Signed)
Reviewed and agree with assessment/plan.   [name], MD [facility] 08/20/2022, 1:14 PM Pager:  [phone]

## 2022-08-20 NOTE — Patient Instructions (Signed)
Continue to use CPAP every night, minimum of 4-6 hours a night.  Change equipment every 30 days or as directed by DME. Wash your tubing with warm soap and water daily, hang to dry. Wash humidifier portion weekly.  Be aware of reduced alertness and do not drive or operate heavy machinery if experiencing this or drowsiness.  Exercise encouraged, as tolerated. Notify if persistent daytime sleepiness occurs even with consistent use of CPAP.  Labs today  Increase your lasix to 40 mg (2 tabs) daily for the next 5 days then you can return to your baseline of 20 mg daily. Monitor your blood pressure for goal >110/60 and below 140/90. Notify if you notice any dizziness with increased dosing  Wear compression stockings during the day to help with swelling. Elevate your legs when you are sitting  Call cardiology for an appointment  Follow up in 2 weeks with Dr. Craige Cotta or Philis Nettle. If symptoms do not improve or worsen, please contact office for sooner follow up or seek emergency care.

## 2022-08-20 NOTE — Assessment & Plan Note (Signed)
BLE edema x 4 months with worsening over the past few days. She is currently maintained on lasix 20 mg daily. No new medications or supplements to correlate to. Compliant with chronic anticoagulation therapy and bilateral appearance so very low suspicion for DVT. She has not had any weight loss with diet measures and feels like this is due to fluid. Will obtain BNP and BMET today. Increase dosing of lasix for 5 days to re-evaluate response. Encouraged her to also contact cardiology for sooner follow up appt and I will send them a message once I get her lab results back. Advised on return/ED precautions should symptoms worsen or she start having trouble with her breathing/CP.

## 2022-08-20 NOTE — Assessment & Plan Note (Signed)
BP nl during OV. She has noticed some systolic readings in the 140-150's at home. Advised her to keep a log to take with her to her cardiology appt. ED precautions reviewed.

## 2022-08-24 ENCOUNTER — Encounter: Payer: Self-pay | Admitting: Family Medicine

## 2022-08-24 ENCOUNTER — Ambulatory Visit: Payer: Medicare PPO | Admitting: Family Medicine

## 2022-08-24 VITALS — BP 112/75 | HR 64 | Temp 96.8°F | Ht 63.0 in | Wt 208.0 lb

## 2022-08-24 DIAGNOSIS — E538 Deficiency of other specified B group vitamins: Secondary | ICD-10-CM

## 2022-08-24 DIAGNOSIS — E559 Vitamin D deficiency, unspecified: Secondary | ICD-10-CM

## 2022-08-24 DIAGNOSIS — R600 Localized edema: Secondary | ICD-10-CM | POA: Diagnosis not present

## 2022-08-24 DIAGNOSIS — L719 Rosacea, unspecified: Secondary | ICD-10-CM | POA: Diagnosis not present

## 2022-08-24 DIAGNOSIS — I480 Paroxysmal atrial fibrillation: Secondary | ICD-10-CM | POA: Diagnosis not present

## 2022-08-24 MED ORDER — METRONIDAZOLE 0.75 % EX GEL: 1.0000 | Freq: Two times a day (BID) | CUTANEOUS | 0 refills | Status: AC

## 2022-08-24 NOTE — Assessment & Plan Note (Signed)
Regular rate and rhythm today.  Anticoagulated on Eliquis and rate controlled on metoprolol.

## 2022-08-24 NOTE — Patient Instructions (Addendum)
It was very nice to see you today!  I am glad that your swelling is improving.  You can go back to 1 pill/day after you finish your 5 days of a double dose.  Please come back later this week to recheck your blood work.  We will see you back in 6 months for your annual physical.  Come back sooner if needed.  Return in about 6 months (around 02/23/2023) for Annual Physical.   Take care, Dr Jimmey Ralph  PLEASE NOTE:  If you had any lab tests, please let us know if you have not heard back within a few days. You may see your results on mychart before we have a chance to review them but we will give you a call once they are reviewed by [place].   If we ordered any referrals today, please let us know if you have not heard from their office within the next week.   If you had any urgent prescriptions sent in today, please check with the pharmacy within an hour of our visit to make sure the prescription was transmitted appropriately.   Please try these tips to maintain a healthy lifestyle:  Eat at least 3 REAL meals and 1-2 snacks per day.  Aim for no more than 5 hours between eating.  If you eat breakfast, please do so within one hour of getting up.   Each meal should contain half fruits/vegetables, one quarter protein, and one quarter carbs (no bigger than a computer mouse)  Cut down on sweet beverages. This includes juice, soda, and sweet tea.   Drink at least 1 glass of water with each meal and aim for at least 8 glasses per day  Exercise at least 150 minutes every week.

## 2022-08-24 NOTE — Progress Notes (Signed)
Spoke with pt and notified of results per Katie.  Pt verbalized understanding and denied any questions. 

## 2022-08-24 NOTE — Assessment & Plan Note (Signed)
On 1000 mcg daily.  She will come back later this week for recheck labs.

## 2022-08-24 NOTE — Progress Notes (Signed)
   Brenda [name] is a 82 y.o. female who presents today for an office visit.  Assessment/Plan:  9Chronic Problems Addressed Today: Bilateral lower extremity edema Secondary to venous insufficiency.  No red flag signs or symptoms.  BNP last week was normal and she has no other signs of CHF. She has had significant improvement on Lasix 40 mg daily.  She will go back to 20 mg daily in 2 days.  Advised patient that she can double the dose as needed for lower extremity swelling that does not respond to  daily dose.  Will recheck labs later this week as she did have a slight bump in creatinine last week.  We discussed conservative management including salt avoidance and leg elevation.  She can use compression stockings as needed.   Vitamin D deficiency On 5000 IUs daily.  She will come back later this week to recheck labs.  B12 deficiency On 1000 mcg daily.  She will come back later this week for recheck labs.  Rosacea She is currently trying to tablets with new dermatologist.  Will refill her MetroGel today.  She has been tolerating well without any side effects.  Paroxysmal A-fib (HCC) Regular rate and rhythm today.  Anticoagulated on Eliquis and rate controlled on metoprolol.     Subjective:  HPI:  See A/p for status of chronic conditions.  Patient here today for follow up. We last saw her 3 months ago.  She was doing well at this time.  Since her last visit she was having more issues with lower extremity swelling.  She saw her pulmonologist for OSA and the leg swelling.  She was instructed to increase her Lasix to 40 mg daily for 5 days.  She is now on day 4 of this.  She has had significant improvement in lower extremity swelling.  No pain.  No shortness of breath.  No orthopnea.       Objective:  Physical Exam: BP 112/75   Pulse 64   Temp (!) 96.8 F (36 C) (Temporal)   Ht  (1.6 m)   Wt 208 lb (94.3 kg)   SpO2 98%   BMI 36.85 kg/m   Wt Readings from Last 3  Encounters:  08/24/22 208 lb (94.3 kg)  08/20/22 208 lb 9.6 oz (94.6 kg)  05/28/22 208 lb 9.6 oz (94.6 kg)  Gen: No acute distress, resting comfortably CV: Regular rate and rhythm with no murmurs appreciated Pulm: Normal work of breathing, clear to auscultation bilaterally with no crackles, wheezes, or rhonchi MSK - Legs: Several varicosities in bilateral lower extremities noted.  Trace to 1+ pretibial edema bilaterally. Neuro: Grossly normal, moves all extremities Psych: Normal affect and thought content      Martie Muhlbauer M. Jimmey Ralph, MD 08/24/2022 9:04 AM

## 2022-08-24 NOTE — Assessment & Plan Note (Signed)
She is currently trying to tablets with new dermatologist.  Will refill her MetroGel today.  She has been tolerating well without any side effects.

## 2022-08-24 NOTE — Assessment & Plan Note (Signed)
On 5000 IUs daily.  She will come back later this week to recheck labs.

## 2022-08-24 NOTE — Assessment & Plan Note (Signed)
Secondary to venous insufficiency.  No red flag signs or symptoms.  BNP last week was normal and she has no other signs of CHF. She has had significant improvement on Lasix 40 mg daily.  She will go back to 20 mg daily in 2 days.  Advised patient that she can double the dose as needed for lower extremity swelling that does not respond to  daily dose.  Will recheck labs later this week as she did have a slight bump in creatinine last week.  We discussed conservative management including salt avoidance and leg elevation.  She can use compression stockings as needed.

## 2022-08-25 ENCOUNTER — Other Ambulatory Visit (INDEPENDENT_AMBULATORY_CARE_PROVIDER_SITE_OTHER): Payer: Medicare PPO

## 2022-08-25 DIAGNOSIS — R600 Localized edema: Secondary | ICD-10-CM

## 2022-08-25 DIAGNOSIS — E538 Deficiency of other specified B group vitamins: Secondary | ICD-10-CM

## 2022-08-25 DIAGNOSIS — E559 Vitamin D deficiency, unspecified: Secondary | ICD-10-CM | POA: Diagnosis not present

## 2022-08-25 LAB — VITAMIN D 25 HYDROXY (VIT D DEFICIENCY, FRACTURES): VITD: 53.73 ng/mL (ref 30.00–100.00)

## 2022-08-25 LAB — COMPREHENSIVE METABOLIC PANEL
ALT: 17 U/L (ref 0–35)
AST: 17 U/L (ref 0–37)
Albumin: 4.2 g/dL (ref 3.5–5.2)
Alkaline Phosphatase: 84 U/L (ref 39–117)
BUN: 16 mg/dL (ref 6–23)
CO2: 32 mEq/L (ref 19–32)
Calcium: 9.6 mg/dL (ref 8.4–10.5)
Chloride: 99 mEq/L (ref 96–112)
Creatinine, Ser: 0.94 mg/dL (ref 0.40–1.20)
GFR: 57.41 mL/min — ABNORMAL LOW (ref >60.00)
Glucose, Bld: 88 mg/dL (ref 70–99)
Potassium: 3.5 mEq/L (ref 3.5–5.1)
Sodium: 140 mEq/L (ref 135–145)
Total Bilirubin: 0.6 mg/dL (ref 0.2–1.2)
Total Protein: 6.8 g/dL (ref 6.0–8.3)

## 2022-08-25 LAB — VITAMIN B12: Vitamin B-12: 661 pg/mL (ref 211–911)

## 2022-08-27 NOTE — Progress Notes (Signed)
Labs are stable.  Vitamin D and B12 are back at goal.  Do not need to make any changes to her treatment plan.  She can continue her vitamin D and B12 supplementation.  We can recheck everything in 3 to 6 months.

## 2022-08-28 ENCOUNTER — Encounter: Payer: Medicare PPO | Admitting: Physician Assistant

## 2022-09-03 ENCOUNTER — Ambulatory Visit: Payer: Medicare PPO | Admitting: Nurse Practitioner

## 2022-09-04 ENCOUNTER — Ambulatory Visit: Payer: Medicare PPO | Admitting: Student

## 2022-09-10 ENCOUNTER — Ambulatory Visit (INDEPENDENT_AMBULATORY_CARE_PROVIDER_SITE_OTHER): Payer: Medicare PPO | Admitting: Nurse Practitioner

## 2022-09-10 ENCOUNTER — Encounter: Payer: Self-pay | Admitting: Nurse Practitioner

## 2022-09-10 VITALS — BP 124/72 | HR 62 | Temp 97.8°F | Ht 63.0 in | Wt 208.0 lb

## 2022-09-10 DIAGNOSIS — G4733 Obstructive sleep apnea (adult) (pediatric): Secondary | ICD-10-CM

## 2022-09-10 DIAGNOSIS — R600 Localized edema: Secondary | ICD-10-CM | POA: Diagnosis not present

## 2022-09-10 NOTE — Progress Notes (Signed)
@Patient  ID: Brenda [name], female    DOB: [birth year], 82 y.o.   MRN: [id number]  Chief Complaint  Patient presents with   Follow-up    Pt states she has been feeling good,     Referring provider: Ardith Dark, MD  HPI: 82 year old female, former smoker followed for obstructive sleep apnea and dyspnea. She is a patient of Dr. Evlyn Courier and last seen in office on 08/20/2022 by [facility] NP. Past medical history significant for PAF, HTN, GERD, diverticulosis.   TEST/EVENTS:  04/14/2016 HST: AHI 39.1, SpO2 low 67% 03/03/2018 PFT: FVC 106, FEV1 115, ratio 79, TLC 104, DLCOunc 84. No BD 03/08/2020 CTA chest: ascending aorta 42 mm; RLL nodule 5 mm, stable since 2020. Previous granulomatous infection 05/27/2022 CTA chest/aorta: stable ectatic ascending aortic . No PE or acute process. Lungs clear   01/08/2021: OV with Dr. Craige Cotta. DOE; most likely from obesity, deconditioning, diastolic dysfunction. Encouraged to maintain a regular exercise regimen. Compliant with CPAP; machine is older >24 years old. Orders sent for new machine 5-18 cmH2O.   02/20/2022: OV with Marium Ragan NP for overdue follow up; unfortunately also having some acute symptoms. She developed a URI around 4 weeks ago. COVID negative. She has had persistent fatigue since then and just feels run down. She was evaluated by her PCP; she was found to have low vitamin D and B12 levels and started on replacement. Otherwise, labs were unremarkable. She also reached out to her cardiologist, who reviewed her loop recorder without any abnormalities. She wanted to come see [place] to make sure her sleep apnea was still well controlled. She denies any shortness of breath, chest tightness, palpitations, orthopnea, leg swelling, PND, dizziness, fevers, chills, night sweats. She wears her CPAP nightly, usually gets 7-8 hours of sleep. She exercises almost daily.   08/20/2022: OV with Ysabelle Goodroe NP for follow up. She has been doing well with her CPAP. No concerns or complaints  regarding it. She feels like she sleeps well and wakes up rested. Denies any drowsy driving or morning headaches. She has been having issues with swelling in her legs since the beginning of the year but feels like it's gotten worse over the past few days. She has noticed her sandals leaving impressions on the tops of her feet. No PND, increased cough (has a baseline AM cough), orthopnea, SOB, dizziness, CP, PND. She has not talked to her cardiologist about this. She is taking 20 mg of lasix a day. She has been on a diet for the past few months but has not noticed any change in her weight. She is not taking any new supplements. She closely monitors her salt intake. She has been compliant with her Eliquis and denies any missed doses. No calf pain.  09/10/2022: Today - follow up Patient presents today for follow up. We increased her lasix to 40 mg daily for 5 days after her last visit. BNP and BMET were nl. She felt like this significantly helped her swelling. Didn't feel as puffy. She's gone back to using 20 mg daily. Her PCP did instruct her to increase to 40 mg as needed for swelling. Repeat BMET showed stable kidney function.  Regarding her CPAP, no concerns or complaints. Sleeping well with it and feeling rested.   08/11/2022-09/09/2022:  CPAP auto 5-18 cmH2O 28/30 days; 100% >4 hr; average use 7 hr 41 min Pressure 95th 17.4 Leaks 95th 30.6 AHI 0.8  No Known Allergies  Immunization History  Administered Date(s) Administered  COVID-19, mRNA, vaccine(Comirnaty)12 years and older 01/26/2022   Fluad Quad(high Dose 65+) 02/06/2019   Influenza Whole 02/15/2017   Influenza, High Dose Seasonal PF 01/22/2017   Influenza,inj,Quad PF,6+ Mos 02/14/2018   Influenza-Unspecified 02/14/2018, 03/29/2020, 02/10/2021, 01/26/2022   PFIZER(Purple Top)SARS-COV-2 Vaccination 05/19/2019, 06/09/2019, 03/29/2020   Pneumococcal Conjugate-13 02/06/2019   Pneumococcal Polysaccharide-23 08/28/2020   Tdap 01/09/2018     Past Medical History:  Diagnosis Date   Atrial fibrillation (HCC)    Diverticulosis    Foot fracture, left 01/2020   broken heel spur    GERD (gastroesophageal reflux disease)    Hypertension    OSA (obstructive sleep apnea)    severe   Paroxysmal A-fib (HCC)    s/p afib ablation at [facility]    Tobacco History: Social History   Tobacco Use  Smoking Status Former   Packs/day: 0.50   Years: 48.00   Additional pack years: 0.00   Total pack years: 24.00   Types: Cigarettes   Start date: 40   Quit date: 07/03/2006   Years since quitting: 16.2  Smokeless Tobacco Never   Counseling given: Not Answered   Outpatient Medications Prior to Visit  Medication Sig Dispense Refill   apixaban (ELIQUIS) 5 MG TABS tablet Take 1 tablet (5 mg total) by mouth 2 (two) times daily. 60 tablet 3   atorvastatin (LIPITOR) 40 MG tablet TAKE 1 TABLET(40 MG) BY MOUTH DAILY 90 tablet 3   cholecalciferol (VITAMIN D3) 25 MCG (1000 UNIT) tablet Take 1,000 Units by mouth daily.     furosemide (LASIX) 20 MG tablet TAKE 1 TABLET(20 MG) BY MOUTH DAILY AS NEEDED FOR FLUID RETENTION 90 tablet 3   loratadine (CLARITIN) 10 MG tablet Take 10 mg by mouth daily as needed for allergies.     metoprolol succinate (TOPROL-XL) 25 MG 24 hr tablet TAKE 1 TABLET(25 MG) BY MOUTH DAILY 90 tablet 3   metroNIDAZOLE (METROGEL) 0.75 % gel Apply 1 Application topically 2 (two) times daily. 45 g 0   minoxidil (ROGAINE) 2 % external solution Apply topically 2 (two) times daily.     omeprazole (PRILOSEC) 20 MG capsule TAKE 1 CAPSULE(20 MG) BY MOUTH DAILY 90 capsule 3   vitamin B-12 (CYANOCOBALAMIN) 100 MCG tablet Take 100 mcg by mouth daily.     Omega 3 1000 MG CAPS Take by mouth. (Patient not taking: Reported on 09/10/2022)     No facility-administered medications prior to visit.     Review of Systems:   Constitutional: No weight loss or gain, night sweats, fevers, chills, fatigue, lassitude. HEENT: No headaches, difficulty  swallowing, tooth/dental problems, or sore throat. No sneezing, itching, ear ache, nasal congestion, or post nasal drip CV:  +improved swelling in lower extremities. No chest pain, orthopnea, PND, anasarca, dizziness, palpitations, syncope Resp: No shortness of breath with exertion or at rest. No excess mucus or change in color of mucus. No productive or non-productive. No hemoptysis. No wheezing.  No chest wall deformity GI:  No heartburn, indigestion, abdominal pain, nausea, vomiting, diarrhea, change in bowel habits, loss of appetite, bloody stools.  MSK:  No joint pain or swelling.  No decreased range of motion.  No back pain. Neuro: No dizziness or lightheadedness.  Psych: No depression or anxiety. Mood stable.     Physical Exam:  BP 124/72   Pulse 62   Temp 97.8 F (36.6 C) (Oral)   Ht 5\' 3"  (1.6 m)   Wt 208 lb (94.3 kg)   SpO2 96%   BMI  36.85 kg/m   GEN: Pleasant, interactive, well-appearing; obese; in no acute distress. HEENT:  Normocephalic and atraumatic. PERRLA. Sclera white. Nasal turbinates pink, moist and patent bilaterally. No rhinorrhea present. Oropharynx pink and moist, without exudate or edema. No lesions, ulcerations, or postnasal drip.  NECK:  Supple w/ fair ROM. No JVD present. Normal carotid impulses w/o bruits. Thyroid symmetrical with no goiter or nodules palpated. No lymphadenopathy.   CV: RRR, no m/r/g, dependent BLE edema, non-pitting. Pulses intact, +2 bilaterally. No cyanosis, pallor or clubbing. PULMONARY:  Unlabored, regular breathing. Clear bilaterally A&P w/o wheezes/rales/rhonchi. No accessory muscle use.  GI: BS present and normoactive. Soft, non-tender to palpation. No organomegaly or masses detected.  MSK: No erythema, warmth or tenderness. Cap refil <2 sec all extrem. No deformities or joint swelling noted.  Neuro: A/Ox3. No focal deficits noted.   Skin: Warm, no lesions or rashe Psych: Normal affect and behavior. Judgement and thought content  appropriate.     Lab Results:  CBC    Component Value Date/Time   WBC 7.5 05/28/2022 1129   RBC 4.48 05/28/2022 1129   HGB 13.9 05/28/2022 1129   HCT 42.0 05/28/2022 1129   PLT 223.0 05/28/2022 1129   MCV 93.7 05/28/2022 1129   MCHC 33.1 05/28/2022 1129   RDW 13.6 05/28/2022 1129    BMET    Component Value Date/Time   NA 140 08/25/2022 0950   NA 143 04/20/2022 1011   K 3.5 08/25/2022 0950   CL 99 08/25/2022 0950   CO2 32 08/25/2022 0950   GLUCOSE 88 08/25/2022 0950   BUN 16 08/25/2022 0950   BUN 10 04/20/2022 1011   CREATININE 0.94 08/25/2022 0950   CALCIUM 9.6 08/25/2022 0950   GFRNONAA 72 03/06/2020 0825   GFRAA 84 03/06/2020 0825    BNP No results found for: "BNP"   Imaging:  CUP PACEART REMOTE DEVICE CHECK  Result Date: 08/17/2022 ILR summary report received. Battery status OK. Normal device function. No new symptom, tachy, brady, or pause episodes. No new AF episodes. Monthly summary reports and ROV/PRN LA, CVRS        Latest Ref Rng & Units 03/03/2018   11:22 AM  PFT Results  FVC-Pre L 3.14   FVC-Predicted Pre % 106   FVC-Post L 3.26   FVC-Predicted Post % 110   Pre FEV1/FVC % % 81   Post FEV1/FCV % % 79   FEV1-Pre L 2.54   FEV1-Predicted Pre % 115   FEV1-Post L 2.58   DLCO uncorrected ml/min/mmHg 21.57   DLCO UNC% % 84   DLVA Predicted % 88   TLC L 5.46   TLC % Predicted % 104   RV % Predicted % 91     No results found for: "NITRICOXIDE"      Assessment & Plan:   Bilateral lower extremity edema Likely due to venous insufficiency. BNP normal. She improved with increased dosing of diuretic. Advised that if she is using 40 mg on a regular basis, to discuss with her primary as her potassium will need to be monitored more closely. Encouraged to continue using compression stockings and monitor sodium intake. Follow up with PCP as scheduled.   Patient Instructions  Continue to use CPAP every night, minimum of 4-6 hours a night.  Change  equipment every 30 days or as directed by DME. Wash your tubing with warm soap and water daily, hang to dry. Wash humidifier portion weekly.  Be aware of reduced alertness and do not  drive or operate heavy machinery if experiencing this or drowsiness.  Exercise encouraged, as tolerated. Notify if persistent daytime sleepiness occurs even with consistent use of CPAP.   Continue lasix as directed by your primary care provider. Glad your swelling is better!    Wear compression stockings during the day to help with swelling. Elevate your legs when you are sitting   Follow up in 6 months with Dr. Craige Cotta or Philis Nettle. If symptoms worsen, please contact office for sooner follow up or seek emergency care.   OSA (obstructive sleep apnea) Excellent compliance and control. Receives benefit from use. Aware of safe driving practices.      I spent 25 minutes of dedicated to the care of this patient on the date of this encounter to include pre-visit review of records, face-to-face time with the patient discussing conditions above, post visit ordering of testing, clinical documentation with the electronic health record, making appropriate referrals as documented, and communicating necessary findings to members of the patients care team.  Noemi Chapel, NP 09/11/2022  Pt aware and understands NP's role.

## 2022-09-10 NOTE — Patient Instructions (Addendum)
Continue to use CPAP every night, minimum of 4-6 hours a night.  Change equipment every 30 days or as directed by DME. Wash your tubing with warm soap and water daily, hang to dry. Wash humidifier portion weekly.  Be aware of reduced alertness and do not drive or operate heavy machinery if experiencing this or drowsiness.  Exercise encouraged, as tolerated. Notify if persistent daytime sleepiness occurs even with consistent use of CPAP.   Continue lasix as directed by your primary care provider. Glad your swelling is better!    Wear compression stockings during the day to help with swelling. Elevate your legs when you are sitting   Follow up in 6 months with Dr. Craige Cotta or Philis Nettle. If symptoms worsen, please contact office for sooner follow up or seek emergency care.

## 2022-09-11 ENCOUNTER — Encounter: Payer: Self-pay | Admitting: Nurse Practitioner

## 2022-09-11 NOTE — Progress Notes (Signed)
Reviewed and agree with assessment/plan.   Matheu Ploeger, MD [facility] 09/11/2022, 1:11 PM Pager:  [phone]  

## 2022-09-11 NOTE — Assessment & Plan Note (Signed)
Excellent compliance and control. Receives benefit from use. Aware of safe driving practices.

## 2022-09-11 NOTE — Assessment & Plan Note (Addendum)
Likely due to venous insufficiency. BNP normal. She improved with increased dosing of diuretic. Advised that if she is using 40 mg on a regular basis, to discuss with her primary as her potassium will need to be monitored more closely. Encouraged to continue using compression stockings and monitor sodium intake. Follow up with PCP as scheduled.   Patient Instructions  Continue to use CPAP every night, minimum of 4-6 hours a night.  Change equipment every 30 days or as directed by DME. Wash your tubing with warm soap and water daily, hang to dry. Wash humidifier portion weekly.  Be aware of reduced alertness and do not drive or operate heavy machinery if experiencing this or drowsiness.  Exercise encouraged, as tolerated. Notify if persistent daytime sleepiness occurs even with consistent use of CPAP.   Continue lasix as directed by your primary care provider. Glad your swelling is better!    Wear compression stockings during the day to help with swelling. Elevate your legs when you are sitting   Follow up in 6 months with Dr. Craige Cotta or Philis Nettle. If symptoms worsen, please contact office for sooner follow up or seek emergency care.

## 2022-09-14 ENCOUNTER — Other Ambulatory Visit: Payer: Self-pay | Admitting: *Deleted

## 2022-09-14 DIAGNOSIS — I480 Paroxysmal atrial fibrillation: Secondary | ICD-10-CM

## 2022-09-14 MED ORDER — APIXABAN 5 MG PO TABS
5.0000 mg | ORAL_TABLET | Freq: Two times a day (BID) | ORAL | 5 refills | Status: DC
Start: 2022-09-14 — End: 2023-03-23

## 2022-09-14 NOTE — Telephone Encounter (Signed)
Eliquis 5mg  refill request received. Patient is 82 years old, weight-94.3kg, Crea-0.94 on 08/25/22, Diagnosis-Afib, and last seen by Rudi Coco on 05/13/22 & pending an appt with Otilio Saber on 10/26/22. Dose is appropriate based on dosing criteria. Will send in refill to requested pharmacy.

## 2022-09-15 ENCOUNTER — Ambulatory Visit: Payer: Medicare PPO | Admitting: Student

## 2022-09-21 ENCOUNTER — Ambulatory Visit (INDEPENDENT_AMBULATORY_CARE_PROVIDER_SITE_OTHER): Payer: Medicare PPO

## 2022-09-21 DIAGNOSIS — I480 Paroxysmal atrial fibrillation: Secondary | ICD-10-CM | POA: Diagnosis not present

## 2022-09-21 LAB — CUP PACEART REMOTE DEVICE CHECK
Date Time Interrogation Session: 20240517230108
Implantable Pulse Generator Implant Date: 20220218

## 2022-09-21 NOTE — Progress Notes (Signed)
Carelink Summary Report / Loop Recorder 

## 2022-10-19 NOTE — Progress Notes (Signed)
Carelink Summary Report / Loop Recorder 

## 2022-10-22 ENCOUNTER — Other Ambulatory Visit: Payer: Self-pay | Admitting: Family Medicine

## 2022-10-24 NOTE — Progress Notes (Unsigned)
  Electrophysiology Office Note:   Date:  10/26/2022  ID:  Brenda [name], [place] [birth year], MRN [id number]  Primary Cardiologist: None Electrophysiologist: Will Jorja Loa, MD      History of Present Illness:   Brenda [name] is a 82 y.o. female with h/o PAF, PACs, HTN, OSA, thoracic AA (36 mm last check, and chronic diasotlic CHF seen today for acute visit due to peripheral edema, that has also been managed by her PCP.    Patient reports well overall, though has had increased issues with peripheral edema. Now taking 40 mg lasix every day. She is very meticulous about salt. Had Salad with chopped burger (homemade, pepper only) for dinner last night, regular dressing. Does drink "at least" 4 16 oz glasses of water daily, plus 2 coffee cups, milk in her cereal, and potentially other drinks during the day. S denies chest pain, palpitations, dyspnea, PND, orthopnea, nausea, vomiting, dizziness, syncope, weight gain, or early satiety.   Review of systems complete and found to be negative unless listed in HPI.   Device History: Medtronic loop recorder implanted 2018, exchanged for [facility] 06/21/20 for  AF/PACs/PAFs   Studies Reviewed:    EKG is not ordered today. EKG from 05/13/2022 reviewed which showed NSR at 79 bpm     AF burden 0% by loop. ([facility])         Physical Exam:   VS:  BP 118/72   Pulse 76   Ht 5\' 3"  (1.6 m)   Wt 205 lb 6.4 oz (93.2 kg)   SpO2 97%   BMI 36.38 kg/m    Wt Readings from Last 3 Encounters:  10/26/22 205 lb 6.4 oz (93.2 kg)  09/10/22 208 lb (94.3 kg)  08/24/22 208 lb (94.3 kg)     GEN: Well nourished, well developed in no acute distress NECK: No JVD; No carotid bruits CARDIAC: Regular rate and rhythm, no murmurs, rubs, gallops RESPIRATORY:  Clear to auscultation without rales, wheezing or rhonchi  ABDOMEN: Soft, non-tender, non-distended EXTREMITIES:  No edema; No deformity   ASSESSMENT AND PLAN:    PAF/PACs  s/p Medtronic Loop recorder AF burden  0% by device Continue Eliquis for CHA2DS2/VASc of at least 4.     HTN Stable on current regimen   OSA  Encouraged nightly CPAP    Ascending thoracic aortic aneurysm  CT 05/2022 reviewed, Proximal aorta 3.6 cm Repeat study for continued annual monitoring.   Chronic diastolic CHF As previously discussed, suspect her edema is due more to salt and fluid intake.  She is drinking well over 2L daily. Encouraged fluid intake <2L daily. She will keep a diary over the next few days to get a handle on how much she is actually drinking, then cut back from there.  BNP has been negative We discussed updating Echo. Will defer to 6 mo follow up.   Follow up with EP APP in 6 months  Signed, Graciella Freer, PA-C

## 2022-10-26 ENCOUNTER — Ambulatory Visit (INDEPENDENT_AMBULATORY_CARE_PROVIDER_SITE_OTHER): Payer: Medicare PPO

## 2022-10-26 ENCOUNTER — Encounter: Payer: Self-pay | Admitting: Student

## 2022-10-26 ENCOUNTER — Ambulatory Visit: Payer: Medicare PPO | Attending: Physician Assistant | Admitting: Student

## 2022-10-26 VITALS — BP 118/72 | HR 76 | Ht 63.0 in | Wt 205.4 lb

## 2022-10-26 DIAGNOSIS — I480 Paroxysmal atrial fibrillation: Secondary | ICD-10-CM | POA: Diagnosis not present

## 2022-10-26 DIAGNOSIS — I7121 Aneurysm of the ascending aorta, without rupture: Secondary | ICD-10-CM | POA: Diagnosis not present

## 2022-10-26 DIAGNOSIS — G4733 Obstructive sleep apnea (adult) (pediatric): Secondary | ICD-10-CM | POA: Diagnosis not present

## 2022-10-26 DIAGNOSIS — I10 Essential (primary) hypertension: Secondary | ICD-10-CM

## 2022-10-26 LAB — CUP PACEART REMOTE DEVICE CHECK
Date Time Interrogation Session: 20240623230925
Implantable Pulse Generator Implant Date: 20220218

## 2022-10-26 NOTE — Patient Instructions (Signed)
Medication Instructions:  Your physician recommends that you continue on your current medications as directed. Please refer to the Current Medication list given to you today.  *If you need a refill on your cardiac medications before your next appointment, please call your pharmacy*  Lab Work: None ordered If you have labs (blood work) drawn today and your tests are completely normal, you will receive your results only by: MyChart Message (if you have MyChart) OR A paper copy in the mail If you have any lab test that is abnormal or we need to change your treatment, we will call you to review the results.  Follow-Up: At [facility], you and your health needs are our priority.  As part of our continuing mission to provide you with exceptional heart care, we have created designated Provider Care Teams.  These Care Teams include your primary Cardiologist (physician) and Advanced Practice Providers (APPs -  Physician Assistants and Nurse Practitioners) who all work together to provide you with the care you need, when you need it.  Your next appointment:   6 month(s)  Provider:   Michael "Andy" Tillery, PA-C  

## 2022-11-09 ENCOUNTER — Ambulatory Visit (INDEPENDENT_AMBULATORY_CARE_PROVIDER_SITE_OTHER): Payer: Medicare PPO

## 2022-11-09 VITALS — BP 120/84 | HR 85 | Temp 98.5°F | Wt 205.8 lb

## 2022-11-09 DIAGNOSIS — Z0000 Encounter for general adult medical examination without abnormal findings: Secondary | ICD-10-CM

## 2022-11-09 MED ORDER — FUROSEMIDE 20 MG PO TABS: ORAL_TABLET | ORAL | 3 refills | Status: DC

## 2022-11-09 NOTE — Progress Notes (Signed)
Subjective:   Brenda [name] is a 82 y.o. female who presents for Medicare Annual (Subsequent) preventive examination.  Visit Complete: In person   Review of Systems     Cardiac Risk Factors include: advanced age (>67men, >67 women);dyslipidemia;hypertension;obesity (BMI >30kg/m2)     Objective:    Today's Vitals   11/09/22 0929  BP: 120/84  Pulse: 85  Temp: 98.5 F (36.9 C)  SpO2: 96%  Weight: 205 lb 12.8 oz (93.4 kg)   Body mass index is 36.46 kg/m.     11/09/2022    9:40 AM 10/13/2021    8:40 AM 07/05/2020    8:09 AM 06/02/2019   12:26 PM 08/24/2018    9:58 AM 12/08/2016    8:31 AM  Advanced Directives  Does Patient Have a Medical Advance Directive? Yes Yes Yes Yes Yes Yes  Type of Estate agent of [place];Living will Living will Healthcare Power of [employer] Power of [place];Living will Healthcare Power of [place];Living will Living will  Does patient want to make changes to medical advance directive?    No - Patient declined  No - Patient declined  Copy of Healthcare Power of Attorney in Chart? No - copy requested  No - copy requested No - copy requested      Current Medications (verified) Outpatient Encounter Medications as of 11/09/2022  Medication Sig   apixaban (ELIQUIS) 5 MG TABS tablet Take 1 tablet (5 mg total) by mouth 2 (two) times daily.   atorvastatin (LIPITOR) 40 MG tablet TAKE 1 TABLET(40 MG) BY MOUTH DAILY   cholecalciferol (VITAMIN D3) 25 MCG (1000 UNIT) tablet Take 1,000 Units by mouth daily.   furosemide (LASIX) 20 MG tablet TAKE 1 TABLET(20 MG) BY MOUTH DAILY AS NEEDED FOR FLUID RETENTION   loratadine (CLARITIN) 10 MG tablet Take 10 mg by mouth daily as needed for allergies.   metoprolol succinate (TOPROL-XL) 25 MG 24 hr tablet TAKE 1 TABLET(25 MG) BY MOUTH DAILY   metroNIDAZOLE (METROGEL) 0.75 % gel Apply 1 Application topically 2 (two) times daily.   minoxidil (ROGAINE) 2 % external solution Apply topically 2  (two) times daily.   omeprazole (PRILOSEC) 20 MG capsule TAKE 1 CAPSULE(20 MG) BY MOUTH DAILY   vitamin B-12 (CYANOCOBALAMIN) 100 MCG tablet Take 100 mcg by mouth daily.   No facility-administered encounter medications on file as of 11/09/2022.    Allergies (verified) Patient has no known allergies.   History: Past Medical History:  Diagnosis Date   Atrial fibrillation (HCC)    Diverticulosis    Foot fracture, left 01/2020   broken heel spur    GERD (gastroesophageal reflux disease)    Hypertension    OSA (obstructive sleep apnea)    severe   Paroxysmal A-fib (HCC)    s/p afib ablation at [facility]   Past Surgical History:  Procedure Laterality Date   ATRIAL FIBRILLATION ABLATION     [employer]   CATARACT EXTRACTION, BILATERAL  2020   CHOLECYSTECTOMY  1977   COLON RESECTION  2005   due to diverticulitis   COLONOSCOPY     Multiple   implantable loop recorder implant  06/21/2020   Medtronic Reveal Linq model LNQ 22 (RLB [id number]) implantable loop recorder (with previously placed [id number] removed)   LOOP RECORDER INSERTION N/A 12/08/2016   Procedure: LOOP RECORDER INSERTION;  Surgeon: Hillis Range, MD;  Location: [facility];  Service: Cardiovascular;  Laterality: N/A;   TOTAL ABDOMINAL HYSTERECTOMY  1985   Family  History  Problem Relation Age of Onset   Stroke Mother    Parkinson's disease Father    Colon cancer Neg Hx    Esophageal cancer Neg Hx    Pancreatic cancer Neg Hx    Stomach cancer Neg Hx    Liver disease Neg Hx    Social History   Socioeconomic History   Marital status: Divorced    Spouse name: Not on file   Number of children: Not on file   Years of education: Not on file   Highest education level: Not on file  Occupational History   Occupation: retired   Tobacco Use   Smoking status: Former    Packs/day: 0.50    Years: 48.00    Additional pack years: 0.00    Total pack years: 24.00    Types: Cigarettes    Start date: 73    Quit date:  07/03/2006    Years since quitting: 16.3   Smokeless tobacco: Never  Vaping Use   Vaping Use: Never used  Substance and Sexual Activity   Alcohol use: Yes    Alcohol/week: 1.0 standard drink of alcohol    Types: 1 Glasses of wine per week    Comment: occasion   Drug use: No   Sexual activity: Never  Other Topics Concern   Not on file  Social History Narrative   2018 moved to [place] from [place]   Retired Tourist information centre manager   3 daughters daughter in [place] is a physical therapist   2 cups of coffee a day no tobacco now though former smoker no drug use   Social Determinants of Corporate investment banker Strain: Low Risk  (11/09/2022)   Overall Financial Resource Strain (CARDIA)    Difficulty of Paying Living Expenses: Not hard at all  Food Insecurity: No Food Insecurity (11/09/2022)   Hunger Vital Sign    Worried About Running Out of Food in the Last Year: Never true    Ran Out of Food in the Last Year: Never true  Transportation Needs: No Transportation Needs (11/09/2022)   PRAPARE - Administrator, Civil Service (Medical): No    Lack of Transportation (Non-Medical): No  Physical Activity: Sufficiently Active (11/09/2022)   Exercise Vital Sign    Days of Exercise per Week: 5 days    Minutes of Exercise per Session: 30 min  Stress: No Stress Concern Present (11/09/2022)   [employer] of Occupational Health - Occupational Stress Questionnaire    Feeling of Stress : Not at all  Social Connections: Moderately Isolated (11/09/2022)   Social Connection and Isolation Panel [NHANES]    Frequency of Communication with Friends and Family: More than three times a week    Frequency of Social Gatherings with Friends and Family: More than three times a week    Attends Religious Services: More than 4 times per year    Active Member of [employer] or Organizations: No    Attends Engineer, structural: Never    Marital Status: Divorced    Tobacco Counseling Counseling  given: Not Answered   Clinical Intake:  Pre-visit preparation completed: Yes  Pain : No/denies pain     BMI - recorded: 36.46 Nutritional Status: BMI > 30  Obese Nutritional Risks: None Diabetes: No  How often do you need to have someone help you when you read instructions, pamphlets, or other written materials from your doctor or pharmacy?: 1 - Never  Interpreter Needed?: No  Information entered  by :: Lanier Ensign, LPn   Activities of Daily Living    11/09/2022    9:37 AM  In your present state of health, do you have any difficulty performing the following activities:  Hearing? 0  Vision? 0  Difficulty concentrating or making decisions? 0  Walking or climbing stairs? 0  Dressing or bathing? 0  Doing errands, shopping? 0  Preparing Food and eating ? N  Using the Toilet? N  In the past six months, have you accidently leaked urine? N  Do you have problems with loss of bowel control? N  Managing your Medications? N  Managing your Finances? N  Housekeeping or managing your Housekeeping? N    Patient Care Team: Ardith Dark, MD as PCP - General (Family Medicine) Regan Lemming, MD as PCP - Electrophysiology (Cardiology) Hillis Range, MD (Inactive) as Consulting Physician (Cardiology) [name], MD as Consulting Physician (Pulmonary Disease) Rodrigo Ran, OD as Consulting Physician (Ophthalmology) Bjorn Pippin, MD as Consulting Physician (Orthopedic Surgery)  Indicate any recent Medical Services you may have received from other than [facility] providers in the past year (date may be approximate).     Assessment:   This is a routine wellness examination for Almendra.  Hearing/Vision screen Hearing Screening - Comments:: Pt denies  any hearing issues  Vision Screening - Comments:: Pt encouraged to follow up with Dr Parke Simmers for annual eye exams   Dietary issues and exercise activities discussed:     Goals Addressed             This Visit's  Progress    Patient Stated       Continue to maintain good health        Depression Screen    11/09/2022    9:39 AM 08/24/2022    8:22 AM 05/28/2022   10:36 AM 02/03/2022    8:15 AM 10/13/2021    8:39 AM 02/24/2021   12:50 PM 07/05/2020    8:08 AM  PHQ 2/9 Scores  PHQ - 2 Score 0 0 0 0 0 0 0    Fall Risk    11/09/2022    9:41 AM 08/24/2022    8:22 AM 05/28/2022   10:36 AM 02/03/2022    8:15 AM 10/13/2021    8:42 AM  Fall Risk   Falls in the past year? 0 0 0 0 1  Number falls in past yr: 0  0 1 1  Injury with Fall? 0 0 0 0 1  Comment     fell on the side walk with bruises  Risk for fall due to : Impaired vision No Fall Risks No Fall Risks No Fall Risks Impaired vision  Follow up Falls prevention discussed    Falls prevention discussed    MEDICARE RISK AT HOME:  Medicare Risk at Home - 11/09/22 0942     Any stairs in or around the home? No    If so, are there any without handrails? No    Home free of loose throw rugs in walkways, pet beds, electrical cords, etc? Yes    Adequate lighting in your home to reduce risk of falls? Yes    Life alert? No    Use of a cane, walker or w/c? Yes    Grab bars in the bathroom? Yes    Shower chair or bench in shower? No    Elevated toilet seat or a handicapped toilet? No  TIMED UP AND GO:  Was the test performed?  Yes  Length of time to ambulate 10 feet: 10 sec Gait slow and steady without use of assistive device    Cognitive Function:        11/09/2022    9:42 AM 10/13/2021    8:44 AM 07/05/2020    8:13 AM 06/02/2019   12:27 PM  6CIT Screen  What Year? 0 points 0 points 0 points 0 points  What month? 0 points 0 points 0 points 0 points  What time? 0 points 0 points  0 points  Count back from 20 0 points 0 points 0 points 0 points  Months in reverse 0 points 0 points 0 points 0 points  Repeat phrase 0 points 0 points 0 points 0 points  Total Score 0 points 0 points  0 points    Immunizations Immunization History   Administered Date(s) Administered   COVID-19, mRNA, vaccine(Comirnaty)12 years and older 01/26/2022   Fluad Quad(high Dose 65+) 02/06/2019   Influenza Whole 02/15/2017   Influenza, High Dose Seasonal PF 01/22/2017   Influenza,inj,Quad PF,6+ Mos 02/14/2018   Influenza-Unspecified 02/14/2018, 03/29/2020, 02/10/2021, 01/26/2022   PFIZER(Purple Top)SARS-COV-2 Vaccination 05/19/2019, 06/09/2019, 03/29/2020   Pneumococcal Conjugate-13 02/06/2019   Pneumococcal Polysaccharide-23 08/28/2020   Tdap 01/09/2018    TDAP status: Up to date  Flu Vaccine status: Up to date  Pneumococcal vaccine status: Up to date  Covid-19 vaccine status: Completed vaccines  Qualifies for Shingles Vaccine? No   Screening Tests Health Maintenance  Topic Date Due   INFLUENZA VACCINE  12/03/2022   DEXA SCAN  03/08/2023   Medicare Annual Wellness (AWV)  11/09/2023   DTaP/Tdap/Td (2 - Td or Tdap) 01/10/2028   Pneumonia Vaccine 75+ Years old  Completed   COVID-19 Vaccine  Completed   HPV VACCINES  Aged Out   MAMMOGRAM  Discontinued   Hepatitis C Screening  Discontinued   Zoster Vaccines- Shingrix  Discontinued    Health Maintenance  There are no preventive care reminders to display for this patient.   Colorectal cancer screening: No longer required.   Mammogram status: No longer required due to age .  Bone Density status: Completed 03/07/21. Results reflect: Bone density results: OSTEOPENIA. Repeat every 2 years.   Additional Screening:  Hepatitis C Screening: Completed 02/03/22  Vision Screening: Recommended annual ophthalmology exams for early detection of glaucoma and other disorders of the eye. Is the patient up to date with their annual eye exam?  No  Who is the provider or what is the name of the office in which the patient attends annual eye exams? Dr Parke Simmers  If pt is not established with a provider, would they like to be referred to a provider to establish care? No .   Dental Screening:  Recommended annual dental exams for proper oral hygiene   Community Resource Referral / Chronic Care Management: CRR required this visit?  No   CCM required this visit?  No     Plan:     I have personally reviewed and noted the following in the patient's chart:   Medical and social history Use of alcohol, tobacco or illicit drugs  Current medications and supplements including opioid prescriptions. Patient is not currently taking opioid prescriptions. Functional ability and status Nutritional status Physical activity Advanced directives List of other physicians Hospitalizations, surgeries, and ER visits in previous 12 months Vitals Screenings to include cognitive, depression, and falls Referrals and appointments  In addition, I have reviewed and  discussed with patient certain preventive protocols, quality metrics, and best practice recommendations. A written personalized care plan for preventive services as well as general preventive health recommendations were provided to patient.     Marzella Schlein, LPN   05/09/1094   After Visit Summary: (MyChart) Due to this being a telephonic visit, the after visit summary with patients personalized plan was offered to patient via MyChart   Nurse Notes: Pt stated she was told to use up to 40 mg of Lasix as needed for edema and is requesting a new prescription. Pt stated she only uses 40 mg a few times a week which causes her to run out early  of the 20 mg please advise pt is an my chart user as well.

## 2022-11-09 NOTE — Addendum Note (Signed)
Addended by: Ardith Dark on: 11/09/2022 03:10 PM   Modules accepted: Orders

## 2022-11-09 NOTE — Patient Instructions (Signed)
Brenda [name] , Thank you for taking time to come for your Medicare Wellness Visit. I appreciate your ongoing commitment to your health goals. Please review the following plan we discussed and let me know if I can assist you in the future.   These are the goals we discussed:  Goals      Patient Stated     Losing weight      Patient Stated     None at this time         This is a list of the screening recommended for you and due dates:  Health Maintenance  Topic Date Due   Medicare Annual Wellness Visit  10/14/2022   Mammogram  02/04/2023*   Flu Shot  12/03/2022   DEXA scan (bone density measurement)  03/08/2023   DTaP/Tdap/Td vaccine (2 - Td or Tdap) 01/10/2028   Pneumonia Vaccine  Completed   COVID-19 Vaccine  Completed   HPV Vaccine  Aged Out   Hepatitis C Screening  Discontinued   Zoster (Shingles) Vaccine  Discontinued  *Topic was postponed. The date shown is not the original due date.    Advanced directives: Please bring a copy of your health care power of attorney and living will to the office at your convenience.  Conditions/risks identified: continue to maintain good health   Next appointment: Follow up in one year for your annual wellness visit    Preventive Care 65 Years and Older, Female Preventive care refers to lifestyle choices and visits with your health care provider that can promote health and wellness. What does preventive care include? A yearly physical exam. This is also called an annual well check. Dental exams once or twice a year. Routine eye exams. Ask your health care provider how often you should have your eyes checked. Personal lifestyle choices, including: Daily care of your teeth and gums. Regular physical activity. Eating a healthy diet. Avoiding tobacco and drug use. Limiting alcohol use. Practicing safe sex. Taking low-dose aspirin every day. Taking vitamin and mineral supplements as recommended by your health care provider. What happens  during an annual well check? The services and screenings done by your health care provider during your annual well check will depend on your age, overall health, lifestyle risk factors, and family history of disease. Counseling  Your health care provider may ask you questions about your: Alcohol use. Tobacco use. Drug use. Emotional well-being. Home and relationship well-being. Sexual activity. Eating habits. History of falls. Memory and ability to understand (cognition). Work and work Astronomer. Reproductive health. Screening  You may have the following tests or measurements: Height, weight, and BMI. Blood pressure. Lipid and cholesterol levels. These may be checked every 5 years, or more frequently if you are over 24 years old. Skin check. Lung cancer screening. You may have this screening every year starting at age 34 if you have a 30-pack-year history of smoking and currently smoke or have quit within the past 15 years. Fecal occult blood test (FOBT) of the stool. You may have this test every year starting at age 37. Flexible sigmoidoscopy or colonoscopy. You may have a sigmoidoscopy every 5 years or a colonoscopy every 10 years starting at age 32. Hepatitis C blood test. Hepatitis B blood test. Sexually transmitted disease (STD) testing. Diabetes screening. This is done by checking your blood sugar (glucose) after you have not eaten for a while (fasting). You may have this done every 1-3 years. Bone density scan. This is done to screen for osteoporosis.  You may have this done starting at age 66. Mammogram. This may be done every 1-2 years. Talk to your health care provider about how often you should have regular mammograms. Talk with your health care provider about your test results, treatment options, and if necessary, the need for more tests. Vaccines  Your health care provider may recommend certain vaccines, such as: Influenza vaccine. This is recommended every  year. Tetanus, diphtheria, and acellular pertussis (Tdap, Td) vaccine. You may need a Td booster every 10 years. Zoster vaccine. You may need this after age 82. Pneumococcal 13-valent conjugate (PCV13) vaccine. One dose is recommended after age 31. Pneumococcal polysaccharide (PPSV23) vaccine. One dose is recommended after age 67. Talk to your health care provider about which screenings and vaccines you need and how often you need them. This information is not intended to replace advice given to you by your health care provider. Make sure you discuss any questions you have with your health care provider. Document Released: 05/17/2015 Document Revised: 01/08/2016 Document Reviewed: 02/19/2015 Elsevier Interactive Patient Education  2017 [employer].  Fall Prevention in the Home Falls can cause injuries. They can happen to people of all ages. There are many things you can do to make your home safe and to help prevent falls. What can I do on the outside of my home? Regularly fix the edges of walkways and driveways and fix any cracks. Remove anything that might make you trip as you walk through a door, such as a raised step or threshold. Trim any bushes or trees on the path to your home. Use bright outdoor lighting. Clear any walking paths of anything that might make someone trip, such as rocks or tools. Regularly check to see if handrails are loose or broken. Make sure that both sides of any steps have handrails. Any raised decks and porches should have guardrails on the edges. Have any leaves, snow, or ice cleared regularly. Use sand or salt on walking paths during winter. Clean up any spills in your garage right away. This includes oil or grease spills. What can I do in the bathroom? Use night lights. Install grab bars by the toilet and in the tub and shower. Do not use towel bars as grab bars. Use non-skid mats or decals in the tub or shower. If you need to sit down in the shower, use a  plastic, non-slip stool. Keep the floor dry. Clean up any water that spills on the floor as soon as it happens. Remove soap buildup in the tub or shower regularly. Attach bath mats securely with double-sided non-slip rug tape. Do not have throw rugs and other things on the floor that can make you trip. What can I do in the bedroom? Use night lights. Make sure that you have a light by your bed that is easy to reach. Do not use any sheets or blankets that are too big for your bed. They should not hang down onto the floor. Have a firm chair that has side arms. You can use this for support while you get dressed. Do not have throw rugs and other things on the floor that can make you trip. What can I do in the kitchen? Clean up any spills right away. Avoid walking on wet floors. Keep items that you use a lot in easy-to-reach places. If you need to reach something above you, use a strong step stool that has a grab bar. Keep electrical cords out of the way. Do not use  floor polish or wax that makes floors slippery. If you must use wax, use non-skid floor wax. Do not have throw rugs and other things on the floor that can make you trip. What can I do with my stairs? Do not leave any items on the stairs. Make sure that there are handrails on both sides of the stairs and use them. Fix handrails that are broken or loose. Make sure that handrails are as long as the stairways. Check any carpeting to make sure that it is firmly attached to the stairs. Fix any carpet that is loose or worn. Avoid having throw rugs at the top or bottom of the stairs. If you do have throw rugs, attach them to the floor with carpet tape. Make sure that you have a light switch at the top of the stairs and the bottom of the stairs. If you do not have them, ask someone to add them for you. What else can I do to help prevent falls? Wear shoes that: Do not have high heels. Have rubber bottoms. Are comfortable and fit you  well. Are closed at the toe. Do not wear sandals. If you use a stepladder: Make sure that it is fully opened. Do not climb a closed stepladder. Make sure that both sides of the stepladder are locked into place. Ask someone to hold it for you, if possible. Clearly mark and make sure that you can see: Any grab bars or handrails. First and last steps. Where the edge of each step is. Use tools that help you move around (mobility aids) if they are needed. These include: Canes. Walkers. Scooters. Crutches. Turn on the lights when you go into a dark area. Replace any light bulbs as soon as they burn out. Set up your furniture so you have a clear path. Avoid moving your furniture around. If any of your floors are uneven, fix them. If there are any pets around you, be aware of where they are. Review your medicines with your doctor. Some medicines can make you feel dizzy. This can increase your chance of falling. Ask your doctor what other things that you can do to help prevent falls. This information is not intended to replace advice given to you by your health care provider. Make sure you discuss any questions you have with your health care provider. Document Released: 02/14/2009 Document Revised: 09/26/2015 Document Reviewed: 05/25/2014 Elsevier Interactive Patient Education  2017 [employer].

## 2022-11-12 DIAGNOSIS — L814 Other melanin hyperpigmentation: Secondary | ICD-10-CM | POA: Diagnosis not present

## 2022-11-12 DIAGNOSIS — L298 Other pruritus: Secondary | ICD-10-CM | POA: Diagnosis not present

## 2022-11-12 DIAGNOSIS — L820 Inflamed seborrheic keratosis: Secondary | ICD-10-CM | POA: Diagnosis not present

## 2022-11-12 DIAGNOSIS — Z789 Other specified health status: Secondary | ICD-10-CM | POA: Diagnosis not present

## 2022-11-12 DIAGNOSIS — L570 Actinic keratosis: Secondary | ICD-10-CM | POA: Diagnosis not present

## 2022-11-12 DIAGNOSIS — L538 Other specified erythematous conditions: Secondary | ICD-10-CM | POA: Diagnosis not present

## 2022-11-12 DIAGNOSIS — L718 Other rosacea: Secondary | ICD-10-CM | POA: Diagnosis not present

## 2022-11-13 NOTE — Progress Notes (Signed)
Carelink Summary Report / Loop Recorder 

## 2022-11-17 ENCOUNTER — Other Ambulatory Visit: Payer: Self-pay | Admitting: Family Medicine

## 2022-11-30 ENCOUNTER — Ambulatory Visit (INDEPENDENT_AMBULATORY_CARE_PROVIDER_SITE_OTHER): Payer: Medicare PPO

## 2022-11-30 DIAGNOSIS — I480 Paroxysmal atrial fibrillation: Secondary | ICD-10-CM | POA: Diagnosis not present

## 2022-12-11 ENCOUNTER — Telehealth: Payer: Self-pay

## 2022-12-11 NOTE — Telephone Encounter (Signed)
ILR checked and shows no episodes since January 2024. Patient called and advised to f/u with PCP. Voiced understanding.

## 2022-12-11 NOTE — Telephone Encounter (Signed)
Pt called in stating that she has not felt well today she feels weak and her bp has been high. Pt wants to know if anything was seen on her transmission before she calls her pcp

## 2022-12-16 ENCOUNTER — Telehealth: Payer: Self-pay | Admitting: Family Medicine

## 2022-12-16 NOTE — Telephone Encounter (Signed)
Patient requests new RX for furosemide (LASIX) 20 MG tablet with dosage instructions to states Take 2 tablets as needed (otherwise-unable to get refills because current instruction is take 1 tablet as needed-therefore, Patient is running out of medication). States insurance will not cover until after 12/24/22.  Requests RX for above medication with increased dosage instructions be sent to:  [id number] [place], [place] - [address] AT [facility] [address] & [facility] [address] Phone: [phone]  Fax: [phone]

## 2022-12-16 NOTE — Progress Notes (Signed)
Carelink Summary Report / Loop Recorder 

## 2022-12-17 ENCOUNTER — Other Ambulatory Visit: Payer: Self-pay | Admitting: *Deleted

## 2022-12-17 MED ORDER — FUROSEMIDE 20 MG PO TABS: ORAL_TABLET | ORAL | 3 refills | Status: DC

## 2022-12-17 NOTE — Telephone Encounter (Signed)
Prescription send to pharmacy.

## 2022-12-31 LAB — CUP PACEART REMOTE DEVICE CHECK
Date Time Interrogation Session: 20240828230151
Implantable Pulse Generator Implant Date: 20220218

## 2023-01-05 ENCOUNTER — Ambulatory Visit (INDEPENDENT_AMBULATORY_CARE_PROVIDER_SITE_OTHER): Payer: Medicare PPO

## 2023-01-05 DIAGNOSIS — I480 Paroxysmal atrial fibrillation: Secondary | ICD-10-CM

## 2023-01-13 NOTE — Progress Notes (Signed)
Carelink Summary Report / Loop Recorder 

## 2023-02-02 LAB — CUP PACEART REMOTE DEVICE CHECK
Date Time Interrogation Session: 20240930231153
Implantable Pulse Generator Implant Date: 20220218

## 2023-02-04 DIAGNOSIS — G4733 Obstructive sleep apnea (adult) (pediatric): Secondary | ICD-10-CM | POA: Diagnosis not present

## 2023-02-05 ENCOUNTER — Encounter: Payer: Medicare PPO | Admitting: Family Medicine

## 2023-02-08 ENCOUNTER — Ambulatory Visit (INDEPENDENT_AMBULATORY_CARE_PROVIDER_SITE_OTHER): Payer: Medicare PPO

## 2023-02-08 DIAGNOSIS — I480 Paroxysmal atrial fibrillation: Secondary | ICD-10-CM

## 2023-02-18 ENCOUNTER — Ambulatory Visit (INDEPENDENT_AMBULATORY_CARE_PROVIDER_SITE_OTHER): Payer: Medicare PPO | Admitting: Family Medicine

## 2023-02-18 ENCOUNTER — Encounter: Payer: Self-pay | Admitting: Family Medicine

## 2023-02-18 VITALS — BP 125/74 | HR 79 | Temp 97.2°F | Ht 63.0 in | Wt 206.8 lb

## 2023-02-18 DIAGNOSIS — I480 Paroxysmal atrial fibrillation: Secondary | ICD-10-CM | POA: Diagnosis not present

## 2023-02-18 DIAGNOSIS — I10 Essential (primary) hypertension: Secondary | ICD-10-CM

## 2023-02-18 DIAGNOSIS — E538 Deficiency of other specified B group vitamins: Secondary | ICD-10-CM | POA: Diagnosis not present

## 2023-02-18 DIAGNOSIS — Z0001 Encounter for general adult medical examination with abnormal findings: Secondary | ICD-10-CM | POA: Diagnosis not present

## 2023-02-18 DIAGNOSIS — R600 Localized edema: Secondary | ICD-10-CM

## 2023-02-18 DIAGNOSIS — E559 Vitamin D deficiency, unspecified: Secondary | ICD-10-CM | POA: Diagnosis not present

## 2023-02-18 DIAGNOSIS — R739 Hyperglycemia, unspecified: Secondary | ICD-10-CM

## 2023-02-18 DIAGNOSIS — E785 Hyperlipidemia, unspecified: Secondary | ICD-10-CM

## 2023-02-18 LAB — LIPID PANEL
Cholesterol: 139 mg/dL (ref 0–200)
HDL: 69.5 mg/dL (ref >39.00)
LDL Cholesterol: 52 mg/dL (ref 0–99)
NonHDL: 69.72
Total CHOL/HDL Ratio: 2
Triglycerides: 88 mg/dL (ref 0.0–149.0)
VLDL: 17.6 mg/dL (ref 0.0–40.0)

## 2023-02-18 LAB — COMPREHENSIVE METABOLIC PANEL
ALT: 19 U/L (ref 0–35)
AST: 21 U/L (ref 0–37)
Albumin: 4.2 g/dL (ref 3.5–5.2)
Alkaline Phosphatase: 93 U/L (ref 39–117)
BUN: 15 mg/dL (ref 6–23)
CO2: 29 meq/L (ref 19–32)
Calcium: 9.5 mg/dL (ref 8.4–10.5)
Chloride: 101 meq/L (ref 96–112)
Creatinine, Ser: 0.87 mg/dL (ref 0.40–1.20)
GFR: 62.78 mL/min (ref >60.00)
Glucose, Bld: 90 mg/dL (ref 70–99)
Potassium: 3.2 meq/L — ABNORMAL LOW (ref 3.5–5.1)
Sodium: 142 meq/L (ref 135–145)
Total Bilirubin: 0.7 mg/dL (ref 0.2–1.2)
Total Protein: 7.2 g/dL (ref 6.0–8.3)

## 2023-02-18 LAB — CBC
HCT: 42.6 % (ref 36.0–46.0)
Hemoglobin: 14 g/dL (ref 12.0–15.0)
MCHC: 32.8 g/dL (ref 30.0–36.0)
MCV: 93.3 fL (ref 78.0–100.0)
Platelets: 207 10*3/uL (ref 150.0–400.0)
RBC: 4.57 Mil/uL (ref 3.87–5.11)
RDW: 14.5 % (ref 11.5–15.5)
WBC: 6.2 10*3/uL (ref 4.0–10.5)

## 2023-02-18 LAB — VITAMIN B12: Vitamin B-12: 476 pg/mL (ref 211–911)

## 2023-02-18 LAB — VITAMIN D 25 HYDROXY (VIT D DEFICIENCY, FRACTURES): VITD: 32.14 ng/mL (ref 30.00–100.00)

## 2023-02-18 LAB — TSH: TSH: 3.73 u[IU]/mL (ref 0.35–5.50)

## 2023-02-18 LAB — HEMOGLOBIN A1C: Hgb A1c MFr Bld: 5.7 % (ref 4.6–6.5)

## 2023-02-18 MED ORDER — FUROSEMIDE 20 MG PO TABS: 20.0000 mg | ORAL_TABLET | Freq: Every day | ORAL | 3 refills | Status: AC | PRN

## 2023-02-18 NOTE — Assessment & Plan Note (Signed)
Regular rate and rhythm.  Anticoagulated on Eliquis and rate controlled on metoprolol.

## 2023-02-18 NOTE — Assessment & Plan Note (Signed)
Stable on Lasix 20 to 40 mg daily.  Will refill this today.  Check labs.  We discussed conservative measures including salt avoidance, compression stockings, and leg elevation as well.

## 2023-02-18 NOTE — Assessment & Plan Note (Signed)
Check A1c 

## 2023-02-18 NOTE — Assessment & Plan Note (Signed)
Check vitamin D.

## 2023-02-18 NOTE — Progress Notes (Signed)
Chief Complaint:  Brenda [name] is a 82 y.o. female who presents today for her annual comprehensive physical exam.    Assessment/Plan:  Chronic Problems Addressed Today: Paroxysmal A-fib (HCC) Regular rate and rhythm.  Anticoagulated on Eliquis and rate controlled on metoprolol.  Dyslipidemia Check lipids continue Lipitor 40 mg daily.  Vitamin D deficiency Check vitamin D.  B12 deficiency Check B12.  Hyperglycemia Check A1c.  Hypertension Blood pressure at goal on metoprolol succinate 25 mg daily.  Bilateral lower extremity edema Stable on Lasix 20 to 40 mg daily.  Will refill this today.  Check labs.  We discussed conservative measures including salt avoidance, compression stockings, and leg elevation as well.  Preventative Healthcare: Check labs.  She will be getting shingles vaccine at the pharmacy.  Patient Counseling(The following topics were reviewed and/or handout was given):  -Nutrition: Stressed importance of moderation in sodium/caffeine intake, saturated fat and cholesterol, caloric balance, sufficient intake of fresh fruits, vegetables, and fiber.  -Stressed the importance of regular exercise.   -Substance Abuse: Discussed cessation/primary prevention of tobacco, alcohol, or other drug use; driving or other dangerous activities under the influence; availability of treatment for abuse.   -Injury prevention: Discussed safety belts, safety helmets, smoke detector, smoking near bedding or upholstery.   -Sexuality: Discussed sexually transmitted diseases, partner selection, use of condoms, avoidance of unintended pregnancy and contraceptive alternatives.   -Dental health: Discussed importance of regular tooth brushing, flossing, and dental visits.  -Health maintenance and immunizations reviewed. Please refer to Health maintenance section.  Return to care in 1 year for next preventative visit.     Subjective:  HPI:  She has no acute complaints today. See  Assessment / plan for status of chronic conditions.   Lifestyle Diet: Balanced. Plenty of fruits and vegetables.  Exercise: Doing a lot of moving and walking.      02/18/2023   11:01 AM  Depression screen PHQ 2/9  Decreased Interest 0  Down, Depressed, Hopeless 0  PHQ - 2 Score 0    There are no preventive care reminders to display for this patient.    ROS: Per HPI, otherwise a complete review of systems was negative.   PMH:  The following were reviewed and entered/updated in epic: Past Medical History:  Diagnosis Date   Atrial fibrillation (HCC)    Diverticulosis    Foot fracture, left 01/2020   broken heel spur    GERD (gastroesophageal reflux disease)    Hypertension    OSA (obstructive sleep apnea)    severe   Paroxysmal A-fib (HCC)    s/p afib ablation at [facility]   Patient Active Problem List   Diagnosis Date Noted   Rosacea 08/24/2022   Bilateral lower extremity edema 08/20/2022   Postviral fatigue syndrome 02/20/2022   Osteopenia last DEXA 2022 03/07/2021   Right foot pain 02/24/2021   Hyperglycemia 08/28/2020   Vitamin D deficiency 06/05/2019   B12 deficiency 06/05/2019   Seasonal allergies 10/24/2018   Dyslipidemia 10/24/2018   Osteoarthritis 07/06/2017   Paroxysmal A-fib (HCC)    OSA (obstructive sleep apnea)    Hypertension    GERD (gastroesophageal reflux disease)    Diverticulosis    Past Surgical History:  Procedure Laterality Date   ATRIAL FIBRILLATION ABLATION     [employer]   CATARACT EXTRACTION, BILATERAL  2020   CHOLECYSTECTOMY  1977   COLON RESECTION  2005   due to diverticulitis   COLONOSCOPY     Multiple   implantable  loop recorder implant  06/21/2020   Medtronic Reveal Linq model LNQ 22 (RLB [id number]) implantable loop recorder (with previously placed LINQ1 removed)   LOOP RECORDER INSERTION N/A 12/08/2016   Procedure: LOOP RECORDER INSERTION;  Surgeon: Hillis Range, MD;  Location: [facility];  Service:  Cardiovascular;  Laterality: N/A;   TOTAL ABDOMINAL HYSTERECTOMY  1985    Family History  Problem Relation Age of Onset   Stroke Mother    Parkinson's disease Father    Colon cancer Neg Hx    Esophageal cancer Neg Hx    Pancreatic cancer Neg Hx    Stomach cancer Neg Hx    Liver disease Neg Hx     Medications- reviewed and updated Current Outpatient Medications  Medication Sig Dispense Refill   apixaban (ELIQUIS) 5 MG TABS tablet Take 1 tablet (5 mg total) by mouth 2 (two) times daily. 60 tablet 5   atorvastatin (LIPITOR) 40 MG tablet TAKE 1 TABLET(40 MG) BY MOUTH DAILY 90 tablet 3   cholecalciferol (VITAMIN D3) 25 MCG (1000 UNIT) tablet Take 1,000 Units by mouth daily.     loratadine (CLARITIN) 10 MG tablet Take 10 mg by mouth daily as needed for allergies.     metoprolol succinate (TOPROL-XL) 25 MG 24 hr tablet TAKE 1 TABLET(25 MG) BY MOUTH DAILY 90 tablet 3   metroNIDAZOLE (METROGEL) 0.75 % gel Apply 1 Application topically 2 (two) times daily. 45 g 0   minoxidil (ROGAINE) 2 % external solution Apply topically 2 (two) times daily.     omeprazole (PRILOSEC) 20 MG capsule TAKE 1 CAPSULE(20 MG) BY MOUTH DAILY 90 capsule 3   vitamin B-12 (CYANOCOBALAMIN) 100 MCG tablet Take 100 mcg by mouth daily.     furosemide (LASIX) 20 MG tablet Take 1-2 tablets (20-40 mg total) by mouth daily as needed. 180 tablet 3   No current facility-administered medications for this visit.    Allergies-reviewed and updated No Known Allergies  Social History   Socioeconomic History   Marital status: Divorced    Spouse name: Not on file   Number of children: Not on file   Years of education: Not on file   Highest education level: Not on file  Occupational History   Occupation: retired   Tobacco Use   Smoking status: Former    Current packs/day: 0.00    Average packs/day: 0.5 packs/day for 48.2 years (24.1 ttl pk-yrs)    Types: Cigarettes    Start date: 2    Quit date: 07/03/2006    Years  since quitting: 16.6   Smokeless tobacco: Never  Vaping Use   Vaping status: Never Used  Substance and Sexual Activity   Alcohol use: Yes    Alcohol/week: 1.0 standard drink of alcohol    Types: 1 Glasses of wine per week    Comment: occasion   Drug use: No   Sexual activity: Never  Other Topics Concern   Not on file  Social History Narrative   2018 moved to [place] from [place]   Retired Tourist information centre manager   3 daughters daughter in [place] is a physical therapist   2 cups of coffee a day no tobacco now though former smoker no drug use   Social Determinants of Corporate investment banker Strain: Low Risk  (11/09/2022)   Overall Financial Resource Strain (CARDIA)    Difficulty of Paying Living Expenses: Not hard at all  Food Insecurity: No Food Insecurity (11/09/2022)   Hunger Vital Sign  Worried About Programme researcher, broadcasting/film/video in the Last Year: Never true    Ran Out of Food in the Last Year: Never true  Transportation Needs: No Transportation Needs (11/09/2022)   PRAPARE - Administrator, Civil Service (Medical): No    Lack of Transportation (Non-Medical): No  Physical Activity: Sufficiently Active (11/09/2022)   Exercise Vital Sign    Days of Exercise per Week: 5 days    Minutes of Exercise per Session: 30 min  Stress: No Stress Concern Present (11/09/2022)   [employer] of Occupational Health - Occupational Stress Questionnaire    Feeling of Stress : Not at all  Social Connections: Moderately Isolated (11/09/2022)   Social Connection and Isolation Panel [NHANES]    Frequency of Communication with Friends and Family: More than three times a week    Frequency of Social Gatherings with Friends and Family: More than three times a week    Attends Religious Services: More than 4 times per year    Active Member of [employer] or Organizations: No    Attends Engineer, structural: Never    Marital Status: Divorced        Objective:  Physical Exam: BP 125/74    Pulse 79   Temp (!) 97.2 F (36.2 C) (Temporal)   Ht 5\' 3"  (1.6 m)   Wt 206 lb 12.8 oz (93.8 kg)   SpO2 96%   BMI 36.63 kg/m   Body mass index is 36.63 kg/m. Wt Readings from Last 3 Encounters:  02/18/23 206 lb 12.8 oz (93.8 kg)  11/09/22 205 lb 12.8 oz (93.4 kg)  10/26/22 205 lb 6.4 oz (93.2 kg)   Gen: NAD, resting comfortably HEENT: TMs normal bilaterally. OP clear. No thyromegaly noted.  CV: RRR with no murmurs appreciated Pulm: NWOB, CTAB with no crackles, wheezes, or rhonchi GI: Normal bowel sounds present. Soft, Nontender, Nondistended. MSK: no edema, cyanosis, or clubbing noted Skin: warm, dry Neuro: CN2-12 grossly intact. Strength 5/5 in upper and lower extremities. Reflexes symmetric and intact bilaterally.  Psych: Normal affect and thought content     Bryant Saye M. Jimmey Ralph, MD 02/18/2023 11:27 AM

## 2023-02-18 NOTE — Assessment & Plan Note (Signed)
Blood pressure at goal on metoprolol succinate 25 mg daily.

## 2023-02-18 NOTE — Assessment & Plan Note (Signed)
Check lipids continue Lipitor 40 mg daily.

## 2023-02-18 NOTE — Assessment & Plan Note (Signed)
Check B12 

## 2023-02-18 NOTE — Patient Instructions (Addendum)
It was very nice to see you today!  We will check blood work today.  I will refill your Lasix.  Please continue to work on diet and exercise.  Return in about 1 year (around 02/18/2024) for Annual Physical.   Take care, Dr Jimmey Ralph  PLEASE NOTE:  If you had any lab tests, please let us know if you have not heard back within a few days. You may see your results on mychart before we have a chance to review them but we will give you a call once they are reviewed by [place].   If we ordered any referrals today, please let us know if you have not heard from their office within the next week.   If you had any urgent prescriptions sent in today, please check with the pharmacy within an hour of our visit to make sure the prescription was transmitted appropriately.   Please try these tips to maintain a healthy lifestyle:  Eat at least 3 REAL meals and 1-2 snacks per day.  Aim for no more than 5 hours between eating.  If you eat breakfast, please do so within one hour of getting up.   Each meal should contain half fruits/vegetables, one quarter protein, and one quarter carbs (no bigger than a computer mouse)  Cut down on sweet beverages. This includes juice, soda, and sweet tea.   Drink at least 1 glass of water with each meal and aim for at least 8 glasses per day  Exercise at least 150 minutes every week.    Preventive Care 32 Years and Older, Female Preventive care refers to lifestyle choices and visits with your health care provider that can promote health and wellness. Preventive care visits are also called wellness exams. What can I expect for my preventive care visit? Counseling Your health care provider may ask you questions about your: Medical history, including: Past medical problems. Family medical history. Pregnancy and menstrual history. History of falls. Current health, including: Memory and ability to understand (cognition). Emotional well-being. Home life and relationship  well-being. Sexual activity and sexual health. Lifestyle, including: Alcohol, nicotine or tobacco, and drug use. Access to firearms. Diet, exercise, and sleep habits. Work and work Astronomer. Sunscreen use. Safety issues such as seatbelt and bike helmet use. Physical exam Your health care provider will check your: Height and weight. These may be used to calculate your BMI (body mass index). BMI is a measurement that tells if you are at a healthy weight. Waist circumference. This measures the distance around your waistline. This measurement also tells if you are at a healthy weight and may help predict your risk of certain diseases, such as type 2 diabetes and high blood pressure. Heart rate and blood pressure. Body temperature. Skin for abnormal spots. What immunizations do I need?  Vaccines are usually given at various ages, according to a schedule. Your health care provider will recommend vaccines for you based on your age, medical history, and lifestyle or other factors, such as travel or where you work. What tests do I need? Screening Your health care provider may recommend screening tests for certain conditions. This may include: Lipid and cholesterol levels. Hepatitis C test. Hepatitis B test. HIV (human immunodeficiency virus) test. STI (sexually transmitted infection) testing, if you are at risk. Lung cancer screening. Colorectal cancer screening. Diabetes screening. This is done by checking your blood sugar (glucose) after you have not eaten for a while (fasting). Mammogram. Talk with your health care provider about how often  you should have regular mammograms. BRCA-related cancer screening. This may be done if you have a family history of breast, ovarian, tubal, or peritoneal cancers. Bone density scan. This is done to screen for osteoporosis. Talk with your health care provider about your test results, treatment options, and if necessary, the need for more tests. Follow  these instructions at home: Eating and drinking  Eat a diet that includes fresh fruits and vegetables, whole grains, lean protein, and low-fat dairy products. Limit your intake of foods with high amounts of sugar, saturated fats, and salt. Take vitamin and mineral supplements as recommended by your health care provider. Do not drink alcohol if your health care provider tells you not to drink. If you drink alcohol: Limit how much you have to 0-1 drink a day. Know how much alcohol is in your drink. In the [place] one drink equals one 12 oz bottle of beer (355 mL), one 5 oz glass of wine (148 mL), or one 1 oz glass of hard liquor (44 mL). Lifestyle Brush your teeth every morning and night with fluoride toothpaste. Floss one time each day. Exercise for at least 30 minutes 5 or more days each week. Do not use any products that contain nicotine or tobacco. These products include cigarettes, chewing tobacco, and vaping devices, such as e-cigarettes. If you need help quitting, ask your health care provider. Do not use drugs. If you are sexually active, practice safe sex. Use a condom or other form of protection in order to prevent STIs. Take aspirin only as told by your health care provider. Make sure that you understand how much to take and what form to take. Work with your health care provider to find out whether it is safe and beneficial for you to take aspirin daily. Ask your health care provider if you need to take a cholesterol-lowering medicine (statin). Find healthy ways to manage stress, such as: Meditation, yoga, or listening to music. Journaling. Talking to a trusted person. Spending time with friends and family. Minimize exposure to UV radiation to reduce your risk of skin cancer. Safety Always wear your seat belt while driving or riding in a vehicle. Do not drive: If you have been drinking alcohol. Do not ride with someone who has been drinking. When you are tired or distracted. While  texting. If you have been using any mind-altering substances or drugs. Wear a helmet and other protective equipment during sports activities. If you have firearms in your house, make sure you follow all gun safety procedures. What's next? Visit your health care provider once a year for an annual wellness visit. Ask your health care provider how often you should have your eyes and teeth checked. Stay up to date on all vaccines. This information is not intended to replace advice given to you by your health care provider. Make sure you discuss any questions you have with your health care provider. Document Revised: 10/16/2020 Document Reviewed: 10/16/2020 Elsevier Patient Education  2024 [employer].

## 2023-02-22 NOTE — Progress Notes (Signed)
Carelink Summary Report / Loop Recorder 

## 2023-02-23 ENCOUNTER — Telehealth: Payer: Self-pay

## 2023-02-23 NOTE — Telephone Encounter (Signed)
The patient called to let us know she is moving to [place]. I told her once she see an EP doctor there to call us so we can release her in [facility]. The patient agreed.

## 2023-02-24 ENCOUNTER — Other Ambulatory Visit: Payer: Self-pay | Admitting: *Deleted

## 2023-02-24 DIAGNOSIS — E876 Hypokalemia: Secondary | ICD-10-CM

## 2023-02-24 NOTE — Progress Notes (Signed)
Her potassium is little bit low but the rest of her labs are all stable.  This may be a lab error or it could be from her lasix. Recommend she try to increase her potassium intake via potassium rich foods such as banana, citrus, spinach etc.  Please have her come back in 1 to 2 weeks to recheck be met.  We can recheck everything else in a year.

## 2023-03-10 LAB — CUP PACEART REMOTE DEVICE CHECK
Date Time Interrogation Session: 20241102230148
Implantable Pulse Generator Implant Date: 20220218

## 2023-03-15 ENCOUNTER — Ambulatory Visit: Payer: Medicare PPO

## 2023-03-15 DIAGNOSIS — I480 Paroxysmal atrial fibrillation: Secondary | ICD-10-CM | POA: Diagnosis not present

## 2023-03-18 ENCOUNTER — Other Ambulatory Visit: Payer: Self-pay

## 2023-03-18 MED ORDER — METOPROLOL SUCCINATE ER 25 MG PO TB24: 25.0000 mg | ORAL_TABLET | Freq: Every day | ORAL | 2 refills | Status: DC

## 2023-03-23 ENCOUNTER — Telehealth: Payer: Self-pay | Admitting: Cardiology

## 2023-03-23 ENCOUNTER — Other Ambulatory Visit: Payer: Self-pay

## 2023-03-23 ENCOUNTER — Other Ambulatory Visit: Payer: Self-pay | Admitting: Pharmacist

## 2023-03-23 DIAGNOSIS — I480 Paroxysmal atrial fibrillation: Secondary | ICD-10-CM

## 2023-03-23 MED ORDER — APIXABAN 5 MG PO TABS: 5.0000 mg | ORAL_TABLET | Freq: Two times a day (BID) | ORAL | 5 refills | Status: AC

## 2023-03-23 NOTE — Telephone Encounter (Signed)
*  STAT* If patient is at the pharmacy, call can be transferred to refill team.   1. Which medications need to be refilled? (please list name of each medication and dose if known)   apixaban (ELIQUIS) 5 MG TABS tablet  metoprolol succinate (TOPROL-XL) 25 MG 24 hr tablet   2. Which pharmacy/location (including street and city if local pharmacy) is medication to be sent to? [id number] [place], [place] - [address] AT [facility] [address] Phone: [phone]  Fax: [phone]     3. Do they need a 30 day or 90 day supply? 90 Pt has move to [place]. Has appt with new doctor there in Jan.

## 2023-04-08 NOTE — Progress Notes (Signed)
Carelink Summary Report / Loop Recorder 

## 2023-04-09 ENCOUNTER — Ambulatory Visit (INDEPENDENT_AMBULATORY_CARE_PROVIDER_SITE_OTHER): Payer: Medicare PPO

## 2023-04-09 DIAGNOSIS — I480 Paroxysmal atrial fibrillation: Secondary | ICD-10-CM

## 2023-04-09 LAB — CUP PACEART REMOTE DEVICE CHECK
Date Time Interrogation Session: 20241205230453
Implantable Pulse Generator Implant Date: 20220218

## 2023-04-19 ENCOUNTER — Ambulatory Visit: Payer: Medicare PPO

## 2023-05-03 ENCOUNTER — Ambulatory Visit: Payer: Medicare PPO | Admitting: Student

## 2023-05-14 ENCOUNTER — Ambulatory Visit: Payer: Medicare Other

## 2023-05-14 DIAGNOSIS — I480 Paroxysmal atrial fibrillation: Secondary | ICD-10-CM

## 2023-05-14 LAB — CUP PACEART REMOTE DEVICE CHECK
Date Time Interrogation Session: 20250109230422
Implantable Pulse Generator Implant Date: 20220218

## 2023-05-24 ENCOUNTER — Ambulatory Visit: Payer: Medicare PPO

## 2023-06-07 ENCOUNTER — Telehealth: Payer: Self-pay | Admitting: Nurse Practitioner

## 2023-06-07 NOTE — Telephone Encounter (Signed)
Patient needs Cpap Prescription to [facility].

## 2023-06-07 NOTE — Telephone Encounter (Signed)
Patient needs a referral to be sent to [facility] in [place]. Fax number [phone] ext: [phone] Attention: [facility] . Medical records also need to be sent there as well.

## 2023-06-08 ENCOUNTER — Telehealth: Payer: Self-pay | Admitting: Family Medicine

## 2023-06-08 NOTE — Telephone Encounter (Signed)
Patient has been notified that she needs to contact her PCP to get a referral. A medical release form was also mailed off to patient yesterday.

## 2023-06-08 NOTE — Telephone Encounter (Signed)
 Noted. Thanks.

## 2023-06-08 NOTE — Telephone Encounter (Signed)
 Lm for patient.

## 2023-06-08 NOTE — Telephone Encounter (Signed)
Spoke with patient notified unable to send referral to other stated  Patient stated move to [facility] in November 2024

## 2023-06-08 NOTE — Telephone Encounter (Signed)
Lm for patient. Please schedule OV.

## 2023-06-08 NOTE — Telephone Encounter (Signed)
Called and spoke to patient. She is requesting a Rx for new cpap machine.  Currently machine is >10y.  [id number]  Katie, please advise.

## 2023-06-08 NOTE — Telephone Encounter (Signed)
Patient has moved to [place]. Patient is returning phone call. Patient phone number is [phone].

## 2023-06-08 NOTE — Telephone Encounter (Signed)
She'll need an OV since it's been >6 months since she was seen last. Needs to bring SD card to visit.

## 2023-06-08 NOTE — Telephone Encounter (Unsigned)
 Copied from CRM [id number]. Topic: Referral - Request for Referral >> Jun 08, 2023  3:08 PM Corin V wrote: Did the patient discuss referral with their provider in the last year? Yes (If No - schedule appointment) (If Yes - send message)  Appointment offered? No-[place] out [place].  Type of order/referral and detailed reason for visit: Sleep Medicine Specialist. She has not seen her previous sleep medicine provider in over 6 months and they recommended Dr. Kennyth place the referral so she can get established with a new provider in her new town.  Preference of office, provider, location: [facility], [address], [address], [place], [place] [address] P: [phone] F: [phone] (for sleep specialist) Shell, ext. [phone]  If referral order, have you been seen by this specialty before? Yes (If Yes, this issue or another issue? When? Where?  Can we respond through MyChart? No

## 2023-06-09 NOTE — Telephone Encounter (Signed)
 Patient has moved. She is working with sleep center there. No appt needed.

## 2023-06-18 ENCOUNTER — Ambulatory Visit: Payer: Medicare PPO

## 2023-06-18 NOTE — Progress Notes (Signed)
Carelink Summary Report / Loop Recorder

## 2023-06-28 ENCOUNTER — Ambulatory Visit: Payer: Medicare PPO

## 2023-07-23 ENCOUNTER — Ambulatory Visit: Payer: Medicare PPO

## 2023-08-02 ENCOUNTER — Ambulatory Visit: Payer: Medicare PPO

## 2023-08-26 ENCOUNTER — Other Ambulatory Visit: Payer: Self-pay | Admitting: Cardiology

## 2023-08-26 DIAGNOSIS — I480 Paroxysmal atrial fibrillation: Secondary | ICD-10-CM

## 2023-08-26 NOTE — Telephone Encounter (Addendum)
 Eliquis  5mg  refill request received. Patient is 83 years old, weight-93.8kg, Crea-0.87 on 02/18/23, Diagnosis-Afib, and last seen by Michaelle Adolphus on 10/26/22. Dose is appropriate based on dosing criteria.   Pt last seen by [facility] on 05/18/23 & new address is in [place] . Will call to confirm.  Pt now lives in [place] .    Spoke with Pharmacy and they state the refill has been refilled under current cardiologist in [place] .

## 2023-08-27 ENCOUNTER — Ambulatory Visit: Payer: Medicare PPO

## 2023-09-06 ENCOUNTER — Ambulatory Visit: Payer: Medicare PPO

## 2023-10-01 ENCOUNTER — Ambulatory Visit: Payer: Medicare PPO

## 2023-10-11 ENCOUNTER — Ambulatory Visit: Payer: Medicare PPO

## 2023-11-08 ENCOUNTER — Ambulatory Visit: Payer: Medicare PPO

## 2023-11-15 ENCOUNTER — Ambulatory Visit: Payer: Medicare PPO

## 2023-12-10 ENCOUNTER — Ambulatory Visit: Payer: Medicare PPO

## 2023-12-20 ENCOUNTER — Ambulatory Visit: Payer: Medicare PPO

## 2023-12-21 ENCOUNTER — Other Ambulatory Visit: Payer: Self-pay | Admitting: Student

## 2024-01-14 ENCOUNTER — Ambulatory Visit: Payer: Medicare PPO

## 2024-02-18 ENCOUNTER — Ambulatory Visit: Payer: Medicare PPO

## 2024-03-24 ENCOUNTER — Ambulatory Visit: Payer: Medicare PPO

## 2024-04-28 ENCOUNTER — Ambulatory Visit: Payer: Medicare PPO
